# Patient Record
Sex: Female | Born: 1945
Health system: Southern US, Academic
[De-identification: ages and names within clinical notes are randomized; demographics above are authoritative.]

## PROBLEM LIST (undated history)

## (undated) ENCOUNTER — Encounter

## (undated) ENCOUNTER — Telehealth

## (undated) DIAGNOSIS — J449 Chronic obstructive pulmonary disease, unspecified: Secondary | ICD-10-CM

## (undated) DIAGNOSIS — F3289 Other specified depressive episodes: Secondary | ICD-10-CM

## (undated) DIAGNOSIS — Z972 Presence of dental prosthetic device (complete) (partial): Secondary | ICD-10-CM

## (undated) DIAGNOSIS — E785 Hyperlipidemia, unspecified: Secondary | ICD-10-CM

## (undated) DIAGNOSIS — I5032 Chronic diastolic (congestive) heart failure: Secondary | ICD-10-CM

## (undated) DIAGNOSIS — K219 Gastro-esophageal reflux disease without esophagitis: Secondary | ICD-10-CM

## (undated) DIAGNOSIS — F411 Generalized anxiety disorder: Secondary | ICD-10-CM

## (undated) DIAGNOSIS — G4754 Parasomnia in conditions classified elsewhere: Secondary | ICD-10-CM

## (undated) DIAGNOSIS — M25839 Other specified joint disorders, unspecified wrist: Secondary | ICD-10-CM

## (undated) DIAGNOSIS — K5790 Diverticulosis of intestine, part unspecified, without perforation or abscess without bleeding: Secondary | ICD-10-CM

## (undated) DIAGNOSIS — G4730 Sleep apnea, unspecified: Secondary | ICD-10-CM

## (undated) DIAGNOSIS — I951 Orthostatic hypotension: Principal | ICD-10-CM

## (undated) DIAGNOSIS — F329 Major depressive disorder, single episode, unspecified: Secondary | ICD-10-CM

## (undated) DIAGNOSIS — R296 Repeated falls: Secondary | ICD-10-CM

## (undated) DIAGNOSIS — E1140 Type 2 diabetes mellitus with diabetic neuropathy, unspecified: Secondary | ICD-10-CM

## (undated) DIAGNOSIS — R569 Unspecified convulsions: Secondary | ICD-10-CM

## (undated) DIAGNOSIS — G5601 Carpal tunnel syndrome, right upper limb: Secondary | ICD-10-CM

## (undated) DIAGNOSIS — F1911 Other psychoactive substance abuse, in remission: Secondary | ICD-10-CM

## (undated) DIAGNOSIS — I10 Essential (primary) hypertension: Secondary | ICD-10-CM

## (undated) DIAGNOSIS — E119 Type 2 diabetes mellitus without complications: Secondary | ICD-10-CM

## (undated) HISTORY — DX: Major depressive disorder, single episode, unspecified: F32.9

## (undated) HISTORY — DX: Generalized anxiety disorder: F41.1

## (undated) HISTORY — DX: Other specified depressive episodes: F32.89

## (undated) HISTORY — PX: HEMORRHOID SURGERY: SHX153

## (undated) HISTORY — DX: Parasomnia in conditions classified elsewhere: G47.54

## (undated) HISTORY — DX: Type 2 diabetes mellitus without complications: E11.9

## (undated) HISTORY — DX: Unspecified convulsions: R56.9

## (undated) HISTORY — DX: Chronic diastolic (congestive) heart failure: I50.32

## (undated) HISTORY — DX: Essential (primary) hypertension: I10

## (undated) HISTORY — DX: Gastro-esophageal reflux disease without esophagitis: K21.9

## (undated) HISTORY — PX: VAGINAL HYSTERECTOMY: SUR661

---

## 1969-07-23 HISTORY — PX: APPENDECTOMY: SHX54

## 1999-11-23 DIAGNOSIS — E119 Type 2 diabetes mellitus without complications: Secondary | ICD-10-CM

## 1999-11-23 HISTORY — DX: Type 2 diabetes mellitus without complications: E11.9

## 2000-08-16 ENCOUNTER — Encounter: Admission: RE | Admit: 2000-08-16 | Discharge: 2000-11-14 | Payer: Self-pay | Admitting: Family Medicine

## 2000-10-26 ENCOUNTER — Encounter: Payer: Self-pay | Admitting: Surgery

## 2000-10-26 ENCOUNTER — Observation Stay (HOSPITAL_COMMUNITY): Admission: RE | Admit: 2000-10-26 | Discharge: 2000-10-26 | Payer: Self-pay | Admitting: Surgery

## 2000-10-26 ENCOUNTER — Encounter (INDEPENDENT_AMBULATORY_CARE_PROVIDER_SITE_OTHER): Payer: Self-pay | Admitting: Specialist

## 2000-10-26 HISTORY — PX: CHOLECYSTECTOMY: SHX55

## 2002-08-03 ENCOUNTER — Emergency Department (HOSPITAL_COMMUNITY): Admission: EM | Admit: 2002-08-03 | Discharge: 2002-08-04 | Payer: Self-pay | Admitting: Emergency Medicine

## 2002-08-03 ENCOUNTER — Encounter: Payer: Self-pay | Admitting: Emergency Medicine

## 2002-08-04 ENCOUNTER — Encounter: Payer: Self-pay | Admitting: Emergency Medicine

## 2002-08-08 ENCOUNTER — Encounter (INDEPENDENT_AMBULATORY_CARE_PROVIDER_SITE_OTHER): Payer: Self-pay | Admitting: *Deleted

## 2002-08-08 ENCOUNTER — Ambulatory Visit (HOSPITAL_BASED_OUTPATIENT_CLINIC_OR_DEPARTMENT_OTHER): Admission: RE | Admit: 2002-08-08 | Discharge: 2002-08-08 | Payer: Self-pay | Admitting: Surgery

## 2003-05-02 ENCOUNTER — Encounter: Payer: Self-pay | Admitting: Internal Medicine

## 2003-06-13 ENCOUNTER — Encounter: Payer: Self-pay | Admitting: Internal Medicine

## 2004-01-30 ENCOUNTER — Encounter: Admission: RE | Admit: 2004-01-30 | Discharge: 2004-01-30 | Payer: Self-pay | Admitting: Infectious Diseases

## 2004-02-13 ENCOUNTER — Encounter: Admission: RE | Admit: 2004-02-13 | Discharge: 2004-02-13 | Payer: Self-pay | Admitting: Infectious Diseases

## 2004-02-26 ENCOUNTER — Encounter: Admission: RE | Admit: 2004-02-26 | Discharge: 2004-02-26 | Payer: Self-pay | Admitting: Infectious Diseases

## 2004-05-20 ENCOUNTER — Other Ambulatory Visit (HOSPITAL_COMMUNITY): Admission: RE | Admit: 2004-05-20 | Discharge: 2004-06-18 | Payer: Self-pay | Admitting: Psychiatry

## 2004-08-16 ENCOUNTER — Emergency Department (HOSPITAL_COMMUNITY): Admission: EM | Admit: 2004-08-16 | Discharge: 2004-08-16 | Payer: Self-pay | Admitting: Emergency Medicine

## 2004-08-31 ENCOUNTER — Ambulatory Visit (HOSPITAL_BASED_OUTPATIENT_CLINIC_OR_DEPARTMENT_OTHER): Admission: RE | Admit: 2004-08-31 | Discharge: 2004-08-31 | Payer: Self-pay | Admitting: Emergency Medicine

## 2007-04-06 ENCOUNTER — Encounter: Payer: Self-pay | Admitting: Internal Medicine

## 2007-07-02 ENCOUNTER — Encounter (INDEPENDENT_AMBULATORY_CARE_PROVIDER_SITE_OTHER): Payer: Self-pay | Admitting: *Deleted

## 2007-08-09 ENCOUNTER — Ambulatory Visit: Payer: Self-pay | Admitting: Internal Medicine

## 2007-08-09 DIAGNOSIS — F1011 Alcohol abuse, in remission: Secondary | ICD-10-CM | POA: Insufficient documentation

## 2007-08-09 DIAGNOSIS — R05 Cough: Secondary | ICD-10-CM

## 2007-08-09 DIAGNOSIS — E785 Hyperlipidemia, unspecified: Secondary | ICD-10-CM | POA: Insufficient documentation

## 2007-08-09 DIAGNOSIS — R569 Unspecified convulsions: Secondary | ICD-10-CM

## 2007-08-09 DIAGNOSIS — E119 Type 2 diabetes mellitus without complications: Secondary | ICD-10-CM | POA: Insufficient documentation

## 2007-08-09 DIAGNOSIS — K219 Gastro-esophageal reflux disease without esophagitis: Secondary | ICD-10-CM

## 2007-08-09 DIAGNOSIS — F329 Major depressive disorder, single episode, unspecified: Secondary | ICD-10-CM | POA: Insufficient documentation

## 2007-08-09 DIAGNOSIS — F17200 Nicotine dependence, unspecified, uncomplicated: Secondary | ICD-10-CM | POA: Insufficient documentation

## 2007-08-09 DIAGNOSIS — Z8719 Personal history of other diseases of the digestive system: Secondary | ICD-10-CM | POA: Insufficient documentation

## 2007-08-09 DIAGNOSIS — R030 Elevated blood-pressure reading, without diagnosis of hypertension: Secondary | ICD-10-CM

## 2007-08-09 DIAGNOSIS — J309 Allergic rhinitis, unspecified: Secondary | ICD-10-CM | POA: Insufficient documentation

## 2007-08-21 ENCOUNTER — Encounter: Payer: Self-pay | Admitting: Internal Medicine

## 2007-08-23 ENCOUNTER — Telehealth: Payer: Self-pay | Admitting: Internal Medicine

## 2007-08-23 ENCOUNTER — Telehealth: Payer: Self-pay | Admitting: *Deleted

## 2007-08-24 ENCOUNTER — Ambulatory Visit: Payer: Self-pay | Admitting: Internal Medicine

## 2007-09-04 LAB — CONVERTED CEMR LAB
ALT: 27 U/L (ref 0–35)
AST: 45 U/L — ABNORMAL HIGH (ref 0–37)
Albumin: 3.5 g/dL (ref 3.5–5.2)
Alkaline Phosphatase: 135 U/L — ABNORMAL HIGH (ref 39–117)
BUN: 8 mg/dL (ref 6–23)
Basophils Absolute: 0.1 K/uL (ref 0.0–0.1)
Basophils Relative: 0.7 % (ref 0.0–1.0)
Bilirubin, Direct: 0.2 mg/dL (ref 0.0–0.3)
CO2: 27 meq/L (ref 19–32)
Calcium: 9.4 mg/dL (ref 8.4–10.5)
Chloride: 104 meq/L (ref 96–112)
Cholesterol: 256 mg/dL (ref 0–200)
Creatinine, Ser: 0.8 mg/dL (ref 0.4–1.2)
Direct LDL: 186.1 mg/dL
Eosinophils Absolute: 0.2 K/uL (ref 0.0–0.6)
Eosinophils Relative: 1.2 % (ref 0.0–5.0)
GFR calc Af Amer: 93 mL/min
GFR calc non Af Amer: 77 mL/min
Glucose, Bld: 200 mg/dL — ABNORMAL HIGH (ref 70–99)
HCT: 36.6 % (ref 36.0–46.0)
HDL: 36.6 mg/dL — ABNORMAL LOW (ref >39.0)
Hemoglobin: 12.6 g/dL (ref 12.0–15.0)
Hgb A1c MFr Bld: 9.3 % — ABNORMAL HIGH (ref 4.6–6.0)
Lymphocytes Relative: 24.2 % (ref 12.0–46.0)
MCHC: 34.5 g/dL (ref 30.0–36.0)
MCV: 83.9 fL (ref 78.0–100.0)
Monocytes Absolute: 0.7 K/uL (ref 0.2–0.7)
Monocytes Relative: 4.8 % (ref 3.0–11.0)
Neutro Abs: 9.7 K/uL — ABNORMAL HIGH (ref 1.4–7.7)
Neutrophils Relative %: 69.1 % (ref 43.0–77.0)
Platelets: 334 K/uL (ref 150–400)
Potassium: 4.3 meq/L (ref 3.5–5.1)
RBC: 4.36 M/uL (ref 3.87–5.11)
RDW: 14.2 % (ref 11.5–14.6)
Sodium: 142 meq/L (ref 135–145)
TSH: 1.19 u[IU]/mL (ref 0.35–5.50)
Total Bilirubin: 0.7 mg/dL (ref 0.3–1.2)
Total CHOL/HDL Ratio: 7
Total Protein: 6.5 g/dL (ref 6.0–8.3)
Triglycerides: 254 mg/dL (ref 0–149)
VLDL: 51 mg/dL — ABNORMAL HIGH (ref 0–40)
WBC: 14.1 10*3/microliter — ABNORMAL HIGH (ref 4.5–10.5)

## 2007-09-07 ENCOUNTER — Ambulatory Visit: Payer: Self-pay | Admitting: Internal Medicine

## 2007-09-07 DIAGNOSIS — I10 Essential (primary) hypertension: Secondary | ICD-10-CM | POA: Insufficient documentation

## 2007-09-08 ENCOUNTER — Telehealth: Payer: Self-pay | Admitting: *Deleted

## 2007-09-11 ENCOUNTER — Encounter: Payer: Self-pay | Admitting: Internal Medicine

## 2007-09-12 ENCOUNTER — Telehealth: Payer: Self-pay | Admitting: *Deleted

## 2007-09-15 ENCOUNTER — Telehealth: Payer: Self-pay | Admitting: Internal Medicine

## 2007-09-20 ENCOUNTER — Ambulatory Visit: Payer: Self-pay | Admitting: Internal Medicine

## 2007-10-03 ENCOUNTER — Telehealth: Payer: Self-pay | Admitting: *Deleted

## 2007-10-18 ENCOUNTER — Encounter: Payer: Self-pay | Admitting: Internal Medicine

## 2007-10-24 ENCOUNTER — Encounter: Payer: Self-pay | Admitting: Internal Medicine

## 2007-11-11 ENCOUNTER — Ambulatory Visit: Payer: Self-pay | Admitting: Internal Medicine

## 2007-12-04 ENCOUNTER — Encounter: Payer: Self-pay | Admitting: Internal Medicine

## 2007-12-05 ENCOUNTER — Telehealth: Payer: Self-pay | Admitting: Internal Medicine

## 2007-12-14 ENCOUNTER — Ambulatory Visit: Payer: Self-pay | Admitting: Internal Medicine

## 2007-12-17 LAB — CONVERTED CEMR LAB
HDL: 35.4 mg/dL — ABNORMAL LOW (ref >39.0)
Hgb A1c MFr Bld: 9.5 % — ABNORMAL HIGH (ref 4.6–6.0)
VLDL: 31 mg/dL (ref 0–40)

## 2007-12-19 ENCOUNTER — Telehealth: Payer: Self-pay | Admitting: Internal Medicine

## 2007-12-20 ENCOUNTER — Telehealth: Payer: Self-pay | Admitting: Internal Medicine

## 2008-01-03 ENCOUNTER — Telehealth: Payer: Self-pay | Admitting: Internal Medicine

## 2008-01-05 ENCOUNTER — Telehealth: Payer: Self-pay | Admitting: Internal Medicine

## 2008-01-09 ENCOUNTER — Telehealth: Payer: Self-pay | Admitting: Internal Medicine

## 2008-02-06 ENCOUNTER — Encounter: Payer: Self-pay | Admitting: Internal Medicine

## 2008-02-22 ENCOUNTER — Ambulatory Visit: Payer: Self-pay | Admitting: Internal Medicine

## 2008-02-22 DIAGNOSIS — J4489 Other specified chronic obstructive pulmonary disease: Secondary | ICD-10-CM | POA: Insufficient documentation

## 2008-02-22 DIAGNOSIS — R5381 Other malaise: Secondary | ICD-10-CM

## 2008-02-22 DIAGNOSIS — J449 Chronic obstructive pulmonary disease, unspecified: Secondary | ICD-10-CM

## 2008-02-22 DIAGNOSIS — R5383 Other fatigue: Secondary | ICD-10-CM

## 2008-03-04 ENCOUNTER — Telehealth: Payer: Self-pay | Admitting: *Deleted

## 2008-03-05 ENCOUNTER — Ambulatory Visit: Payer: Self-pay | Admitting: Internal Medicine

## 2008-03-05 LAB — CONVERTED CEMR LAB
BUN: 9 mg/dL (ref 6–23)
Basophils Relative: 0.7 % (ref 0.0–1.0)
CO2: 29 meq/L (ref 19–32)
Chloride: 99 meq/L (ref 96–112)
Creatinine, Ser: 0.7 mg/dL (ref 0.4–1.2)
Eosinophils Relative: 1.5 % (ref 0.0–5.0)
Glucose, Bld: 234 mg/dL — ABNORMAL HIGH (ref 70–99)
Lymphocytes Relative: 30.8 % (ref 12.0–46.0)
MCV: 84.9 fL (ref 78.0–100.0)
Neutrophils Relative %: 62.9 % (ref 43.0–77.0)
RBC: 4.77 M/uL (ref 3.87–5.11)
WBC: 14.9 10*3/uL — ABNORMAL HIGH (ref 4.5–10.5)

## 2008-03-25 ENCOUNTER — Telehealth: Payer: Self-pay | Admitting: *Deleted

## 2008-03-27 ENCOUNTER — Telehealth: Payer: Self-pay | Admitting: *Deleted

## 2008-03-29 ENCOUNTER — Telehealth: Payer: Self-pay | Admitting: *Deleted

## 2008-04-01 ENCOUNTER — Telehealth: Payer: Self-pay | Admitting: *Deleted

## 2008-04-03 ENCOUNTER — Telehealth: Payer: Self-pay | Admitting: *Deleted

## 2008-04-08 ENCOUNTER — Telehealth: Payer: Self-pay | Admitting: Internal Medicine

## 2008-04-25 ENCOUNTER — Telehealth: Payer: Self-pay | Admitting: *Deleted

## 2008-05-30 ENCOUNTER — Telehealth: Payer: Self-pay | Admitting: *Deleted

## 2008-06-20 ENCOUNTER — Telehealth: Payer: Self-pay | Admitting: *Deleted

## 2008-06-24 ENCOUNTER — Telehealth: Payer: Self-pay | Admitting: *Deleted

## 2008-07-10 ENCOUNTER — Ambulatory Visit: Payer: Self-pay | Admitting: Family Medicine

## 2008-07-11 ENCOUNTER — Encounter: Payer: Self-pay | Admitting: Internal Medicine

## 2008-07-15 ENCOUNTER — Telehealth: Payer: Self-pay | Admitting: *Deleted

## 2008-07-16 ENCOUNTER — Telehealth: Payer: Self-pay | Admitting: Internal Medicine

## 2008-07-17 ENCOUNTER — Ambulatory Visit: Payer: Self-pay | Admitting: Internal Medicine

## 2008-07-18 ENCOUNTER — Telehealth: Payer: Self-pay | Admitting: *Deleted

## 2008-07-19 LAB — CONVERTED CEMR LAB
Basophils Absolute: 0.1 10*3/uL (ref 0.0–0.1)
Bilirubin, Direct: 0.1 mg/dL (ref 0.0–0.3)
Calcium: 8.9 mg/dL (ref 8.4–10.5)
Creatinine, Ser: 0.7 mg/dL (ref 0.4–1.2)
GFR calc Af Amer: 109 mL/min
Glucose, Bld: 281 mg/dL — ABNORMAL HIGH (ref 70–99)
HCT: 35.7 % — ABNORMAL LOW (ref 36.0–46.0)
Hemoglobin: 12.1 g/dL (ref 12.0–15.0)
MCHC: 33.8 g/dL (ref 30.0–36.0)
Monocytes Absolute: 0.4 10*3/uL (ref 0.1–1.0)
Monocytes Relative: 3.3 % (ref 3.0–12.0)
Neutro Abs: 6.5 10*3/uL (ref 1.4–7.7)
RDW: 14 % (ref 11.5–14.6)
Sed Rate: 13 mm/hr (ref 0–22)
Sodium: 141 meq/L (ref 135–145)
Total Protein: 6.4 g/dL (ref 6.0–8.3)

## 2008-07-20 ENCOUNTER — Encounter: Payer: Self-pay | Admitting: Internal Medicine

## 2008-07-24 ENCOUNTER — Telehealth: Payer: Self-pay | Admitting: *Deleted

## 2008-07-26 ENCOUNTER — Ambulatory Visit: Payer: Self-pay | Admitting: Internal Medicine

## 2008-07-26 ENCOUNTER — Ambulatory Visit (HOSPITAL_COMMUNITY): Admission: RE | Admit: 2008-07-26 | Discharge: 2008-07-26 | Payer: Self-pay | Admitting: Internal Medicine

## 2008-07-26 ENCOUNTER — Telehealth: Payer: Self-pay | Admitting: Internal Medicine

## 2008-07-31 ENCOUNTER — Telehealth: Payer: Self-pay | Admitting: *Deleted

## 2008-07-31 ENCOUNTER — Telehealth: Payer: Self-pay | Admitting: Internal Medicine

## 2008-08-01 ENCOUNTER — Ambulatory Visit: Payer: Self-pay | Admitting: Internal Medicine

## 2008-08-01 ENCOUNTER — Telehealth (INDEPENDENT_AMBULATORY_CARE_PROVIDER_SITE_OTHER): Payer: Self-pay

## 2008-08-01 LAB — CONVERTED CEMR LAB
Crystals: NEGATIVE
Leukocytes, UA: NEGATIVE
Nitrite: NEGATIVE
Total Protein, Urine: NEGATIVE mg/dL
Urobilinogen, UA: 0.2 (ref 0.0–1.0)

## 2008-08-02 ENCOUNTER — Encounter: Payer: Self-pay | Admitting: Gastroenterology

## 2008-08-02 ENCOUNTER — Telehealth: Payer: Self-pay | Admitting: *Deleted

## 2008-08-05 ENCOUNTER — Ambulatory Visit (HOSPITAL_COMMUNITY): Admission: RE | Admit: 2008-08-05 | Discharge: 2008-08-05 | Payer: Self-pay | Admitting: Gastroenterology

## 2008-08-05 ENCOUNTER — Telehealth: Payer: Self-pay | Admitting: Internal Medicine

## 2008-08-06 ENCOUNTER — Encounter: Payer: Self-pay | Admitting: Gastroenterology

## 2008-08-21 ENCOUNTER — Ambulatory Visit: Payer: Self-pay | Admitting: Internal Medicine

## 2008-08-21 ENCOUNTER — Telehealth: Payer: Self-pay | Admitting: *Deleted

## 2008-08-28 ENCOUNTER — Telehealth: Payer: Self-pay | Admitting: *Deleted

## 2008-08-29 ENCOUNTER — Encounter: Payer: Self-pay | Admitting: Internal Medicine

## 2008-08-29 ENCOUNTER — Ambulatory Visit: Payer: Self-pay | Admitting: Internal Medicine

## 2008-08-29 LAB — HM COLONOSCOPY

## 2008-09-09 ENCOUNTER — Telehealth: Payer: Self-pay | Admitting: *Deleted

## 2008-09-09 ENCOUNTER — Encounter: Payer: Self-pay | Admitting: Internal Medicine

## 2008-09-30 ENCOUNTER — Telehealth: Payer: Self-pay | Admitting: Internal Medicine

## 2008-09-30 ENCOUNTER — Ambulatory Visit: Payer: Self-pay | Admitting: Internal Medicine

## 2008-09-30 DIAGNOSIS — R1084 Generalized abdominal pain: Secondary | ICD-10-CM | POA: Insufficient documentation

## 2008-09-30 DIAGNOSIS — K589 Irritable bowel syndrome without diarrhea: Secondary | ICD-10-CM | POA: Insufficient documentation

## 2008-10-03 ENCOUNTER — Encounter: Payer: Self-pay | Admitting: Internal Medicine

## 2008-10-04 ENCOUNTER — Encounter: Payer: Self-pay | Admitting: Internal Medicine

## 2008-10-07 ENCOUNTER — Ambulatory Visit: Payer: Self-pay | Admitting: Internal Medicine

## 2008-10-07 DIAGNOSIS — Z860100 Personal history of colon polyps, unspecified: Secondary | ICD-10-CM | POA: Insufficient documentation

## 2008-10-07 DIAGNOSIS — Z9283 Personal history of failed moderate sedation: Secondary | ICD-10-CM | POA: Insufficient documentation

## 2008-10-07 DIAGNOSIS — K5730 Diverticulosis of large intestine without perforation or abscess without bleeding: Secondary | ICD-10-CM | POA: Insufficient documentation

## 2008-10-08 LAB — CONVERTED CEMR LAB: 5-HIAA, 24 Hr Urine: 11.6 mg/(24.h) — ABNORMAL HIGH (ref <=6.0)

## 2008-10-10 ENCOUNTER — Telehealth: Payer: Self-pay | Admitting: Internal Medicine

## 2008-10-15 ENCOUNTER — Ambulatory Visit: Payer: Self-pay | Admitting: Internal Medicine

## 2008-10-15 ENCOUNTER — Encounter: Payer: Self-pay | Admitting: Internal Medicine

## 2008-10-22 ENCOUNTER — Telehealth: Payer: Self-pay | Admitting: Internal Medicine

## 2008-10-30 ENCOUNTER — Encounter (HOSPITAL_COMMUNITY): Admission: RE | Admit: 2008-10-30 | Discharge: 2008-11-21 | Payer: Self-pay | Admitting: Internal Medicine

## 2008-11-05 ENCOUNTER — Ambulatory Visit: Payer: Self-pay | Admitting: Internal Medicine

## 2008-11-08 ENCOUNTER — Encounter: Payer: Self-pay | Admitting: Internal Medicine

## 2008-11-08 LAB — CONVERTED CEMR LAB: 5-HIAA, 24 Hr Urine: 5.9 mg/(24.h) (ref <=6.0)

## 2008-11-18 ENCOUNTER — Telehealth: Payer: Self-pay | Admitting: Internal Medicine

## 2008-11-19 ENCOUNTER — Telehealth: Payer: Self-pay | Admitting: Internal Medicine

## 2008-11-19 ENCOUNTER — Ambulatory Visit: Payer: Self-pay | Admitting: Internal Medicine

## 2008-11-19 DIAGNOSIS — R112 Nausea with vomiting, unspecified: Secondary | ICD-10-CM | POA: Insufficient documentation

## 2008-11-19 DIAGNOSIS — R197 Diarrhea, unspecified: Secondary | ICD-10-CM | POA: Insufficient documentation

## 2008-11-20 LAB — CONVERTED CEMR LAB
Albumin: 3.7 g/dL (ref 3.5–5.2)
CO2: 29 meq/L (ref 19–32)
Calcium: 9.4 mg/dL (ref 8.4–10.5)
Chloride: 97 meq/L (ref 96–112)
GFR calc Af Amer: 160 mL/min
GFR calc non Af Amer: 132 mL/min
Glucose, Bld: 228 mg/dL — ABNORMAL HIGH (ref 70–99)
Hemoglobin: 13 g/dL (ref 12.0–15.0)
Lymphocytes Relative: 39.5 % (ref 12.0–46.0)
Monocytes Relative: 4.7 % (ref 3.0–12.0)
Neutro Abs: 6.7 10*3/uL (ref 1.4–7.7)
Neutrophils Relative %: 53.8 % (ref 43.0–77.0)
RBC: 4.69 M/uL (ref 3.87–5.11)
RDW: 13.9 % (ref 11.5–14.6)
Sodium: 137 meq/L (ref 135–145)
Total Bilirubin: 0.5 mg/dL (ref 0.3–1.2)
Total Protein: 6.7 g/dL (ref 6.0–8.3)

## 2008-11-27 ENCOUNTER — Telehealth: Payer: Self-pay | Admitting: Internal Medicine

## 2008-11-28 ENCOUNTER — Ambulatory Visit (HOSPITAL_COMMUNITY): Admission: RE | Admit: 2008-11-28 | Discharge: 2008-11-28 | Payer: Self-pay | Admitting: Internal Medicine

## 2008-12-02 ENCOUNTER — Ambulatory Visit: Payer: Self-pay | Admitting: Internal Medicine

## 2008-12-02 LAB — CONVERTED CEMR LAB
Basophils Absolute: 0.1 10*3/uL (ref 0.0–0.1)
CRP, High Sensitivity: 13 — ABNORMAL HIGH (ref 0.00–5.00)
Eosinophils Absolute: 0.2 10*3/uL (ref 0.0–0.7)
HCT: 38.1 % (ref 36.0–46.0)
MCHC: 33.6 g/dL (ref 30.0–36.0)
MCV: 84.3 fL (ref 78.0–100.0)
Monocytes Absolute: 0.6 10*3/uL (ref 0.1–1.0)
Neutrophils Relative %: 65 % (ref 43.0–77.0)
Platelets: 291 10*3/uL (ref 150–400)
RDW: 14 % (ref 11.5–14.6)
WBC: 12 10*3/uL — ABNORMAL HIGH (ref 4.5–10.5)

## 2008-12-03 ENCOUNTER — Ambulatory Visit: Payer: Self-pay | Admitting: Internal Medicine

## 2008-12-17 ENCOUNTER — Telehealth: Payer: Self-pay | Admitting: Internal Medicine

## 2008-12-17 ENCOUNTER — Emergency Department (HOSPITAL_COMMUNITY): Admission: EM | Admit: 2008-12-17 | Discharge: 2008-12-17 | Payer: Self-pay | Admitting: Emergency Medicine

## 2008-12-18 ENCOUNTER — Ambulatory Visit: Payer: Self-pay | Admitting: Internal Medicine

## 2008-12-18 ENCOUNTER — Telehealth: Payer: Self-pay | Admitting: Internal Medicine

## 2008-12-18 DIAGNOSIS — F1910 Other psychoactive substance abuse, uncomplicated: Secondary | ICD-10-CM

## 2008-12-18 LAB — CONVERTED CEMR LAB: CRP, High Sensitivity: 18 — ABNORMAL HIGH (ref 0.00–5.00)

## 2008-12-19 ENCOUNTER — Telehealth: Payer: Self-pay | Admitting: *Deleted

## 2008-12-23 ENCOUNTER — Ambulatory Visit: Payer: Self-pay | Admitting: Cardiology

## 2008-12-27 ENCOUNTER — Telehealth: Payer: Self-pay | Admitting: Internal Medicine

## 2008-12-30 ENCOUNTER — Telehealth: Payer: Self-pay | Admitting: Internal Medicine

## 2008-12-31 ENCOUNTER — Encounter: Payer: Self-pay | Admitting: Internal Medicine

## 2008-12-31 ENCOUNTER — Telehealth: Payer: Self-pay | Admitting: Internal Medicine

## 2008-12-31 ENCOUNTER — Ambulatory Visit: Payer: Self-pay | Admitting: Internal Medicine

## 2008-12-31 LAB — CONVERTED CEMR LAB: Blood Glucose, Fingerstick: 241

## 2009-01-06 ENCOUNTER — Telehealth: Payer: Self-pay | Admitting: Internal Medicine

## 2009-01-13 ENCOUNTER — Telehealth: Payer: Self-pay | Admitting: Internal Medicine

## 2009-01-14 ENCOUNTER — Telehealth: Payer: Self-pay | Admitting: Internal Medicine

## 2009-01-20 ENCOUNTER — Ambulatory Visit: Payer: Self-pay | Admitting: Internal Medicine

## 2009-01-20 ENCOUNTER — Telehealth: Payer: Self-pay | Admitting: Internal Medicine

## 2009-01-20 LAB — HM DIABETES EYE EXAM

## 2009-01-21 ENCOUNTER — Ambulatory Visit: Payer: Self-pay | Admitting: Internal Medicine

## 2009-01-29 ENCOUNTER — Telehealth: Payer: Self-pay | Admitting: Internal Medicine

## 2009-02-03 ENCOUNTER — Telehealth: Payer: Self-pay | Admitting: Internal Medicine

## 2009-02-04 ENCOUNTER — Encounter: Payer: Self-pay | Admitting: Internal Medicine

## 2009-02-05 ENCOUNTER — Telehealth: Payer: Self-pay | Admitting: Internal Medicine

## 2009-02-12 ENCOUNTER — Telehealth: Payer: Self-pay | Admitting: *Deleted

## 2009-02-13 ENCOUNTER — Telehealth: Payer: Self-pay | Admitting: *Deleted

## 2009-02-14 ENCOUNTER — Encounter: Admission: RE | Admit: 2009-02-14 | Discharge: 2009-02-14 | Payer: Self-pay | Admitting: Neurology

## 2009-02-27 ENCOUNTER — Ambulatory Visit: Payer: Self-pay | Admitting: Internal Medicine

## 2009-02-27 LAB — CONVERTED CEMR LAB
Nitrite: NEGATIVE
Protein, U semiquant: NEGATIVE
Specific Gravity, Urine: 1.02
Urobilinogen, UA: 0.2
pH: 5

## 2009-03-24 ENCOUNTER — Ambulatory Visit: Payer: Self-pay | Admitting: Internal Medicine

## 2009-03-26 LAB — CONVERTED CEMR LAB
AST: 44 units/L — ABNORMAL HIGH (ref 0–37)
Albumin: 3.7 g/dL (ref 3.5–5.2)
Alkaline Phosphatase: 103 units/L (ref 39–117)
Calcium: 9.4 mg/dL (ref 8.4–10.5)
GFR calc non Af Amer: 89.37 mL/min (ref >60)
Glucose, Bld: 161 mg/dL — ABNORMAL HIGH (ref 70–99)
LDL Cholesterol: 95 mg/dL (ref 0–99)
Microalb Creat Ratio: 3.9 mg/g (ref 0.0–30.0)
Microalb, Ur: 0.7 mg/dL (ref 0.0–1.9)
Potassium: 4.3 meq/L (ref 3.5–5.1)
Sodium: 140 meq/L (ref 135–145)
Total Bilirubin: 0.6 mg/dL (ref 0.3–1.2)
Total CHOL/HDL Ratio: 4
Triglycerides: 171 mg/dL — ABNORMAL HIGH (ref 0.0–149.0)

## 2009-04-01 ENCOUNTER — Ambulatory Visit: Payer: Self-pay | Admitting: Endocrinology

## 2009-04-02 ENCOUNTER — Emergency Department (HOSPITAL_COMMUNITY): Admission: EM | Admit: 2009-04-02 | Discharge: 2009-04-02 | Payer: Self-pay | Admitting: Emergency Medicine

## 2009-04-02 ENCOUNTER — Encounter: Payer: Self-pay | Admitting: Internal Medicine

## 2009-04-03 ENCOUNTER — Telehealth: Payer: Self-pay | Admitting: Internal Medicine

## 2009-04-08 ENCOUNTER — Telehealth: Payer: Self-pay | Admitting: Internal Medicine

## 2009-04-09 ENCOUNTER — Telehealth: Payer: Self-pay | Admitting: *Deleted

## 2009-04-14 ENCOUNTER — Telehealth: Payer: Self-pay | Admitting: Internal Medicine

## 2009-04-17 ENCOUNTER — Ambulatory Visit: Payer: Self-pay | Admitting: Endocrinology

## 2009-04-18 ENCOUNTER — Ambulatory Visit: Payer: Self-pay | Admitting: Internal Medicine

## 2009-04-18 DIAGNOSIS — R109 Unspecified abdominal pain: Secondary | ICD-10-CM | POA: Insufficient documentation

## 2009-04-22 ENCOUNTER — Telehealth: Payer: Self-pay | Admitting: *Deleted

## 2009-04-25 ENCOUNTER — Telehealth: Payer: Self-pay | Admitting: Internal Medicine

## 2009-04-28 ENCOUNTER — Encounter: Payer: Self-pay | Admitting: Internal Medicine

## 2009-05-05 ENCOUNTER — Ambulatory Visit: Payer: Self-pay | Admitting: Family Medicine

## 2009-05-05 DIAGNOSIS — J42 Unspecified chronic bronchitis: Secondary | ICD-10-CM | POA: Insufficient documentation

## 2009-05-13 ENCOUNTER — Telehealth: Payer: Self-pay | Admitting: Internal Medicine

## 2009-05-15 ENCOUNTER — Encounter: Payer: Self-pay | Admitting: Internal Medicine

## 2009-05-19 ENCOUNTER — Telehealth: Payer: Self-pay | Admitting: Internal Medicine

## 2009-05-27 ENCOUNTER — Telehealth (INDEPENDENT_AMBULATORY_CARE_PROVIDER_SITE_OTHER): Payer: Self-pay | Admitting: *Deleted

## 2009-05-27 ENCOUNTER — Ambulatory Visit: Payer: Self-pay | Admitting: Endocrinology

## 2009-05-28 ENCOUNTER — Telehealth: Payer: Self-pay | Admitting: Internal Medicine

## 2009-05-29 ENCOUNTER — Ambulatory Visit: Payer: Self-pay | Admitting: Endocrinology

## 2009-06-04 LAB — CONVERTED CEMR LAB
C-Peptide: 1.13 ng/mL (ref 0.80–3.90)
Pancreatic Islet Cell Antibody: <5 (ref <5)

## 2009-06-10 ENCOUNTER — Telehealth: Payer: Self-pay | Admitting: Internal Medicine

## 2009-06-15 ENCOUNTER — Emergency Department (HOSPITAL_COMMUNITY): Admission: EM | Admit: 2009-06-15 | Discharge: 2009-06-15 | Payer: Self-pay | Admitting: Emergency Medicine

## 2009-06-16 ENCOUNTER — Telehealth: Payer: Self-pay | Admitting: *Deleted

## 2009-06-16 ENCOUNTER — Telehealth: Payer: Self-pay | Admitting: Family Medicine

## 2009-06-17 ENCOUNTER — Encounter: Payer: Self-pay | Admitting: Internal Medicine

## 2009-06-17 ENCOUNTER — Telehealth: Payer: Self-pay | Admitting: Internal Medicine

## 2009-06-24 ENCOUNTER — Telehealth: Payer: Self-pay | Admitting: Internal Medicine

## 2009-06-24 ENCOUNTER — Telehealth (INDEPENDENT_AMBULATORY_CARE_PROVIDER_SITE_OTHER): Payer: Self-pay | Admitting: *Deleted

## 2009-06-26 ENCOUNTER — Telehealth: Payer: Self-pay | Admitting: Internal Medicine

## 2009-06-30 ENCOUNTER — Telehealth: Payer: Self-pay | Admitting: Internal Medicine

## 2009-07-01 ENCOUNTER — Ambulatory Visit: Payer: Self-pay | Admitting: Family Medicine

## 2009-07-01 ENCOUNTER — Telehealth: Payer: Self-pay | Admitting: *Deleted

## 2009-07-01 DIAGNOSIS — S61209A Unspecified open wound of unspecified finger without damage to nail, initial encounter: Secondary | ICD-10-CM | POA: Insufficient documentation

## 2009-07-04 ENCOUNTER — Ambulatory Visit: Payer: Self-pay | Admitting: Internal Medicine

## 2009-07-08 ENCOUNTER — Ambulatory Visit: Payer: Self-pay | Admitting: Endocrinology

## 2009-07-09 ENCOUNTER — Telehealth: Payer: Self-pay | Admitting: *Deleted

## 2009-07-10 ENCOUNTER — Telehealth (INDEPENDENT_AMBULATORY_CARE_PROVIDER_SITE_OTHER): Payer: Self-pay | Admitting: *Deleted

## 2009-07-14 ENCOUNTER — Emergency Department (HOSPITAL_COMMUNITY): Admission: EM | Admit: 2009-07-14 | Discharge: 2009-07-14 | Payer: Self-pay | Admitting: Emergency Medicine

## 2009-07-21 ENCOUNTER — Telehealth: Payer: Self-pay | Admitting: Internal Medicine

## 2009-08-11 ENCOUNTER — Telehealth: Payer: Self-pay | Admitting: *Deleted

## 2009-08-16 ENCOUNTER — Encounter: Payer: Self-pay | Admitting: Internal Medicine

## 2009-08-21 ENCOUNTER — Encounter: Payer: Self-pay | Admitting: *Deleted

## 2009-09-05 ENCOUNTER — Emergency Department (HOSPITAL_COMMUNITY): Admission: EM | Admit: 2009-09-05 | Discharge: 2009-09-05 | Payer: Self-pay | Admitting: Emergency Medicine

## 2009-09-05 ENCOUNTER — Telehealth: Payer: Self-pay | Admitting: Internal Medicine

## 2009-10-10 ENCOUNTER — Emergency Department (HOSPITAL_COMMUNITY): Admission: EM | Admit: 2009-10-10 | Discharge: 2009-10-10 | Payer: Self-pay | Admitting: Emergency Medicine

## 2009-12-22 ENCOUNTER — Ambulatory Visit: Payer: Self-pay | Admitting: Internal Medicine

## 2009-12-22 DIAGNOSIS — Z9189 Other specified personal risk factors, not elsewhere classified: Secondary | ICD-10-CM

## 2009-12-22 DIAGNOSIS — J209 Acute bronchitis, unspecified: Secondary | ICD-10-CM | POA: Insufficient documentation

## 2009-12-22 LAB — HM DIABETES FOOT EXAM

## 2009-12-26 ENCOUNTER — Ambulatory Visit: Payer: Self-pay | Admitting: Internal Medicine

## 2009-12-26 ENCOUNTER — Telehealth: Payer: Self-pay | Admitting: Internal Medicine

## 2009-12-29 ENCOUNTER — Emergency Department (HOSPITAL_COMMUNITY): Admission: EM | Admit: 2009-12-29 | Discharge: 2009-12-29 | Payer: Self-pay | Admitting: Emergency Medicine

## 2009-12-29 ENCOUNTER — Telehealth: Payer: Self-pay | Admitting: Internal Medicine

## 2010-01-01 ENCOUNTER — Telehealth: Payer: Self-pay | Admitting: Internal Medicine

## 2010-01-09 ENCOUNTER — Telehealth: Payer: Self-pay | Admitting: *Deleted

## 2010-01-14 ENCOUNTER — Encounter: Payer: Self-pay | Admitting: Internal Medicine

## 2010-01-19 ENCOUNTER — Ambulatory Visit: Payer: Self-pay | Admitting: Internal Medicine

## 2010-01-22 ENCOUNTER — Ambulatory Visit: Payer: Self-pay | Admitting: Family Medicine

## 2010-01-22 ENCOUNTER — Telehealth: Payer: Self-pay | Admitting: Internal Medicine

## 2010-01-26 ENCOUNTER — Encounter: Payer: Self-pay | Admitting: Cardiology

## 2010-01-27 ENCOUNTER — Encounter: Admission: RE | Admit: 2010-01-27 | Discharge: 2010-01-27 | Payer: Self-pay | Admitting: Neurology

## 2010-01-27 ENCOUNTER — Ambulatory Visit: Payer: Self-pay | Admitting: Internal Medicine

## 2010-01-29 ENCOUNTER — Encounter: Payer: Self-pay | Admitting: Internal Medicine

## 2010-02-06 ENCOUNTER — Encounter: Payer: Self-pay | Admitting: Internal Medicine

## 2010-02-06 ENCOUNTER — Ambulatory Visit: Payer: Self-pay

## 2010-02-06 ENCOUNTER — Ambulatory Visit: Payer: Self-pay | Admitting: Cardiology

## 2010-02-06 ENCOUNTER — Ambulatory Visit (HOSPITAL_COMMUNITY): Admission: RE | Admit: 2010-02-06 | Discharge: 2010-02-06 | Payer: Self-pay | Admitting: Internal Medicine

## 2010-02-09 ENCOUNTER — Ambulatory Visit: Payer: Self-pay | Admitting: Internal Medicine

## 2010-02-09 DIAGNOSIS — F411 Generalized anxiety disorder: Secondary | ICD-10-CM | POA: Insufficient documentation

## 2010-02-10 ENCOUNTER — Ambulatory Visit: Payer: Self-pay | Admitting: Cardiology

## 2010-02-10 DIAGNOSIS — I5032 Chronic diastolic (congestive) heart failure: Secondary | ICD-10-CM

## 2010-02-16 ENCOUNTER — Encounter: Payer: Self-pay | Admitting: Cardiology

## 2010-02-16 ENCOUNTER — Telehealth (INDEPENDENT_AMBULATORY_CARE_PROVIDER_SITE_OTHER): Payer: Self-pay | Admitting: Radiology

## 2010-02-17 ENCOUNTER — Ambulatory Visit: Payer: Self-pay

## 2010-02-17 ENCOUNTER — Ambulatory Visit: Payer: Self-pay | Admitting: Cardiology

## 2010-02-17 ENCOUNTER — Encounter (HOSPITAL_COMMUNITY): Admission: RE | Admit: 2010-02-17 | Discharge: 2010-04-22 | Payer: Self-pay | Admitting: Cardiology

## 2010-02-26 ENCOUNTER — Ambulatory Visit: Payer: Self-pay | Admitting: Cardiology

## 2010-02-26 ENCOUNTER — Telehealth: Payer: Self-pay | Admitting: *Deleted

## 2010-03-03 LAB — CONVERTED CEMR LAB
Calcium: 8.9 mg/dL (ref 8.4–10.5)
GFR calc non Af Amer: 66.67 mL/min (ref >60)
Glucose, Bld: 181 mg/dL — ABNORMAL HIGH (ref 70–99)
Potassium: 4.1 meq/L (ref 3.5–5.1)
Sodium: 141 meq/L (ref 135–145)

## 2010-03-16 ENCOUNTER — Telehealth: Payer: Self-pay | Admitting: *Deleted

## 2010-03-23 ENCOUNTER — Telehealth: Payer: Self-pay | Admitting: *Deleted

## 2010-04-27 ENCOUNTER — Telehealth: Payer: Self-pay | Admitting: *Deleted

## 2010-05-13 ENCOUNTER — Telehealth: Payer: Self-pay | Admitting: *Deleted

## 2010-05-15 ENCOUNTER — Telehealth: Payer: Self-pay | Admitting: *Deleted

## 2010-06-01 ENCOUNTER — Ambulatory Visit: Payer: Self-pay | Admitting: Internal Medicine

## 2010-06-11 ENCOUNTER — Encounter: Payer: Self-pay | Admitting: Internal Medicine

## 2010-06-25 ENCOUNTER — Emergency Department (HOSPITAL_BASED_OUTPATIENT_CLINIC_OR_DEPARTMENT_OTHER): Admission: EM | Admit: 2010-06-25 | Discharge: 2010-06-25 | Payer: Self-pay | Admitting: Emergency Medicine

## 2010-07-16 ENCOUNTER — Ambulatory Visit: Payer: Self-pay | Admitting: Internal Medicine

## 2010-07-16 ENCOUNTER — Telehealth: Payer: Self-pay | Admitting: *Deleted

## 2010-07-16 LAB — CONVERTED CEMR LAB: Hgb A1c MFr Bld: 8.3 % — ABNORMAL HIGH (ref 4.6–6.5)

## 2010-07-21 ENCOUNTER — Ambulatory Visit: Payer: Self-pay | Admitting: Internal Medicine

## 2010-07-22 ENCOUNTER — Telehealth: Payer: Self-pay | Admitting: *Deleted

## 2010-07-23 ENCOUNTER — Telehealth: Payer: Self-pay | Admitting: *Deleted

## 2010-07-28 ENCOUNTER — Encounter: Payer: Self-pay | Admitting: Internal Medicine

## 2010-07-28 ENCOUNTER — Telehealth: Payer: Self-pay | Admitting: *Deleted

## 2010-08-06 ENCOUNTER — Encounter: Admission: RE | Admit: 2010-08-06 | Discharge: 2010-08-06 | Payer: Self-pay | Admitting: Neurology

## 2010-08-13 ENCOUNTER — Telehealth: Payer: Self-pay | Admitting: *Deleted

## 2010-09-21 ENCOUNTER — Ambulatory Visit: Payer: Self-pay | Admitting: Cardiology

## 2010-09-21 ENCOUNTER — Encounter: Payer: Self-pay | Admitting: Cardiology

## 2010-09-22 ENCOUNTER — Encounter: Payer: Self-pay | Admitting: Internal Medicine

## 2010-09-29 ENCOUNTER — Telehealth: Payer: Self-pay | Admitting: Internal Medicine

## 2010-10-12 ENCOUNTER — Telehealth: Payer: Self-pay | Admitting: *Deleted

## 2010-10-12 ENCOUNTER — Encounter: Payer: Self-pay | Admitting: Internal Medicine

## 2010-10-12 ENCOUNTER — Encounter: Payer: Self-pay | Admitting: *Deleted

## 2010-10-12 LAB — CONVERTED CEMR LAB
Creatinine, Ser: 0.67 mg/dL
Glucose: 227 mg/dL

## 2010-10-13 ENCOUNTER — Ambulatory Visit: Payer: Self-pay | Admitting: Internal Medicine

## 2010-10-13 LAB — CONVERTED CEMR LAB
Basophils Absolute: 0.1 10*3/uL (ref 0.0–0.1)
Basophils Relative: 0.5 % (ref 0.0–3.0)
Eosinophils Absolute: 0.2 10*3/uL (ref 0.0–0.7)
Free T4: 0.8 ng/dL (ref 0.60–1.60)
Hemoglobin: 13.1 g/dL (ref 12.0–15.0)
Lymphocytes Relative: 27 % (ref 12.0–46.0)
MCHC: 33.8 g/dL (ref 30.0–36.0)
MCV: 83.2 fL (ref 78.0–100.0)
Monocytes Absolute: 0.7 10*3/uL (ref 0.1–1.0)
Neutro Abs: 8.1 10*3/uL — ABNORMAL HIGH (ref 1.4–7.7)
Neutrophils Relative %: 65.4 % (ref 43.0–77.0)
RBC: 4.67 M/uL (ref 3.87–5.11)
RDW: 14.5 % (ref 11.5–14.6)
TSH: 1.32 microintl units/mL (ref 0.35–5.50)

## 2010-10-20 ENCOUNTER — Ambulatory Visit: Payer: Self-pay | Admitting: Internal Medicine

## 2010-10-20 DIAGNOSIS — D72829 Elevated white blood cell count, unspecified: Secondary | ICD-10-CM | POA: Insufficient documentation

## 2010-10-26 ENCOUNTER — Telehealth: Payer: Self-pay | Admitting: Internal Medicine

## 2010-10-27 ENCOUNTER — Telehealth: Payer: Self-pay | Admitting: *Deleted

## 2010-10-30 ENCOUNTER — Telehealth: Payer: Self-pay | Admitting: Internal Medicine

## 2010-11-22 HISTORY — PX: CATARACT EXTRACTION W/ INTRAOCULAR LENS  IMPLANT, BILATERAL: SHX1307

## 2010-11-24 ENCOUNTER — Encounter: Payer: Self-pay | Admitting: *Deleted

## 2010-12-08 ENCOUNTER — Telehealth: Payer: Self-pay | Admitting: *Deleted

## 2010-12-13 ENCOUNTER — Encounter: Payer: Self-pay | Admitting: Hematology & Oncology

## 2010-12-15 ENCOUNTER — Encounter: Payer: Self-pay | Admitting: Internal Medicine

## 2010-12-15 ENCOUNTER — Ambulatory Visit
Admission: RE | Admit: 2010-12-15 | Discharge: 2010-12-15 | Payer: Self-pay | Source: Home / Self Care | Attending: Internal Medicine | Admitting: Internal Medicine

## 2010-12-15 DIAGNOSIS — M7989 Other specified soft tissue disorders: Secondary | ICD-10-CM | POA: Insufficient documentation

## 2010-12-15 DIAGNOSIS — R635 Abnormal weight gain: Secondary | ICD-10-CM | POA: Insufficient documentation

## 2010-12-15 DIAGNOSIS — E669 Obesity, unspecified: Secondary | ICD-10-CM | POA: Insufficient documentation

## 2010-12-15 LAB — CONVERTED CEMR LAB: Blood Glucose, Fingerstick: 191

## 2010-12-16 ENCOUNTER — Encounter: Payer: Self-pay | Admitting: Internal Medicine

## 2010-12-16 ENCOUNTER — Ambulatory Visit: Admission: RE | Admit: 2010-12-16 | Discharge: 2010-12-16 | Payer: Self-pay | Source: Home / Self Care

## 2010-12-19 ENCOUNTER — Encounter: Payer: Self-pay | Admitting: Internal Medicine

## 2010-12-20 LAB — CONVERTED CEMR LAB
ALT: 17 units/L (ref 0–35)
AST: 25 units/L (ref 0–37)
Alkaline Phosphatase: 85 units/L (ref 39–117)
BUN: 17 mg/dL (ref 6–23)
Basophils Relative: 0.8 % (ref 0.0–3.0)
Blood Glucose, Fingerstick: 198
Cholesterol: 180 mg/dL (ref 0–200)
Creatinine, Ser: 0.8 mg/dL (ref 0.4–1.2)
Creatinine,U: 116.7 mg/dL
Eosinophils Absolute: 0.2 10*3/uL (ref 0.0–0.7)
Free T4: 0.7 ng/dL (ref 0.6–1.6)
GFR calc non Af Amer: 76.41 mL/min (ref >60)
Hemoglobin: 13.4 g/dL (ref 12.0–15.0)
Lymphs Abs: 4.3 10*3/uL — ABNORMAL HIGH (ref 0.7–4.0)
MCHC: 33.3 g/dL (ref 30.0–36.0)
MCV: 86.1 fL (ref 78.0–100.0)
Microalb, Ur: 0.6 mg/dL (ref 0.0–1.9)
Monocytes Absolute: 0.6 10*3/uL (ref 0.1–1.0)
Neutro Abs: 6.8 10*3/uL (ref 1.4–7.7)
Potassium: 3.9 meq/L (ref 3.5–5.1)
RBC: 4.65 M/uL (ref 3.87–5.11)
Triglycerides: 110 mg/dL (ref 0.0–149.0)
VLDL: 22 mg/dL (ref 0.0–40.0)

## 2010-12-22 NOTE — Assessment & Plan Note (Signed)
Summary: fu per pt/njr//pt rescd//ccm   Vital Signs:  Patient profile:   65 year old female Menstrual status:  hysterectomy Weight:      198 pounds O2 Sat:      98 % on Room air Pulse rate:   115 / minute BP sitting:   150 / 70  (right arm) Cuff size:   regular  Vitals Entered By: Romualdo Bolk, CMA (AAMA) (February 09, 2010 1:39 PM)  O2 Flow:  Room air CC: Follow-up visit on echo and pft's   History of Present Illness: Dalani Mette comesin today for  follow up of her dyspnea evaluation., Has had pfts and echocardiogram and in the interim had exacerbation  of coughing and  wheeze treated with      z pack and depomedrol.   For one day she had total relief of her cough and wheeze  but then has slowly come back . Her BG elevation was moderate only 30 points .    She still has cough and wheeze today without fever .  Still has dyspnea.   No other change in health status. Has appt with cards tomorrow. BP good BG "ok " no lows no change in status. Tobacco : down to 2 per day. Fatigue persists.  Seizure dis   and neuroma: has seen Neuro and stable    . No restrictions.   Preventive Screening-Counseling & Management  Alcohol-Tobacco     Alcohol drinks/day: 0     Smoking Status: current     Smoking Cessation Counseling: yes     Smoke Cessation Stage: precontemplative     Packs/Day: <0.25     Year Started: 1980  Caffeine-Diet-Exercise     Caffeine use/day: 1     Does Patient Exercise: yes     Type of exercise: walk     Times/week: 7  Comments: 2 cigs a day.  Current Medications (verified): 1)  Venlafaxine Hcl 75 Mg Xr24h-Tab (Venlafaxine Hcl) .... 2 By Mouth Once Daily 2)  Seroquel 100 Mg Tabs (Quetiapine Fumarate) .... 2 By Mouth Once Daily 3)  Premarin 0.625 Mg  Tabs (Estrogens Conjugated) .Marland Kitchen.. 1 By Mouth Once Daily 4)  Lipitor 10 Mg  Tabs (Atorvastatin Calcium) .Marland Kitchen.. 1 By Mouth Once Daily 5)  Flonase 50 Mcg/act  Susp (Fluticasone Propionate) .... Use As Needed 6)   Accu-Chek Aviva  Strp (Glucose Blood) .... Use As Directed 7)  Humalog Kwikpen 100 Unit/ml Soln (Insulin Lispro (Human)) .... Use As A Sliding Scale As Directed 8)  Promethazine Hcl 25 Mg  Supp (Promethazine Hcl) .Marland Kitchen.. 1 Every 8 Hrs As Needed 9)  Pen Needles 31g X 8 Mm Misc (Insulin Pen Needle) .... Use As Directed 10)  Lantus 100 Unit/ml Soln (Insulin Glargine) .... Take 14 Units Daily 11)  Multivitamins   Tabs (Multiple Vitamin) 12)  Keppra 250 Mg Tabs (Levetiracetam) .... Two Tabs Two Times A Day 13)  Vitamin B-12 1000 Mcg Tabs (Cyanocobalamin) 14)  Melatonin 3 Mg Tabs (Melatonin) 15)  Zofran 8 Mg Tabs (Ondansetron Hcl) .... As Needed 16)  Lomotil 2.5-0.025 Mg Tabs (Diphenoxylate-Atropine) 17)  Ondansetron Hcl 8 Mg Tabs (Ondansetron Hcl) .Marland Kitchen.. 1 By Mouth Two Times A Day For Nausea 18)  Ventolin Hfa 108 (90 Base) Mcg/act Aers (Albuterol Sulfate) .Marland Kitchen.. 1-2 Puffs  Q 6 Hours As Needed Wheezing  Allergies (verified): 1)  ! Penicillin 2)  * All Narcotics 3)  * Alchol Base Drugs 4)  Hyoscyamine  Past History:  Care Management: Neurology:  Dr. Sandria Manly Gastroenterology: Dr. Leone Payor Psychiatry: Dr. Maurice Small: Norwood Levo  ----  Social History: Married 3rd time    Current Smoker 1 ppd  per day since in 33s down now attends AA Husband with ms on disability   getting worse.  recently hosp for elevated BP ( sees Dr Debby Bud) from [facility] originally.   on disability  Daily Caffeine Use-1/3 cup daily Illicit Drug Use - no   Patient does not get regular exercise.  Packs/Day:  <0.25  Review of Systems       The patient complains of dyspnea on exertion and prolonged cough.  The patient denies anorexia, fever, weight loss, weight gain, vision loss, decreased hearing, hoarseness, syncope, peripheral edema, melena, hematochezia, severe indigestion/heartburn, transient blindness, difficulty walking, abnormal bleeding, enlarged lymph nodes, and angioedema.    Physical Exam  General:  alert,  well-developed, well-nourished, and cooperative to examination.   Head:  normocephalic and atraumatic.   Eyes:  vision grossly intact.   Neck:  No deformities, masses, or tenderness noted. Lungs:  normal respiratory effort and no accessory muscle use.  expiratory wheezes at the bases no rales or rhonchi Heart:  normal rate, regular rhythm, no murmur, no gallop, and no rub.   Pulses:  nl cap  refill  Extremities:  no clubbing cyanosis or edema  Neurologic:  alert & oriented X3 and gait normal.   Skin:  turgor normal, color normal, no ecchymoses, and no petechiae.   Cervical Nodes:  No lymphadenopathy noted Psych:  Oriented X3, memory intact for recent and remote, normally interactive, good eye contact, not anxious appearing, and not depressed appearing.     Impression & Recommendations:  Problem # 1:  COPD (ICD-496)  reviewed  PFTs which show mild Obstruction with some  reversibility . Had less hyperglycemia woith depomedrol  so will  repeat and begin on symbicort sample and rx and then follow up . Her updated medication list for this problem includes:    Ventolin Hfa 108 (90 Base) Mcg/act Aers (Albuterol sulfate) .Marland Kitchen... 1-2 puffs  q 6 hours as needed wheezing    Symbicort 160-4.5 Mcg/act Aero (Budesonide-formoterol fumarate) .Marland Kitchen... 2 sprays two times a day   or as directed  Orders: Prescription Created Electronically [phone])  Problem # 2:  DYSPNEA/SHORTNESS OF BREATH (ICD-786.09) echo nl EF   diastolic dysfunction  High risk factors   keep cards appointment   ? need for stress test. Her updated medication list for this problem includes:    Ventolin Hfa 108 (90 Base) Mcg/act Aers (Albuterol sulfate) .Marland Kitchen... 1-2 puffs  q 6 hours as needed wheezing    Symbicort 160-4.5 Mcg/act Aero (Budesonide-formoterol fumarate) .Marland Kitchen... 2 sprays two times a day   or as directed  Orders: Depo- Medrol 80mg  (J1040) Admin of Therapeutic Inj  intramuscular or subcutaneous (60454)  Problem # 3:  HYPERTENSION  (ICD-401.9) elevated today had been better  may need to add medication BP today: 150/70 Prior BP: 138/78 (01/22/2010)  Prior 10 Yr Risk Heart Disease: 24 % (02/27/2009)  Labs Reviewed: K+: 3.9 (01/19/2010) Creat: : 0.8 (01/19/2010)   Chol: 180 (01/19/2010)   HDL: 64.00 (01/19/2010)   LDL: 94 (01/19/2010)   TG: 110.0 (01/19/2010)  Problem # 4:  DIABETES MELLITUS, TYPE II (ICD-250.00) had been seeing dr Everardo All   currently elevated a1c that will need to be better controlled.   Will readdres managing  her DM.  if she is not going to ,.fu with  specialist. .   at  follow up .    did not disc this today  Her updated medication list for this problem includes:    Humalog Kwikpen 100 Unit/ml Soln (Insulin lispro (human)) ..... Use as a sliding scale as directed    Lantus 100 Unit/ml Soln (Insulin glargine) .Marland Kitchen... 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 Her updated medication list for this problem includes:    Venlafaxine Hcl 75 Mg Xr24h-tab (Venlafaxine hcl) .Marland Kitchen... 2 by mouth once daily  Problem # 9:  FATIGUE (ICD-780.79) hard to tell which factors  are the real culprit although suspect pulmonary at present.    Complete Medication List: 1)  Venlafaxine Hcl 75 Mg Xr24h-tab (Venlafaxine hcl) .... 2 by mouth once daily 2)  Seroquel 100 Mg Tabs (Quetiapine fumarate) .... 2 by mouth once daily 3)  Premarin 0.625 Mg Tabs (Estrogens conjugated) .Marland Kitchen.. 1 by mouth once daily 4)  Lipitor 10 Mg Tabs (Atorvastatin calcium) .Marland Kitchen.. 1 by mouth once daily 5)  Flonase 50 Mcg/act Susp (Fluticasone propionate) .... Use as needed 6)   Accu-chek Aviva Strp (Glucose blood) .... Use as directed 7)  Humalog Kwikpen 100 Unit/ml Soln (Insulin lispro (human)) .... Use as a sliding scale as directed 8)  Promethazine Hcl 25 Mg Supp (Promethazine hcl) .Marland Kitchen.. 1 every 8 hrs as needed 9)  Pen Needles 31g X 8 Mm Misc (Insulin pen needle) .... Use as directed 10)  Lantus 100 Unit/ml Soln (Insulin glargine) .... Take 14 units daily 11)  Multivitamins Tabs (Multiple vitamin) 12)  Keppra 250 Mg Tabs (Levetiracetam) .... Two tabs two times a day 13)  Vitamin B-12 1000 Mcg Tabs (Cyanocobalamin) 14)  Melatonin 3 Mg Tabs (Melatonin) 15)  Zofran 8 Mg Tabs (Ondansetron hcl) .... As needed 16)  Lomotil 2.5-0.025 Mg Tabs (Diphenoxylate-atropine) 17)  Ondansetron Hcl 8 Mg Tabs (Ondansetron hcl) .Marland Kitchen.. 1 by mouth two times a day for nausea 18)  Ventolin Hfa 108 (90 Base) Mcg/act Aers (Albuterol sulfate) .Marland Kitchen.. 1-2 puffs  q 6 hours as needed wheezing 19)  Symbicort 160-4.5 Mcg/act Aero (Budesonide-formoterol fumarate) .... 2 sprays two times a day   or as directed  Patient Instructions: 1)  begin   Symbicort   2 puffs two times a day . 2)  Continue to  try stop smoking  . 3)  Rec  follow up about your lungs in 2 months   or as needed .   4)  Keep cardiology appt and   follow up as we discussed   Prescriptions: SYMBICORT 160-4.5 MCG/ACT AERO (BUDESONIDE-FORMOTEROL FUMARATE) 2 sprays two times a day   or as directed  #1 x 6   Entered and Authorized by:   Madelin Headings MD   Signed by:   Madelin Headings MD on 02/09/2010   Method used:   Electronically to        [facility]* (retail)       [address] [place], [place]  [address]  Ph: [phone] or [phone]       Fax: [phone]   RxID:   [id number]    Medication Administration  Injection # 1:    Medication: Depo- Medrol 80mg     Diagnosis: DYSPNEA/SHORTNESS OF BREATH (ICD-786.09)    Route: IM    Site: LUOQ gluteus     Exp Date: 09/22/2010    Lot #: [id number]    Mfr: Pharmacia    Patient tolerated injection without complications    Given by: Romualdo Bolk, CMA (AAMA) (February 09, 2010 2:12 PM)  Orders Added: 1)  Depo- Medrol 80mg  [J1040] 2)  Admin of Therapeutic Inj  intramuscular or subcutaneous [96372] 3)  Est. Patient Level IV [25366] 4)  Prescription Created Electronically [phone]

## 2010-12-22 NOTE — Progress Notes (Signed)
Summary: refill on lantus  Phone Note From Pharmacy   Caller: [facility]* Reason for Call: Needs renewal Details for Reason: lantus Initial call taken by: Romualdo Bolk, CMA (AAMA),  May 13, 2010 1:34 PM  Follow-up for Phone Call        ok to refill x 2 months . Needs Hg a1c done in the next month . and ROV  ( Her last a1c was in feb march and elevated ).      unless another specialist is  following her diabetes  Follow-up by: Madelin Headings MD,  May 13, 2010 5:31 PM  Additional Follow-up for Phone Call Additional follow up Details #1::        Spoke with pt's husband and pt is not seeing anyone else about her diabetes. I told him that she needs to call back to schedule these appts for next month. Additional Follow-up by: Romualdo Bolk, CMA Duncan Dull),  May 14, 2010 9:26 AM    Prescriptions: LANTUS 100 UNIT/ML SOLN (INSULIN GLARGINE) Take 14 units daily  #10 x 1   Entered by:   Romualdo Bolk, CMA (AAMA)   Authorized by:   Madelin Headings MD   Signed by:   Romualdo Bolk, CMA (AAMA) on 05/14/2010   Method used:   Electronically to        [facility]* (retail)       [address] [place], [place]  [address]       Ph: [phone] or [phone]       Fax: [phone]   RxID:   [id number]

## 2010-12-22 NOTE — Progress Notes (Signed)
Summary: refills   Phone Note From Pharmacy   Caller: [facility]* Reason for Call: Needs renewal Details for Reason: BD SyR UFII . 31 G Initial call taken by: Romualdo Bolk, CMA (AAMA),  July 28, 2010 4:46 PM  Follow-up for Phone Call        rx sent to pharmacy. Follow-up by: Romualdo Bolk, CMA (AAMA),  July 28, 2010 4:47 PM    New/Updated Medications: BD INSULIN SYRINGE ULTRAFINE 31G X 5/16" 0.3 ML MISC (INSULIN SYRINGE-NEEDLE U-100) use as directed Prescriptions: BD INSULIN SYRINGE ULTRAFINE 31G X 5/16" 0.3 ML MISC (INSULIN SYRINGE-NEEDLE U-100) use as directed  #300 x 3   Entered by:   Romualdo Bolk, CMA (AAMA)   Authorized by:   Madelin Headings MD   Signed by:   Romualdo Bolk, CMA (AAMA) on 07/28/2010   Method used:   Electronically to        [facility]* (retail)       [address] [place], [place]  [address]       Ph: [phone] or [phone]       Fax: [phone]   RxID:   [id number]

## 2010-12-22 NOTE — Letter (Signed)
Summary: [facility]  [facility]   Imported By: Maryln Gottron 08/03/2010 15:59:25  _____________________________________________________________________  External Attachment:    Type:   Image     Comment:   External Document

## 2010-12-22 NOTE — Assessment & Plan Note (Signed)
Summary: np6/Dyspnea/SOB/   Primary Provider:  Berniece Andreas MD   History of Present Illness: 65 yo with history of hyperlipidemia, HTN, and diabetes presents for evaluation of shortness of breath.  Patient has had increased shortness of breath beginning about 3 months ago.  She has COPD with mild obstruction on PFTs.  She coughs at night and wheezes.  Her chest feels heavy when she is wheezing.  She has taken prednisone and reports that this helps her breathing and relieves the chest heaviness.  She started Symbicort yesterday.  No orthopnea but short of breath walking her dogs in the yard.  She additionally has had a 20 lb weight gain in the last 4 months (has cut back on smoking and is eating more).  She had an echo done this month showing preserved systolic function but moderate diastolic dysfunction.   ECG: NSR, normal  Labs (2/11): LDL 94, HDL 64, creatinine 0.8, BNP 21  Current Medications (verified): 1)  Venlafaxine Hcl 75 Mg Xr24h-Tab (Venlafaxine Hcl) .... 2 By Mouth Once Daily 2)  Seroquel 100 Mg Tabs (Quetiapine Fumarate) .... 2 By Mouth Once Daily 3)  Premarin 0.625 Mg  Tabs (Estrogens Conjugated) .Marland Kitchen.. 1 By Mouth Once Daily 4)  Lipitor 10 Mg  Tabs (Atorvastatin Calcium) .Marland Kitchen.. 1 By Mouth Once Daily 5)  Flonase 50 Mcg/act  Susp (Fluticasone Propionate) .... Use As Needed 6)  Accu-Chek Aviva  Strp (Glucose Blood) .... Use As Directed 7)  Humalog Kwikpen 100 Unit/ml Soln (Insulin Lispro (Human)) .... Use As A Sliding Scale As Directed 8)  Pen Needles 31g X 8 Mm Misc (Insulin Pen Needle) .... Use As Directed 9)  Lantus 100 Unit/ml Soln (Insulin Glargine) .... Take 14 Units Daily 10)  Multivitamins   Tabs (Multiple Vitamin) .... Once Daily 11)  Keppra 250 Mg Tabs (Levetiracetam) .... Two Tabs Two Times A Day 12)  Vitamin B-12 1000 Mcg Tabs (Cyanocobalamin) .... Once Daily 13)  Zofran 8 Mg Tabs (Ondansetron Hcl) .... As Needed 14)  Symbicort 160-4.5 Mcg/act Aero (Budesonide-Formoterol  Fumarate) .... 2 Sprays Two Times A Day   or As Directed  Allergies: 1)  ! Penicillin 2)  * All Narcotics 3)  * Alchol Base Drugs 4)  Hyoscyamine  Past History:  Past Medical History: 1. HYPERTENSION (ICD-401.9) 2. HYPERLIPIDEMIA (ICD-272.4) 3. 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 Hx of NARCOTIC ABUSE (ICD-305.90): [facility] summer 2010.  6. ? of INFLAMMATORY BOWEL DISEASE (ICD-558.9) 7. DIVERTICULOSIS-COLON (ICD-562.10) 8. IBS (ICD-564.1) 9. COPD (ICD-496): PFTs (3/11) FVC 80%, FEV1 74%, ratio 66%, TLC 90%, DLCO 54%. Mild obstruction, some response to bronchodilator.  10. TOBACCO USE (ICD-305.1) 11. ABUSE, ALCOHOL, IN REMISSION (ICD-305.03) 12. SEIZURE DISORDER (ICD-780.39) 13. GERD (ICD-530.81) 14. DIABETES MELLITUS, TYPE II (ICD-250.00)   15. DEPRESSION (ICD-311)per dr. Nolen Mu 16. ALLERGIC RHINITIS (ICD-477.9) 17. Hyponatremia with tegretol   18. Diastolic CHF: Echo (3/11) with EF 55-60%, moderate (grade II) diastolic dysfunction, mild LAE  Consults Dr. Leone Payor Dr. Nolen Mu  Family History: Reviewed history from 01/21/2009 and no changes required. Family History of  father  2 suicides in family  Family History of Colon Cancer:Mothers sister  Family History of Diabetes: Both sides of family, parents, grandparents Family History of Heart Disease: Both sides of family, parents, grandparents  Social History: Married 3rd time    Current Smoker 1 ppd  per day since in 20s down now to 3 cigs/day attends AA Husband with MS on disability getting worse.  from [place]  originally. Former Engineer, civil (consulting) Daily Caffeine Use-1/3 cup daily Illicit Drug Use - no   Patient does not get regular exercise.   Review of Systems       All systems reviewed and negative except as per HPI.   Vital Signs:  Patient profile:   65 year old female Menstrual status:  hysterectomy Height:      60.5 inches Weight:      194 pounds BMI:     37.40 Pulse rate:   96  / minute BP sitting:   142 / 78  (left arm)  Vitals Entered By: Laurance Flatten CMA (February 10, 2010 3:34 PM)  Physical Exam  General:  Well developed, well nourished, in no acute distress.  Obese.  Head:  normocephalic and atraumatic Nose:  no deformity, discharge, inflammation, or lesions Mouth:  Teeth, gums and palate normal. Oral mucosa normal. Neck:  Neck supple, no JVD. No masses, thyromegaly or abnormal cervical nodes. Lungs:  Slightly decreased breath sounds bilaterally.  Heart:  Non-displaced PMI, chest non-tender; regular rate and rhythm, S1, S2 without murmurs, rubs or gallops. Carotid upstroke normal, no bruit. Pedals normal pulses. No edema, no varicosities. Abdomen:  Bowel sounds positive; abdomen soft and non-tender without masses, organomegaly, or hernias noted. No hepatosplenomegaly. Msk:  Back normal, normal gait. Muscle strength and tone normal. Extremities:  No clubbing or cyanosis. Neurologic:  Alert and oriented x 3. Skin:  Intact without lesions or rashes. Psych:  Normal affect.   Impression & Recommendations:  Problem # 1:  DYSPNEA/SHORTNESS OF BREATH (ICD-786.09) Patient has exertional dyspnea that is likely multifactorial.  It is associated with wheezing and better with prednisone, so suspect COPD plays a role.  She is still smoking 3 cigarettes a day. She has started Symbicort.  She has moderate diastolic dysfunction on echo, and though she does not appear significantly volume overloaded, diastolic CHF may play a role.  BNP is normal but this often can be a false negative finding in obese patients.  Given her risk factors, ischemic diastolic dysfunction could be a concern. Finally, she has gained 20 lbs in the last 4 months after cutting back on smoking.  This could certainly worsen her breathing.   - Continue Symbicort, could add Spiriva in the future.  - Would give her a trial of Lasix given significant diastolic dysfunction: Lasix 20 mg daily + KCl 10 mEq daily.     - ETT-myoview to rule out ischemia presenting as ischemic diastolic dysfunction and she should start ASA 81 mg daily.   Problem # 2:  HYPERTENSION (ICD-401.9) BP is high today and was high at last appointment with Dr. Fabian Sharp.  Patient says that it has run in the 120s-130s systolic in home.  She will check her pressure on her home cuff and bring the readings to her next appointment.    Problem # 3:  HYPERLIPIDEMIA (ICD-272.4) Given diabetes, would aim for LDL ideally < 70, at least < 100.    Other Orders: Nuclear Stress Test (Nuc Stress Test)  Patient Instructions: 1)  Your physician has recommended you make the following change in your medication:  2)    3)  Start lasix(furosemide) 20mg  daily 4)  Start KCl(potassium) daily 5)  Start Aspirin 81mg  daily  this should be buffered or coated 6)  Take and record your blood pressure--bring the readings to your appointment with Dr Shirlee Latch  in about  2 weeks 7)  Your physician recommends that you schedule a follow-up appointment with Dr  Danijah Noh   after testing is completed in about 2 weeks-- 8)  BMP when you see Dr Shirlee Latch in about 2 weeks [phone] 9)  Your physician has requested that you have an exercise stress myoview.  For further information please visit [email or web address]  Please follow instruction sheet, as given. Prescriptions: POTASSIUM CHLORIDE CR 10 MEQ CR-CAPS (POTASSIUM CHLORIDE) Take one tablet by mouth daily  #30 x 6   Entered by:   Katina Dung, RN, BSN   Authorized by:   Marca Ancona, MD   Signed by:   Katina Dung, RN, BSN on 02/10/2010   Method used:   Electronically to        [facility]* (retail)       [address] [place], [place]  [address]       Ph: [phone] or [phone]       Fax: [phone]   RxID:   [id number] FUROSEMIDE 20 MG TABS (FUROSEMIDE) one tablet daily  #30 x 6   Entered by:   Katina Dung, RN, BSN   Authorized by:    Marca Ancona, MD   Signed by:   Katina Dung, RN, BSN on 02/10/2010   Method used:   Electronically to        [facility]* (retail)       [address] [place], [place]  [address]       Ph: [phone] or [phone]       Fax: [phone]   RxID:   [id number]

## 2010-12-22 NOTE — Progress Notes (Signed)
Summary: Nuc Pre-procedure  Phone Note Outgoing Call Call back at [facility] Phone [phone]   Call placed by: [employer], CMA Call placed to: Patient Reason for Call: Confirm/change Appt Summary of Call: Left message with information on Myoview Information Sheet (see scanned document for details).      Nuclear Med Background Indications for Stress Test: Evaluation for Ischemia   History: COPD, Echo, Emphysema  History Comments: 3/11- Echo- EF= 55-60%. Moderate diastolic dysfunction/CHF. GERD  Symptoms: Chest Pressure, DOE, Fatigue    Nuclear Pre-Procedure Cardiac Risk Factors: Family History - CAD, Hypertension, IDDM Type 2, Lipids, Smoker Height (in): 60.5

## 2010-12-22 NOTE — Assessment & Plan Note (Signed)
Summary: SINUSITIS // RS   Vital Signs:  Patient profile:   65 year old female Menstrual status:  hysterectomy Weight:      208 pounds O2 Sat:      98 % on Room air Temp:     99.0 degrees F oral Pulse rate:   98 / minute BP sitting:   130 / 70  (right arm) Cuff size:   large  Vitals Entered By: Romualdo Bolk, CMA (AAMA) (June 01, 2010 11:20 AM)  O2 Flow:  Room air CC: Pt is hearing a high pitched noise in both ears x 2 weeks, dizziness and off balance. Pt is also having sinus pressure more on rt side than left. Pt took 2 days worth of bactrum over the weekend.    History of Present Illness: Joyce Richardson comes in today  for acute visit for ?sinusitis  Onset of bilaterall high pitched tinnitus     almost 2 weeks ago without fever.  and has more congestion and head pressure than ususal ..  COugh about the same . smokng less but continues .  NO hx of same signs .  Dm doing ok bg n the 120 range  no lows  No recent seizures and no change in meds .  On lasix once daily  stable dose   Preventive Screening-Counseling & Management  Alcohol-Tobacco     Alcohol drinks/day: 0     Smoking Status: current     Smoking Cessation Counseling: yes     Smoke Cessation Stage: precontemplative     Packs/Day: <0.25     Year Started: 1980     Tobacco Counseling: to quit use of tobacco products  Caffeine-Diet-Exercise     Caffeine use/day: 1     Does Patient Exercise: yes     Type of exercise: walk     Exercise (avg: min/session): 2:54     Times/week: 7  Current Medications (verified): 1)  Venlafaxine Hcl 75 Mg Xr24h-Tab (Venlafaxine Hcl) .... 2 By Mouth Once Daily 2)  Seroquel 100 Mg Tabs (Quetiapine Fumarate) .... 2 By Mouth Once Daily 3)  Premarin 0.625 Mg  Tabs (Estrogens Conjugated) .Marland Kitchen.. 1 By Mouth Once Daily 4)  Lipitor 10 Mg  Tabs (Atorvastatin Calcium) .Marland Kitchen.. 1 By Mouth Once Daily 5)  Flonase 50 Mcg/act  Susp (Fluticasone Propionate) .... Use As Needed 6)  Accu-Chek Aviva   Strp (Glucose Blood) .... Use As Directed 7)  Humalog Kwikpen 100 Unit/ml Soln (Insulin Lispro (Human)) .... Use As A Sliding Scale As Directed 8)  Pen Needles 31g X 8 Mm Misc (Insulin Pen Needle) .... Use As Directed 9)  Lantus 100 Unit/ml Soln (Insulin Glargine) .... Take 14 Units Daily 10)  Multivitamins   Tabs (Multiple Vitamin) .... Once Daily 11)  Keppra 250 Mg Tabs (Levetiracetam) .... Two Tabs Two Times A Day 12)  Vitamin B-12 1000 Mcg Tabs (Cyanocobalamin) .... Once Daily 13)  Zofran 8 Mg Tabs (Ondansetron Hcl) .... As Needed 14)  Symbicort 160-4.5 Mcg/act Aero (Budesonide-Formoterol Fumarate) .... 2 Sprays Two Times A Day   or As Directed 15)  Furosemide 20 Mg Tabs (Furosemide) .... One Tablet Daily 16)  Potassium Chloride Cr 10 Meq Cr-Caps (Potassium Chloride) .... Take One Tablet By Mouth Daily 17)  Aspirin 81 Mg Tbec (Aspirin) .... Take One Tablet By Mouth Daily 18)  Losartan Potassium 25 Mg Tabs (Losartan Potassium) .... One Tablet Daily  Allergies (verified): 1)  ! Penicillin 2)  * All Narcotics 3)  *  Alchol Base Drugs 4)  Hyoscyamine  Past History:  Past medical, surgical, family and social histories (including risk factors) reviewed, and no changes noted (except as noted below).  Past Medical History: Reviewed history from 02/26/2010 and no changes required. 1. HYPERTENSION (ICD-401.9) 2. HYPERLIPIDEMIA (ICD-272.4) 3. 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 Hx of NARCOTIC ABUSE (ICD-305.90): [facility] summer 2010.  6. ? of INFLAMMATORY BOWEL DISEASE (ICD-558.9) 7. DIVERTICULOSIS-COLON (ICD-562.10) 8. IBS (ICD-564.1) 9. COPD (ICD-496): PFTs (3/11) FVC 80%, FEV1 74%, ratio 66%, TLC 90%, DLCO 54%. Mild obstruction, some response to bronchodilator.  10. TOBACCO USE (ICD-305.1) 11. ABUSE, ALCOHOL, IN REMISSION (ICD-305.03) 12. SEIZURE DISORDER (ICD-780.39) 13. GERD (ICD-530.81) 14. DIABETES MELLITUS, TYPE II (ICD-250.00)   15. DEPRESSION  (ICD-311)per dr. Nolen Mu 16. ALLERGIC RHINITIS (ICD-477.9) 17. Hyponatremia with tegretol   18. Diastolic CHF: Echo (3/11) with EF 55-60%, moderate (grade II) diastolic dysfunction, mild LAE 19. Lexiscan myoview (3/11): likely normal with anterior attenuation.  EF 68%.  Low risk study.   Consults Dr. Leone Payor Dr. Nolen Mu  Past Surgical History: Reviewed history from 11/19/2008 and no changes required. Cholecystectomy Hysterectomy and r oopherectomy for fibroids. Appendectomy hemorrhoidectomy  Past History:  Care Management: Neurology: Dr. Sandria Manly Gastroenterology: Dr. Leone Payor Psychiatry: Dr. Maurice Small: Norwood Levo  ----  Family History: Reviewed history from 01/21/2009 and no changes required. Family History of  father  2 suicides in family  Family History of Colon Cancer:Mothers sister  Family History of Diabetes: Both sides of family, parents, grandparents Family History of Heart Disease: Both sides of family, parents, grandparents  Social History: Reviewed history from 02/10/2010 and no changes required. Married 3rd time    Current Smoker 1 ppd  per day since in 15s down now to 3 cigs/day attends AA Husband with MS on disability getting worse.  from [place] originally. Former Engineer, civil (consulting) Daily Caffeine Use-1/3 cup daily Illicit Drug Use - no   Patient does not get regular exercise.   Review of Systems  The patient denies anorexia, fever, weight loss, vision loss, chest pain, hemoptysis, severe indigestion/heartburn, hematuria, transient blindness, difficulty walking, abnormal bleeding, enlarged lymph nodes, and angioedema.         ? hearing   no falls or weakness  does have some nausea  also  asks for med refill of zofran  Physical Exam  General:  congested well in nad  Head:  normocephalic and atraumatic.   Eyes:  eoms nl  Ears:  left eac with was flushed  and toms intact  no external deformities.   Nose:  congeted  slight tenderness right  Mouth:  pharynx pink and  moist.   Lungs:  normal respiratory effort and no intercostal retractions.  some wheezwmuscicle sounds  bs equal  Heart:  normal rate, regular rhythm, no murmur, no gallop, and no rub.   Pulses:  pulses intact without delay   Extremities:  vv no edema  Neurologic:  alert & oriented X3.  gait appears nl Skin:  turgor normal, color normal, no ecchymoses, and no petechiae.   Cervical Nodes:  No lymphadenopathy noted Psych:  Oriented X3, good eye contact, not anxious appearing, and not depressed appearing.     Impression & Recommendations:  Problem # 1:  TINNITUS  NEW ONSET (ICD-388.30) with some sinus congestion right more than left    hearing ? ok   no other neuro event .    Orders: ENT Referral (ENT)  Problem # 2:  uri  ? sinusitis  will do empiric rx  and then ent evaluation  allergic to a number of meds  Problem # 3:  wax in eac  left removed and no change in symptom   tms look ok   Problem # 4:  SEIZURE DISORDER (ICD-780.39) stable per Dr Sandria Manly   Problem # 5:  hx acoustic neuroma   apparently stable followed by   Dr Sandria Manly   Problem # 6:  Hx of NARCOTIC ABUSE (ICD-305.90) 1 year  clean   Problem # 7:  DIABETES MELLITUS, TYPE II (ICD-250.00)  Her updated medication list for this problem includes:    Humalog Kwikpen 100 Unit/ml Soln (Insulin lispro (human)) ..... Use as a sliding scale as directed    Lantus 100 Unit/ml Soln (Insulin glargine) .Marland Kitchen... Take 14 units daily    Aspirin 81 Mg Tbec (Aspirin) .Marland Kitchen... Take one tablet by mouth daily    Losartan Potassium 25 Mg Tabs (Losartan potassium) ..... One tablet daily  Orders: ENT Referral (ENT)  Problem # 8:  COPD (ICD-496)  Her updated medication list for this problem includes:    Symbicort 160-4.5 Mcg/act Aero (Budesonide-formoterol fumarate) .Marland Kitchen... 2 sprays two times a day   or as directed  Problem # 9:  TOBACCO USE (ICD-305.1) decreasing use  Complete Medication List: 1)  Venlafaxine Hcl 75 Mg Xr24h-tab (Venlafaxine  hcl) .... 2 by mouth once daily 2)  Seroquel 100 Mg Tabs (Quetiapine fumarate) .... 2 by mouth once daily 3)  Premarin 0.625 Mg Tabs (Estrogens conjugated) .Marland Kitchen.. 1 by mouth once daily 4)  Lipitor 10 Mg Tabs (Atorvastatin calcium) .Marland Kitchen.. 1 by mouth once daily 5)  Flonase 50 Mcg/act Susp (Fluticasone propionate) .... Use as needed 6)  Accu-chek Aviva Strp (Glucose blood) .... Use as directed 7)  Humalog Kwikpen 100 Unit/ml Soln (Insulin lispro (human)) .... Use as a sliding scale as directed 8)  Pen Needles 31g X 8 Mm Misc (Insulin pen needle) .... Use as directed 9)  Lantus 100 Unit/ml Soln (Insulin glargine) .... Take 14 units daily 10)  Multivitamins Tabs (Multiple vitamin) .... Once daily 11)  Keppra 250 Mg Tabs (Levetiracetam) .... Two tabs two times a day 12)  Vitamin B-12 1000 Mcg Tabs (Cyanocobalamin) .... Once daily 13)  Zofran 8 Mg Tabs (Ondansetron hcl) .... As needed 14)  Symbicort 160-4.5 Mcg/act Aero (Budesonide-formoterol fumarate) .... 2 sprays two times a day   or as directed 15)  Furosemide 20 Mg Tabs (Furosemide) .... One tablet daily 16)  Potassium Chloride Cr 10 Meq Cr-caps (Potassium chloride) .... Take one tablet by mouth daily 17)  Aspirin 81 Mg Tbec (Aspirin) .... Take one tablet by mouth daily 18)  Losartan Potassium 25 Mg Tabs (Losartan potassium) .... One tablet daily 19)  Sulfamethoxazole-tmp Ds 800-160 Mg Tabs (Sulfamethoxazole-trimethoprim) .Marland Kitchen.. 1 by mouth two times a day  for sinusitis 20)  Zofran 4 Mg Tabs (Ondansetron hcl) .Marland Kitchen.. 1-2  q6-8 hours as needed nausea  Other Orders: Prescription Created Electronically [id number])  Patient Instructions: 1)  rec ENT  evaluation     of tinnitus.   2)  will treatment  for sinusitis in the meantime.  Prescriptions: ZOFRAN 4 MG TABS (ONDANSETRON HCL) 1-2  q6-8 hours as needed nausea  #24 x 1   Entered and Authorized by:   Madelin Headings MD   Signed by:   Madelin Headings MD on 06/01/2010   Method used:   Electronically to         [facility]* (retail)       [address] [place], [place]  [address]       Ph: [phone] or [phone]       Fax: [phone]   RxID:   [id number] SULFAMETHOXAZOLE-TMP DS 800-160 MG TABS (SULFAMETHOXAZOLE-TRIMETHOPRIM) 1 by mouth two times a day  for sinusitis  #20 x 0   Entered and Authorized by:   Madelin Headings MD   Signed by:   Madelin Headings MD on 06/01/2010   Method used:   Electronically to        [facility]* (retail)       [address] [place], [place]  [address]       Ph: [phone] or [phone]       Fax: [phone]   RxID:   [id number]

## 2010-12-22 NOTE — Progress Notes (Signed)
Summary: Blood sugars  Phone Note Outgoing Call   Call placed by: Romualdo Bolk, CMA Duncan Dull),  October 27, 2010 11:42 AM Call placed to: Patient Summary of Call: Spoke to pt's husband FBS 210. Then BS 216  was at 11:42am. Pt is doing alot better today. Pt done 20u humalog with breakfast Initial call taken by: Romualdo Bolk, CMA (AAMA),  October 27, 2010 11:43 AM  Follow-up for Phone Call        rec   increase the lantus  back to  30 units today increase by 2 units  Lantus  every other day until   40 units. total and then  will decide on changes.   In the meantime  can  also take humalog with meals   start with 20 units   ( can adjust eventually depending on meals and levels)  watch for hypoglycemia  The goal is to get the   long acting insulin  to control the Fasting better and cover for meals with the humalog. I think she should still take the metformin  also.  Call with readings   thursday or friday( in 2-3 days ,before weekend)  for further  advice  Follow-up by: Madelin Headings MD,  October 27, 2010 12:37 PM    Additional Follow-up for Phone Call Additional follow up Details #2::    Pt aware of this. Follow-up by: Romualdo Bolk, CMA (AAMA),  October 27, 2010 3:16 PM

## 2010-12-22 NOTE — Progress Notes (Signed)
Summary: Pt called and is coughing up yellow congestion  Phone Note Call from Patient Call back at Home Phone [phone]   Caller: Patient Summary of Call: Pt called and said that she is coughing up yellow mucus. Pt would like Shannon to call asap.  Initial call taken by: Lucy Antigua,  January 22, 2010 10:42 AM  Follow-up for Phone Call        Spoke to pt and she is now coughing up yellow mucous. No fever but feels worse and feels worse. Pt states that her chest is now rattling. Pt is also wheezing. Pt is wanting to know if she needs a antibiotic and prednisone.  Follow-up by: Romualdo Bolk, CMA (AAMA),  January 22, 2010 11:02 AM  Additional Follow-up for Phone Call Additional follow up Details #1::        Per Dr. Fabian Sharp- have pt schedule an appt with someone this afternoon. I schedule pt an appt with Dr. Caryl Never for this afternoon at 1:15pm Pt aware of appt. Additional Follow-up by: Romualdo Bolk, CMA (AAMA),  January 22, 2010 11:03 AM

## 2010-12-22 NOTE — Progress Notes (Signed)
Summary: Med List   Med List   Imported By: Roderic Ovens 02/26/2010 14:42:52  _____________________________________________________________________  External Attachment:    Type:   Image     Comment:   External Document

## 2010-12-22 NOTE — Progress Notes (Signed)
Summary: refill on flonase  Phone Note From Pharmacy   Caller: [facility]* Reason for Call: Needs renewal Details for Reason: flonase  Summary of Call: last filled on 09/18/10  Initial call taken by: Romualdo Bolk, CMA Duncan Dull),  October 12, 2010 4:34 PM  Follow-up for Phone Call        rx sent to pharmacy. Follow-up by: Romualdo Bolk, CMA Duncan Dull),  October 12, 2010 4:36 PM    Prescriptions: FLONASE 50 MCG/ACT  SUSP (FLUTICASONE PROPIONATE) use as needed  #1 x 0   Entered by:   Romualdo Bolk, CMA (AAMA)   Authorized by:   Madelin Headings MD   Signed by:   Romualdo Bolk, CMA (AAMA) on 10/12/2010   Method used:   Electronically to        [facility]* (retail)       [address] [place], [place]  [address]       Ph: [phone] or [phone]       Fax: [phone]   RxID:   [id number]

## 2010-12-22 NOTE — Progress Notes (Signed)
Summary: refill on flonase and proair  Phone Note From Pharmacy   Caller: [facility]* Reason for Call: Needs renewal Details for Reason: Fluticasone and Proair Summary of Call: last fill on fluticasone on 7/18 #1 and last fill on proair on 6/22 #1 Initial call taken by: Romualdo Bolk, CMA (AAMA),  July 16, 2010 9:08 AM  Follow-up for Phone Call        ok x 3  Follow-up by: Madelin Headings MD,  July 16, 2010 11:37 AM  Additional Follow-up for Phone Call Additional follow up Details #1::        Rx sent electronically Additional Follow-up by: Romualdo Bolk, CMA Duncan Dull),  July 16, 2010 11:51 AM    New/Updated Medications: PROAIR HFA 108 (90 BASE) MCG/ACT AERS (ALBUTEROL SULFATE) inhale by mouth 1-2 puffs q 6 hours as needed for wheezing. Prescriptions: PROAIR HFA 108 (90 BASE) MCG/ACT AERS (ALBUTEROL SULFATE) inhale by mouth 1-2 puffs q 6 hours as needed for wheezing.  #8.5 x 2   Entered by:   Romualdo Bolk, CMA (AAMA)   Authorized by:   Madelin Headings MD   Signed by:   Romualdo Bolk, CMA (AAMA) on 07/16/2010   Method used:   Electronically to        [facility]* (retail)       [address] [place], [place]  [address]       Ph: [phone] or [phone]       Fax: [phone]   RxID:   [id number] FLONASE 50 MCG/ACT  SUSP (FLUTICASONE PROPIONATE) use as needed  #1 x 2   Entered by:   Romualdo Bolk, CMA (AAMA)   Authorized by:   Madelin Headings MD   Signed by:   Romualdo Bolk, CMA (AAMA) on 07/16/2010   Method used:   Electronically to        [facility]* (retail)       [address] [place], [place]  [address]       Ph: [phone] or [phone]       Fax: [phone]   RxID:   [id number]

## 2010-12-22 NOTE — Progress Notes (Signed)
Summary: refill  Phone Note From Pharmacy   Caller: [facility]* Reason for Call: Needs renewal Details for Reason: Accu-Check Aviva Initial call taken by: Romualdo Bolk, CMA Duncan Dull),  Mar 23, 2010 4:31 PM  Follow-up for Phone Call        Rx sent to the pharmacy. Follow-up by: Romualdo Bolk, CMA Duncan Dull),  Mar 23, 2010 4:32 PM    Prescriptions: ACCU-CHEK AVIVA  STRP (GLUCOSE BLOOD) use as directed  #300 x 3   Entered by:   Romualdo Bolk, CMA (AAMA)   Authorized by:   Madelin Headings MD   Signed by:   Romualdo Bolk, CMA (AAMA) on 03/23/2010   Method used:   Electronically to        [facility]* (retail)       [address] [place], [place]  [address]       Ph: [phone] or [phone]       Fax: [phone]   RxID:   [id number]

## 2010-12-22 NOTE — Progress Notes (Signed)
Summary: refill on flonase  Phone Note From Pharmacy   Caller: [facility]* Reason for Call: Needs renewal Details for Reason: Fluticasone Initial call taken by: Romualdo Bolk, CMA Duncan Dull),  April 27, 2010 3:28 PM  Follow-up for Phone Call        Rx sent to pharmacy Follow-up by: Romualdo Bolk, CMA (AAMA),  April 27, 2010 3:30 PM    Prescriptions: FLONASE 50 MCG/ACT  SUSP (FLUTICASONE PROPIONATE) use as needed  #16 x 1   Entered by:   Romualdo Bolk, CMA (AAMA)   Authorized by:   Madelin Headings MD   Signed by:   Romualdo Bolk, CMA (AAMA) on 04/27/2010   Method used:   Electronically to        [facility]* (retail)       [address] [place], [place]  [address]       Ph: [phone] or [phone]       Fax: [phone]   RxID:   [id number]

## 2010-12-22 NOTE — Assessment & Plan Note (Signed)
Summary: FATIGUE AND SHORTNESS OF BREATH/CJR   Vital Signs:  Patient profile:   65 year old female Menstrual status:  hysterectomy Weight:      194 pounds O2 Sat:      97 % on Room air Temp:     98.6 degrees F oral Pulse rate:   101 / minute BP sitting:   140 / 80  (right arm) Cuff size:   regular  Vitals Entered By: Romualdo Bolk, CMA (AAMA) (January 19, 2010 10:51 AM)  O2 Flow:  Room air CC: Pt is having chest heaviness, heart fluttering, fatigue, sob that started 4-5 days ago and gets worse. Pt has also been getting dizzy as well. Pt is concerned that it could be anxiety. CBG Result 198   History of Present Illness: Joyce Richardson comesin today for   with husband for acute visit for insidious onset of DOIE that has become persistent and progressive  over the last week or so. No predating event . Has had mid chest tightness withis and some cough but not as bad as before.   No fever leg signs .  doesnt think its anxiety and pulse races at times?   100 range.    feels tired  when walking   No cp with this.   Still smoking .   inhaler made her feel bad in the past.   No hx of heart problems but is diabetic.   BG had been up and now down in the 160s  has had to take more humalogeu recently .  needs refill as runsout.   Preventive Screening-Counseling & Management  Alcohol-Tobacco     Alcohol drinks/day: 0     Smoking Status: current     Smoking Cessation Counseling: yes     Smoke Cessation Stage: precontemplative     Packs/Day: <0.25  Caffeine-Diet-Exercise     Caffeine use/day: 1     Does Patient Exercise: yes     Type of exercise: walk     Times/week: 7  EKG  Procedure date:  01/19/2010  Findings:      Normal sinus rhythm with rate of:  74  Current Medications (verified): 1)  Venlafaxine Hcl 75 Mg Xr24h-Tab (Venlafaxine Hcl) .... 2 By Mouth Once Daily 2)  Seroquel 100 Mg Tabs (Quetiapine Fumarate) .... 2 By Mouth Once Daily 3)  Premarin 0.625 Mg  Tabs  (Estrogens Conjugated) .Marland Kitchen.. 1 By Mouth Once Daily 4)  Lipitor 10 Mg  Tabs (Atorvastatin Calcium) .Marland Kitchen.. 1 By Mouth Once Daily 5)  Flonase 50 Mcg/act  Susp (Fluticasone Propionate) .... Use As Needed 6)  Accu-Chek Aviva  Strp (Glucose Blood) .... Use As Directed 7)  Humalog Kwikpen 100 Unit/ml Soln (Insulin Lispro (Human)) .... 4-8 U If Bcg > 200 Three Times A Day 8)  Promethazine Hcl 25 Mg  Supp (Promethazine Hcl) .Marland Kitchen.. 1 Every 8 Hrs As Needed 9)  Pen Needles 31g X 8 Mm Misc (Insulin Pen Needle) .... Use As Directed 10)  Lantus 100 Unit/ml Soln (Insulin Glargine) .... Take 14 Units Daily 11)  Multivitamins   Tabs (Multiple Vitamin) 12)  Keppra 250 Mg Tabs (Levetiracetam) .... Two Tabs Two Times A Day 13)  Vitamin B-12 1000 Mcg Tabs (Cyanocobalamin) 14)  Melatonin 3 Mg Tabs (Melatonin) 15)  Zofran 8 Mg Tabs (Ondansetron Hcl) .... As Needed 16)  Lomotil 2.5-0.025 Mg Tabs (Diphenoxylate-Atropine) 17)  Azithromycin 250 Mg Tabs (Azithromycin) .... Take 2 By Mouth Day 1 Then 1 By Mouth Once Daily  For 4 More Days. 18)  Ondansetron Hcl 8 Mg Tabs (Ondansetron Hcl) .Marland Kitchen.. 1 By Mouth Two Times A Day For Nausea 19)  Prednisone 20 Mg Tabs (Prednisone) .... 3 By Mouth Once Daily For 2 Days Then 2 By Mouth Once Daily For 3 Days or As Directed. 20)  Ventolin Hfa 108 (90 Base) Mcg/act Aers (Albuterol Sulfate) .Marland Kitchen.. 1-2 Puffs  Q 6 Hours As Needed Wheezing  Allergies (verified): 1)  ! Penicillin 2)  * All Narcotics 3)  * Alchol Base Drugs 4)  Hyoscyamine  Past History:  Past medical, surgical, family and social histories (including risk factors) reviewed, and no changes noted (except as noted below).  Past Medical History: Allergic rhinitis Depression per dr. Nolen Mu Diabetes mellitus, type II  on ARB for proteinuria Diverticulitis, hx of   GERD Hyperlipidemia Seizure disorder  on Lamictal Hyponatremia with tegretol   recovering alcoholic      [employer]  summer fall 2010 for narcotic  use IBS Benign brain tumor? acoustic neuroma  Consults Dr. Leone Payor Dr. Nolen Mu  Past Surgical History: Reviewed history from 11/19/2008 and no changes required. Cholecystectomy Hysterectomy and r oopherectomy for fibroids. Appendectomy hemorrhoidectomy  Past History:  Care Management: Neurology: Dr. Sandria Manly Gastroenterology: Dr. Leone Payor Psychiatry: Dr. Maurice Small: Norwood Levo  ----  Family History: Reviewed history from 01/21/2009 and no changes required. Family History of  father  2 suicides in family  Family History of Colon Cancer:Mothers sister  Family History of Diabetes: Both sides of family, parents, grandparents Family History of Heart Disease: Both sides of family, parents, grandparents  Social History: Reviewed history from 12/22/2009 and no changes required. Married 3rd time    Current Smoker 1 ppd  per day since in 42s attends AA Husband with ms on disability   getting worse. from [employer] originally. Recently   on disability  Daily Caffeine Use-1/3 cup daily Illicit Drug Use - no   Patient does not get regular exercise.  Packs/Day:  <0.25  Review of Systems       The patient complains of chest pain, dyspnea on exertion, and prolonged cough.  The patient denies anorexia, fever, weight loss, weight gain, vision loss, decreased hearing, syncope, peripheral edema, headaches, melena, hematochezia, severe indigestion/heartburn, hematuria, muscle weakness, transient blindness, unusual weight change, abnormal bleeding, enlarged lymph nodes, and angioedema.         seizure diorder and unable to do tandem gair from ? acoustic neuroma   Physical Exam  General:  alert, well-developed, well-nourished, and well-hydrated.   Head:  normocephalic and atraumatic.   Eyes:  vision grossly intact, pupils equal, and pupils round.   Ears:  R ear normal, L ear normal, and no external deformities.   Nose:  no external deformity and no external erythema.   Mouth:  pharynx pink  and moist.   Neck:  No deformities, masses, or tenderness noted. Lungs:  normal respiratory effort, no intercostal retractions, no accessory muscle use, no dullness, no fremitus, no crackles, and no wheezes.  except rare exp wheeze  Heart:  normal rate, regular rhythm, no murmur, no gallop, no rub, and no lifts.   Abdomen:  Bowel sounds positive,abdomen soft and non-tender without masses, organomegaly or hernias noted. Pulses:  pulses intact without delay   Extremities:  no clubbing cyanosis or edema  Neurologic:  alert & oriented X3.  no acute changes  Skin:  turgor normal, color normal, no ecchymoses, and no petechiae.   Cervical Nodes:  No lymphadenopathy noted Psych:  Oriented X3, good eye contact, not anxious appearing, and not depressed appearing.   EKG NSR no acute changes   Impression & Recommendations:  Problem # 1:  DYSPNEA/SHORTNESS OF BREATH (ICD-786.09) newer insidious onset       suspect pulmonary copd type cause   but r/o metabolic cardiac factors.  No obv acute infection today . had rx for same in Jan   still smoking and knows needs to stop.  Her updated medication list for this problem includes:    Ventolin Hfa 108 (90 Base) Mcg/act Aers (Albuterol sulfate) .Marland Kitchen... 1-2 puffs  q 6 hours as needed wheezing  Orders: Venipuncture (14782) TLB-BMP (Basic Metabolic Panel-BMET) (80048-METABOL) TLB-Lipid Panel (80061-LIPID) TLB-CBC Platelet - w/Differential (85025-CBCD) TLB-Hepatic/Liver Function Pnl (80076-HEPATIC) TLB-TSH (Thyroid Stimulating Hormone) (84443-TSH) TLB-BNP (B-Natriuretic Peptide) (83880-BNPR) Sedimentation Rate, non-automated (85651) TLB-A1C / Hgb A1C (Glycohemoglobin) (83036-A1C) TLB-T4 (Thyrox), Free [phone]) TLB-Microalbumin/Creat Ratio, Urine (82043-MALB) EKG w/ Interpretation (93000) Cardiology Referral (Cardiology) Pulmonary Referral (Pulmonary)  Problem # 2:  DIABETES MELLITUS, TYPE II (ICD-250.00)  Her updated medication list for this problem  includes:    Humalog Kwikpen 100 Unit/ml Soln (Insulin lispro (human)) ..... Use as a sliding scale as directed    Lantus 100 Unit/ml Soln (Insulin glargine) .Marland Kitchen... Take 14 units daily  Orders: Capillary Blood Glucose/CBG (57846) Venipuncture (96295) TLB-BMP (Basic Metabolic Panel-BMET) (80048-METABOL) TLB-Lipid Panel (80061-LIPID) TLB-CBC Platelet - w/Differential (85025-CBCD) TLB-Hepatic/Liver Function Pnl (80076-HEPATIC) TLB-TSH (Thyroid Stimulating Hormone) (84443-TSH) Sedimentation Rate, non-automated (85651) TLB-A1C / Hgb A1C (Glycohemoglobin) (83036-A1C) TLB-T4 (Thyrox), Free [phone]) TLB-Microalbumin/Creat Ratio, Urine (82043-MALB) Cardiology Referral (Cardiology)  Problem # 3:  TOBACCO USE (ICD-305.1)  disc cessation importance   Orders: Pulmonary Referral (Pulmonary)  Problem # 4:  HYPERTENSION (ICD-401.9)  Orders: Venipuncture (10272) TLB-BMP (Basic Metabolic Panel-BMET) (80048-METABOL) TLB-Lipid Panel (80061-LIPID) TLB-CBC Platelet - w/Differential (85025-CBCD) TLB-Hepatic/Liver Function Pnl (80076-HEPATIC) TLB-TSH (Thyroid Stimulating Hormone) (84443-TSH) Sedimentation Rate, non-automated (85651) TLB-A1C / Hgb A1C (Glycohemoglobin) (83036-A1C) TLB-T4 (Thyrox), Free (53664-QI3K) TLB-Microalbumin/Creat Ratio, Urine (82043-MALB) Cardiology Referral (Cardiology)  Problem # 5:  SEIZURE DISORDER (ICD-780.39)  Her updated medication list for this problem includes:    Keppra 250 Mg Tabs (Levetiracetam) .Marland Kitchen..Marland Kitchen Two tabs two times a day  Problem # 6:  HYPERLIPIDEMIA (ICD-272.4) due for labs  Her updated medication list for this problem includes:    Lipitor 10 Mg Tabs (Atorvastatin calcium) .Marland Kitchen... 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   Complete Medication List: 1)  Venlafaxine Hcl 75 Mg Xr24h-tab (Venlafaxine hcl) .... 2 by mouth once daily 2)  Seroquel 100 Mg Tabs (Quetiapine fumarate) .... 2 by mouth once daily 3)  Premarin 0.625 Mg Tabs (Estrogens conjugated) .Marland Kitchen.. 1 by mouth once daily 4)  Lipitor 10 Mg Tabs (Atorvastatin calcium) .Marland Kitchen.. 1 by mouth once daily 5)  Flonase 50 Mcg/act Susp (Fluticasone propionate) .... Use as needed 6)  Accu-chek Aviva Strp (Glucose blood) .... Use as directed 7)  Humalog Kwikpen 100 Unit/ml Soln (Insulin lispro (human)) .... Use as a sliding scale as directed 8)  Promethazine Hcl 25 Mg Supp (Promethazine hcl) .Marland Kitchen.. 1 every 8 hrs as needed 9)  Pen Needles 31g X 8 Mm Misc (Insulin pen needle) .... Use as directed 10)  Lantus 100 Unit/ml Soln (Insulin glargine) .... Take 14 units daily 11)  Multivitamins Tabs (Multiple vitamin) 12)  Keppra 250 Mg Tabs (Levetiracetam) .... Two tabs two times a day 13)  Vitamin B-12 1000 Mcg Tabs (Cyanocobalamin) 14)  Melatonin 3 Mg Tabs (Melatonin) 15)  Zofran 8 Mg Tabs (Ondansetron hcl) .... As needed 16)  Lomotil 2.5-0.025 Mg Tabs (Diphenoxylate-atropine) 17)  Azithromycin 250 Mg Tabs (Azithromycin) .... Take 2 by mouth day 1 then 1 by mouth once daily for 4 more days. 18)  Ondansetron Hcl 8 Mg Tabs (Ondansetron hcl) .Marland Kitchen.. 1 by mouth two times a day for nausea 19)  Prednisone 20 Mg Tabs (Prednisone) .... 3 by mouth once daily for 2 days then 2 by mouth once daily for 3 days or as directed. 20)  Ventolin Hfa 108 (90 Base) Mcg/act Aers (Albuterol sulfate) .Marland Kitchen.. 1-2 puffs  q 6 hours as needed wheezing  Patient Instructions: 1)  labs today. 2)  then plan   follow up 3)  echo  and pft s pulmonary  consults  as  indicated  Prescriptions: HUMALOG KWIKPEN 100 UNIT/ML SOLN (INSULIN LISPRO (HUMAN)) use as a sliding scale as directed  #2 bottles x 11   Entered by:   Romualdo Bolk, CMA (AAMA)   Authorized by:   Madelin Headings MD   Signed by:   Romualdo Bolk, CMA (AAMA) on 01/19/2010   Method used:   Electronically to        [facility]* (retail)       [address] [place], [place]  [address]       Ph: [phone] or [phone]       Fax: [phone]   RxID:   [id number]   Laboratory Results   Blood Tests   Date/Time Recieved: January 19, 2010 2:28 PM  Date/Time Reported: January 19, 2010 2:27 PM   CBG Random: 198 SED rate: 10  Comments: Wynona Canes, CMA  January 19, 2010 2:28 PM

## 2010-12-22 NOTE — Assessment & Plan Note (Signed)
Summary: wheezing and sob/ssc   Vital Signs:  Patient profile:   65 year old female Menstrual status:  hysterectomy Weight:      195 pounds O2 Sat:      97 % on Room air Temp:     98.5 degrees F oral Pulse rate:   102 / minute BP sitting:   138 / 78  (left arm) Cuff size:   regular  Vitals Entered By: Sid Falcon LPN (January 22, 1609 1:18 PM)  O2 Flow:  Room air CC: Wheezing, SOB   History of Present Illness: Acute visit.  Patient has known COPD and presents with increased shortness of breath and fatigue with increased cough productive of yellow sputum past few days. No fever. Wheezing off and on. Using Ventolin but feels jittery afterwards. Still smokes one quarter pack cigarettes per day.  Recent EKG unremarkable. Denies any exertional chest pain. She complains of a tightness across her chest which is constant and not related to activity. Recent BNP level normal. Patient been referred to cardiology and pulmonary and those referrals are pending.  Type 2 diabetes which has been poorly controlled. Patient monitors blood sugars regularly. On insulin.  Preventive Screening-Counseling & Management  Alcohol-Tobacco     Smoking Status: current     Packs/Day: 0.25     Year Started: 1980  Allergies: 1)  ! Penicillin 2)  * All Narcotics 3)  * Alchol Base Drugs 4)  Hyoscyamine  Past History:  Past Medical History: Last updated: 01/19/2010 Allergic rhinitis Depression per dr. Nolen Mu Diabetes mellitus, type II  on ARB for proteinuria Diverticulitis, hx of   GERD Hyperlipidemia Seizure disorder  on Lamictal Hyponatremia with tegretol   recovering alcoholic      [employer]  summer fall 2010 for narcotic use IBS Benign brain tumor? acoustic neuroma  Consults Dr. Leone Payor Dr. Nolen Mu  Past Surgical History: Last updated: 11/19/2008 Cholecystectomy Hysterectomy and r oopherectomy for fibroids. Appendectomy hemorrhoidectomy  Social History: Last updated:  12/22/2009 Married 3rd time    Current Smoker 1 ppd  per day since in 42s attends AA Husband with ms on disability   getting worse. from [employer] originally. Recently   on disability  Daily Caffeine Use-1/3 cup daily Illicit Drug Use - no   Patient does not get regular exercise.  PMH-FH-SH reviewed for relevance  Social History: Packs/Day:  0.25  Review of Systems      See HPI  Physical Exam  General:  Well-developed,well-nourished,in no acute distress; alert,appropriate and cooperative throughout examination Ears:  External ear exam shows no significant lesions or deformities.  Otoscopic examination reveals clear canals, tympanic membranes are intact bilaterally without bulging, retraction, inflammation or discharge. Hearing is grossly normal bilaterally. Mouth:  Oral mucosa and oropharynx without lesions or exudates.  Teeth in good repair. Neck:  No deformities, masses, or tenderness noted. Lungs:  faint expiratory wheezes but symmetric breath sounds. No retractions. No rales.O2 sat 97% room air Heart:  normal rate and regular rhythm.   Extremities:  no pitting edema   Impression & Recommendations:  Problem # 1:  DYSPNEA/SHORTNESS OF BREATH (ICD-786.09)  suspect secondary to COPD exacerbation. Give Depo-Medrol and monitor her blood sugars closely. Start antibiotic. Her updated medication list for this problem includes:    Ventolin Hfa 108 (90 Base) Mcg/act Aers (Albuterol sulfate) .Marland Kitchen... 1-2 puffs  q 6 hours as needed wheezing  Orders: Depo- Medrol 80mg  (J1040) Admin of Therapeutic Inj  intramuscular or subcutaneous (96045)  Complete Medication  List: 1)  Venlafaxine Hcl 75 Mg Xr24h-tab (Venlafaxine hcl) .... 2 by mouth once daily 2)  Seroquel 100 Mg Tabs (Quetiapine fumarate) .... 2 by mouth once daily 3)  Premarin 0.625 Mg Tabs (Estrogens conjugated) .Marland Kitchen.. 1 by mouth once daily 4)  Lipitor 10 Mg Tabs (Atorvastatin calcium) .Marland Kitchen.. 1 by mouth once daily 5)  Flonase 50  Mcg/act Susp (Fluticasone propionate) .... Use as needed 6)  Accu-chek Aviva Strp (Glucose blood) .... Use as directed 7)  Humalog Kwikpen 100 Unit/ml Soln (Insulin lispro (human)) .... Use as a sliding scale as directed 8)  Promethazine Hcl 25 Mg Supp (Promethazine hcl) .Marland Kitchen.. 1 every 8 hrs as needed 9)  Pen Needles 31g X 8 Mm Misc (Insulin pen needle) .... Use as directed 10)  Lantus 100 Unit/ml Soln (Insulin glargine) .... Take 14 units daily 11)  Multivitamins Tabs (Multiple vitamin) 12)  Keppra 250 Mg Tabs (Levetiracetam) .... Two tabs two times a day 13)  Vitamin B-12 1000 Mcg Tabs (Cyanocobalamin) 14)  Melatonin 3 Mg Tabs (Melatonin) 15)  Zofran 8 Mg Tabs (Ondansetron hcl) .... As needed 16)  Lomotil 2.5-0.025 Mg Tabs (Diphenoxylate-atropine) 17)  Azithromycin 250 Mg Tabs (Azithromycin) .... Take 2 by mouth day 1 then 1 by mouth once daily for 4 more days. 18)  Ondansetron Hcl 8 Mg Tabs (Ondansetron hcl) .Marland Kitchen.. 1 by mouth two times a day for nausea 19)  Prednisone 20 Mg Tabs (Prednisone) .... 3 by mouth once daily for 2 days then 2 by mouth once daily for 3 days or as directed. 20)  Ventolin Hfa 108 (90 Base) Mcg/act Aers (Albuterol sulfate) .Marland Kitchen.. 1-2 puffs  q 6 hours as needed wheezing  Patient Instructions: 1)  Stop smoking tips: Choose a quit date. Cut down before the quit date. Decide what you will do as a substitute when you feel the urge to smoke(gum, toothpick, exercise).  2)  follow up promptly if you develop any fever or worsening shortness of breath Prescriptions: AZITHROMYCIN 250 MG TABS (AZITHROMYCIN) take 2 by mouth day 1 then 1 by mouth once daily for 4 more days.  #6 x 0   Entered and Authorized by:   Evelena Peat MD   Signed by:   Evelena Peat MD on 01/22/2010   Method used:   Electronically to        [facility]* (retail)       [address] [place], [place]  [address]       Ph: [phone] or  [phone]       Fax: [phone]   RxID:   [id number]    Medication Administration  Injection # 1:    Medication: Depo- Medrol 80mg     Diagnosis: DYSPNEA/SHORTNESS OF BREATH (ICD-786.09)    Route: IM    Site: RUOQ gluteus    Exp Date: 09/22/2012    Lot #: [id number]    Mfr: Pharmacia    Patient tolerated injection without complications    Given by: Sid Falcon LPN (January 22, 6062 1:44 PM)  Orders Added: 1)  Est. Patient Level III [01601] 2)  Depo- Medrol 80mg  [J1040] 3)  Admin of Therapeutic Inj  intramuscular or subcutaneous [09323]

## 2010-12-22 NOTE — Progress Notes (Signed)
Summary: Pt wants a TSH done when she gets labs done tomorrow  Phone Note Call from Patient Call back at Home Phone [phone]   Caller: Patient Summary of Call: Pt is going to get hga1c done in am. Pt would like to have her TSH checked as well. Pt states that she is having alot of fatigue and weight gain. Initial call taken by: Romualdo Bolk, CMA (AAMA),  October 12, 2010 11:45 AM  Follow-up for Phone Call        please add on  TSH and Free T4   B12  MMA cbcdiff  dx  weith gain and fatigue. Follow-up by: Madelin Headings MD,  October 12, 2010 12:29 PM  Additional Follow-up for Phone Call Additional follow up Details #1::        Pt aware and orders placed in IDX Additional Follow-up by: Romualdo Bolk, CMA Duncan Dull),  October 12, 2010 1:39 PM

## 2010-12-22 NOTE — Consult Note (Signed)
Summary:  [facility]  [facility]   Imported By: Maryln Gottron 06/16/2010 13:00:15  _____________________________________________________________________  External Attachment:    Type:   Image     Comment:   External Document

## 2010-12-22 NOTE — Progress Notes (Signed)
Summary: refills  Phone Note From Pharmacy   Caller: [facility]* Reason for Call: Needs renewal Details for Reason: premarin 0.625 Initial call taken by: Romualdo Bolk, CMA Duncan Dull),  July 22, 2010 5:20 PM  Follow-up for Phone Call        Rx sent to pharmacy Follow-up by: Romualdo Bolk, CMA (AAMA),  July 22, 2010 5:20 PM    Prescriptions: PREMARIN 0.625 MG  TABS (ESTROGENS CONJUGATED) 1 by mouth once daily  #30 x 5   Entered by:   Romualdo Bolk, CMA (AAMA)   Authorized by:   Madelin Headings MD   Signed by:   Romualdo Bolk, CMA (AAMA) on 07/22/2010   Method used:   Electronically to        [facility]* (retail)       [address] [place], [place]  [address]       Ph: [phone] or [phone]       Fax: [phone]   RxID:   [id number]

## 2010-12-22 NOTE — Progress Notes (Signed)
Summary: refill on proair  Phone Note From Pharmacy   Caller: [facility]* Reason for Call: Needs renewal Details for Reason: proair Summary of Call: last filled on 05/13/10 Initial call taken by: Romualdo Bolk, CMA (AAMA),  August 13, 2010 9:38 AM  Follow-up for Phone Call        call in #1 with no rf Follow-up by: Nelwyn Salisbury MD,  August 13, 2010 10:04 AM    Prescriptions: PROAIR HFA 108 (90 BASE) MCG/ACT AERS (ALBUTEROL SULFATE) inhale by mouth 1-2 puffs q 6 hours as needed for wheezing.  #8.5 x 0   Entered by:   Romualdo Bolk, CMA (AAMA)   Authorized by:   Nelwyn Salisbury MD   Signed by:   Romualdo Bolk, CMA (AAMA) on 08/13/2010   Method used:   Electronically to        [facility]* (retail)       [address] [place], [place]  [address]       Ph: [phone] or [phone]       Fax: [phone]   RxID:   [id number]

## 2010-12-22 NOTE — Progress Notes (Signed)
Summary: refill on lipitor  Phone Note From Pharmacy   Caller: [facility]* Reason for Call: Needs renewal Details for Reason: Lipitor 10mg  Initial call taken by: Romualdo Bolk, CMA (AAMA),  March 16, 2010 9:40 AM  Follow-up for Phone Call        ok x 9 months  Follow-up by: Madelin Headings MD,  March 16, 2010 1:21 PM  Additional Follow-up for Phone Call Additional follow up Details #1::        Rx sent to pharmacy. Additional Follow-up by: Romualdo Bolk, CMA Duncan Dull),  March 16, 2010 3:25 PM    Prescriptions: LIPITOR 10 MG  TABS (ATORVASTATIN CALCIUM) 1 by mouth once daily  #30 x 8   Entered by:   Romualdo Bolk, CMA (AAMA)   Authorized by:   Madelin Headings MD   Signed by:   Romualdo Bolk, CMA (AAMA) on 03/16/2010   Method used:   Electronically to        [facility]* (retail)       [address] [place], [place]  [address]       Ph: [phone] or [phone]       Fax: [phone]   RxID:   [id number]

## 2010-12-22 NOTE — Progress Notes (Signed)
Summary: Pt wants to know if she suppose to get any labs done before ov  Phone Note Call from Patient Call back at Home Phone [phone]   Caller: Patient Summary of Call: Pt called and said she is sch for ov to see Dr. Fabian Sharp on 10/17/10, and is wanting to know if she is suppose to get any labs done before appt.  Initial call taken by: Lucy Antigua,  September 29, 2010 11:16 AM  Follow-up for Phone Call        Pt needs to have hga1c done prior to appt.  Follow-up by: Romualdo Bolk, CMA Duncan Dull),  September 29, 2010 11:42 AM  Additional Follow-up for Phone Call Additional follow up Details #1::        I called pt and sch labs for 10/13/10 at 8:10 [address].  Additional Follow-up by: Lucy Antigua,  September 30, 2010 8:26 AM

## 2010-12-22 NOTE — Progress Notes (Signed)
Summary: refills  Phone Note From Pharmacy   Caller: [facility]* Details for Reason: premarin, lipitor, and lantus Initial call taken by: Romualdo Bolk, CMA (AAMA),  February 26, 2010 10:44 AM  Follow-up for Phone Call        Rx sent to pharmacy. Follow-up by: Romualdo Bolk, CMA (AAMA),  February 26, 2010 10:45 AM    Prescriptions: LANTUS 100 UNIT/ML SOLN (INSULIN GLARGINE) Take 14 units daily  #10 x 0   Entered by:   Romualdo Bolk, CMA (AAMA)   Authorized by:   Madelin Headings MD   Signed by:   Romualdo Bolk, CMA (AAMA) on 02/26/2010   Method used:   Electronically to        [facility]* (retail)       [address] [place], [place]  [address]       Ph: [phone] or [phone]       Fax: [phone]   RxID:   [id number] LIPITOR 10 MG  TABS (ATORVASTATIN CALCIUM) 1 by mouth once daily  #30 x 0   Entered by:   Romualdo Bolk, CMA (AAMA)   Authorized by:   Madelin Headings MD   Signed by:   Romualdo Bolk, CMA (AAMA) on 02/26/2010   Method used:   Electronically to        [facility]* (retail)       [address] [place], [place]  [address]       Ph: [phone] or [phone]       Fax: [phone]   RxID:   [id number] PREMARIN 0.625 MG  TABS (ESTROGENS CONJUGATED) 1 by mouth once daily  #30 x 0   Entered by:   Romualdo Bolk, CMA (AAMA)   Authorized by:   Madelin Headings MD   Signed by:   Romualdo Bolk, CMA (AAMA) on 02/26/2010   Method used:   Electronically to        [facility]* (retail)       [address] [place], [place]  [address]       Ph: [phone] or [phone]       Fax: [phone]   RxID:   [id number]

## 2010-12-22 NOTE — Letter (Signed)
Summary: [facility]  [facility]   Imported By: Maryln Gottron 01/19/2010 15:33:12  _____________________________________________________________________  External Attachment:    Type:   Image     Comment:   External Document

## 2010-12-22 NOTE — Assessment & Plan Note (Signed)
Summary: Cardiology Nuclear Study  Nuclear Med Background Indications for Stress Test: Evaluation for Ischemia   History: COPD, Echo, Emphysema  History Comments: 3/11- Echo- EF= 55-60%. Moderate diastolic dysfunction/CHF. GERD  Symptoms: Chest Pressure, DOE, Fatigue    Nuclear Pre-Procedure Cardiac Risk Factors: Family History - CAD, Hypertension, IDDM Type 2, Lipids, Smoker Caffeine/Decaff Intake: None NPO After: 6:00 PM Lungs: clear IV 0.9% NS with Angio Cath: 24g     IV Site: (R) Hand IV Started by: Irean Hong RN Chest Size (in) 38     Cup Size C     Height (in): 60.5 Weight (lb): 196 BMI: 37.78  Nuclear Med Study 1 or 2 day study:  1 day     Stress Test Type:  Eugenie Birks Reading MD:  Marca Ancona, MD     Referring MD:  D.Mclean Resting Radionuclide:  Technetium 15m Tetrofosmin     Resting Radionuclide Dose:  11 mCi  Stress Radionuclide:  Technetium 108m Tetrofosmin     Stress Radionuclide Dose:  33 mCi   Stress Protocol Exercise Time (min):  2:54 min     Max HR:  127 bpm     Predicted Max HR:  155 bpm  Max Systolic BP: 163 mm Hg     Percent Max HR:  81.94 %     METS: 4.6 Rate Pressure Product:  [address]  Lexiscan: 0.4 mg   Stress Test Technologist:  Milana Na EMT-P     Nuclear Technologist:  Domenic Polite CNMT  Rest Procedure  Myocardial perfusion imaging was performed at rest 45 minutes following the intravenous administration of Myoview Technetium 12m Tetrofosmin.  Stress Procedure  The patient received IV Lexiscan 0.4 mg over 15-seconds.  Myoview injected at 30-seconds.  There were no significant changes with infusion.  Quantitative spect images were obtained after a 45 minute delay.  QPS Raw Data Images:  Normal; no motion artifact; normal heart/lung ratio. Stress Images:  Very small, mild mid anterior perfusion defect Rest Images:  Normal homogeneous uptake in all areas of the myocardium. Subtraction (SDS):  Very small, mild reversible defect in the  mid anterior wall.   Transient Ischemic Dilatation:  .83  (Normal <1.22)  Lung/Heart Ratio:  .37  (Normal <0.45)  Quantitative Gated Spect Images QGS EDV:  61 ml QGS ESV:  19 ml QGS EF:  68 % QGS cine images:  Normal wall motion.    Overall Impression  Exercise Capacity: Lexiscan study BP Response: Normal blood pressure response. Clinical Symptoms: Short of breath, dizzy ECG Impression: No significant ST segment change suggestive of ischemia. Overall Impression: Very small, mild reversible mid anterior defect is likely due to shifting breast attenuation.  SDS is only 1.  Overall Impression Comments: Probably normal study (low risk).   Appended Document: Cardiology Nuclear Study--DM 02-26-10 probably normal study.   Appended Document: Cardiology Nuclear Study appt with Dr Shirlee Latch 02-26-10

## 2010-12-22 NOTE — Assessment & Plan Note (Signed)
Summary: more SOB/dm   Vital Signs:  Patient profile:   65 year old female Menstrual status:  hysterectomy Weight:      192 pounds O2 Sat:      97 % on Room air Temp:     99.0 degrees F oral Pulse rate:   91 / minute BP sitting:   130 / 60  (right arm) Cuff size:   regular  Vitals Entered By: Romualdo Bolk, CMA (AAMA) (December 26, 2009 3:15 PM)  O2 Flow:  Room air CC: Pt still having sob that is worse when laying down. The wheezing comes and goes.    History of Present Illness: Joyce Richardson comesin today after reporting increasing sob when laying down. she has finished the antibiotic and the sinustis is better. However coughing and wheezing esp with laying down. No edema . No fever.   Non CP  NO hemoptysis and no change in Gi status.  No other meds . remote hx ? of inhaler use. .   DM : Sugars still controlled.   Preventive Screening-Counseling & Management  Alcohol-Tobacco     Alcohol drinks/day: 0     Smoking Status: current     Smoking Cessation Counseling: yes     Smoke Cessation Stage: precontemplative     Packs/Day: 1.0  Caffeine-Diet-Exercise     Caffeine use/day: 1     Does Patient Exercise: yes     Type of exercise: walk     Times/week: 7  Current Medications (verified): 1)  Venlafaxine Hcl 75 Mg Xr24h-Tab (Venlafaxine Hcl) .... 2 By Mouth Once Daily 2)  Seroquel 100 Mg Tabs (Quetiapine Fumarate) .... 2 By Mouth Once Daily 3)  Premarin 0.625 Mg  Tabs (Estrogens Conjugated) .Marland Kitchen.. 1 By Mouth Once Daily 4)  Lipitor 10 Mg  Tabs (Atorvastatin Calcium) .Marland Kitchen.. 1 By Mouth Once Daily 5)  Flonase 50 Mcg/act  Susp (Fluticasone Propionate) .... Use As Needed 6)  Accu-Chek Aviva  Strp (Glucose Blood) .... Use As Directed 7)  Humalog Kwikpen 100 Unit/ml Soln (Insulin Lispro (Human)) .... 4-8 U If Bcg > 200 Three Times A Day 8)  Promethazine Hcl 25 Mg  Supp (Promethazine Hcl) .Marland Kitchen.. 1 Every 8 Hrs As Needed 9)  Pen Needles 31g X 8 Mm Misc (Insulin Pen Needle) .... Use As  Directed 10)  Lantus 100 Unit/ml Soln (Insulin Glargine) .... Take 14 Units Daily 11)  Multivitamins   Tabs (Multiple Vitamin) 12)  Keppra 250 Mg Tabs (Levetiracetam) .... Two Tabs Two Times A Day 13)  Vitamin B-12 1000 Mcg Tabs (Cyanocobalamin) 14)  Melatonin 3 Mg Tabs (Melatonin) 15)  Zofran 8 Mg Tabs (Ondansetron Hcl) .... As Needed 16)  Lomotil 2.5-0.025 Mg Tabs (Diphenoxylate-Atropine) 17)  Azithromycin 250 Mg Tabs (Azithromycin) .... Take 2 By Mouth Day 1 Then 1 By Mouth Once Daily For 4 More Days. 18)  Ondansetron Hcl 8 Mg Tabs (Ondansetron Hcl) .Marland Kitchen.. 1 By Mouth Two Times A Day For Nausea  Allergies (verified): 1)  ! Penicillin 2)  * All Narcotics 3)  * Alchol Base Drugs 4)  Hyoscyamine  Past History:  Past medical, surgical, family and social histories (including risk factors) reviewed, and no changes noted (except as noted below).  Past Medical History: Reviewed history from 12/22/2009 and no changes required. Allergic rhinitis Depression per dr. 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 Leone Payor Dr. Nolen Mu  Past Surgical History: Reviewed history from 11/19/2008 and no changes required. Cholecystectomy Hysterectomy and r oopherectomy for fibroids. Appendectomy hemorrhoidectomy  Family History: Reviewed history from 01/21/2009 and no changes required. Family History of  father  2 suicides in family  Family History of Colon Cancer:Mothers sister  Family History of Diabetes: Both sides of family, parents, grandparents Family History of Heart Disease: Both sides of family, parents, grandparents  Social History: Reviewed history from 12/22/2009 and no changes required. Married 3rd time    Current Smoker 1 ppd  per day since in 31s attends  [employer] Husband with ms on disability   getting worse. from [place] [employer] originally. Recently   on disability  Daily Caffeine Use-1/3 cup daily Illicit Drug Use - no   Patient does not get regular exercise.   Review of Systems       The patient complains of prolonged cough.  The patient denies anorexia, fever, weight loss, weight gain, chest pain, syncope, dyspnea on exertion, peripheral edema, and hemoptysis.         malaise , no chills   sob when lays down.  no pnd  otherwise  .     Physical Exam  General:  Well-developed,well-nourished,in no acute distress; alert,appropriate and cooperative throughout examination Head:  normocephalic and atraumatic.   Eyes:  vision grossly intact.   Neck:  No deformities, masses, or tenderness noted. Lungs:  normal respiratory effort, no intercostal retractions, no accessory muscle use, and no dullness.  few  basilar wheezes  Heart:  normal rate, regular rhythm, and no murmur.   Pulses:  pulses intact without delay   Extremities:  no cce  Neurologic:  non focal  Skin:  turgor normal, color normal, no ecchymoses, and no petechiae.   Cervical Nodes:  No lymphadenopathy noted Psych:  Oriented X3, good eye contact, not anxious appearing, and not depressed appearing.   Additional Exam:  see x ray    Impression & Recommendations:  Problem # 1:  DYSPNEA/SHORTNESS OF BREATH (ICD-786.09) Assessment New ? post infectious and exac of copd?   although worse with position No evidence of CV compromise as a cause. and pulse ox is ok.   reviewed  cx ray with pt  not interested at present to get pfts bu consider in future.  prob copd signs    see  x ray cw this  .      monitor BGs while on steroids  . Expectant management .   Her updated medication list for this problem includes:    Ventolin Hfa 108 (90 Base) Mcg/act Aers (Albuterol sulfate) .Marland Kitchen... 1-2 puffs  q 6 hours as needed wheezing  Orders: Prescription Created Electronically [phone])  Problem # 2:  TOBACCO  USE (ICD-305.1) Assessment: Comment Only aggravator   pt aware   Problem # 3:  DIABETES MELLITUS, TYPE II (ICD-250.00) Assessment: Comment Only  Her updated medication list for this problem includes:    Humalog Kwikpen 100 Unit/ml Soln (Insulin lispro (human)) .Marland KitchenMarland KitchenMarland KitchenMarland Kitchen 4-8 u if bcg > 200 three times a day    Lantus 100 Unit/ml Soln (Insulin glargine) .Marland Kitchen... Take 14 units daily  Problem # 4:  BRONCHITIS, CHRONIC (ICD-491.9) Assessment: Comment Only  Orders: Prescription Created Electronically [id number])  Complete Medication List: 1)  Venlafaxine Hcl 75 Mg Xr24h-tab (Venlafaxine hcl) .... 2 by mouth once daily 2)  Seroquel 100 Mg Tabs (Quetiapine fumarate) .... 2  by mouth once daily 3)  Premarin 0.625 Mg Tabs (Estrogens conjugated) .Marland Kitchen.. 1 by mouth once daily 4)  Lipitor 10 Mg Tabs (Atorvastatin calcium) .Marland Kitchen.. 1 by mouth once daily 5)  Flonase 50 Mcg/act Susp (Fluticasone propionate) .... Use as needed 6)  Accu-chek Aviva Strp (Glucose blood) .... Use as directed 7)  Humalog Kwikpen 100 Unit/ml Soln (Insulin lispro (human)) .... 4-8 u if bcg > 200 three times a day 8)  Promethazine Hcl 25 Mg Supp (Promethazine hcl) .Marland Kitchen.. 1 every 8 hrs as needed 9)  Pen Needles 31g X 8 Mm Misc (Insulin pen needle) .... Use as directed 10)  Lantus 100 Unit/ml Soln (Insulin glargine) .... Take 14 units daily 11)  Multivitamins Tabs (Multiple vitamin) 12)  Keppra 250 Mg Tabs (Levetiracetam) .... Two tabs two times a day 13)  Vitamin B-12 1000 Mcg Tabs (Cyanocobalamin) 14)  Melatonin 3 Mg Tabs (Melatonin) 15)  Zofran 8 Mg Tabs (Ondansetron hcl) .... As needed 16)  Lomotil 2.5-0.025 Mg Tabs (Diphenoxylate-atropine) 17)  Azithromycin 250 Mg Tabs (Azithromycin) .... Take 2 by mouth day 1 then 1 by mouth once daily for 4 more days. 18)  Ondansetron Hcl 8 Mg Tabs (Ondansetron hcl) .Marland Kitchen.. 1 by mouth two times a day for nausea 19)  Prednisone 20 Mg Tabs (Prednisone) .... 3 by mouth once daily for 2 days then 2 by mouth  once daily for 3 days or as directed. 20)  Ventolin Hfa 108 (90 Base) Mcg/act Aers (Albuterol sulfate) .Marland Kitchen.. 1-2 puffs  q 6 hours as needed wheezing  Patient Instructions: 1)  take 5 days of prednisone   for wheezing  2)  monitor Blood sugars in the meantime. 3)  Inhaler if needed. 4)  expect improvement  within  couple days.  Prescriptions: VENTOLIN HFA 108 (90 BASE) MCG/ACT AERS (ALBUTEROL SULFATE) 1-2 puffs  q 6 hours as needed wheezing  #1 x 1   Entered and Authorized by:   Madelin Headings MD   Signed by:   Madelin Headings MD on 12/26/2009   Method used:   Electronically to        [facility]* (retail)       [address] [place], [place]  [address]       Ph: [phone] or [phone]       Fax: [phone]   RxID:   [id number] PREDNISONE 20 MG TABS (PREDNISONE) 3 by mouth once daily for 2 days then 2 by mouth once daily for 3 days or as directed.  #20 x 0   Entered and Authorized by:   Madelin Headings MD   Signed by:   Madelin Headings MD on 12/26/2009   Method used:   Electronically to        [facility]* (retail)       [address] [place], [place]  [address]       Ph: [phone] or [phone]       Fax: [phone]   RxID:   [id number]

## 2010-12-22 NOTE — Assessment & Plan Note (Signed)
Summary: 6 MO F/U   Primary Provider:  Berniece Andreas MD   History of Present Illness: 65 yo with history of hyperlipidemia, HTN, and diabetes presented initially for evaluation of shortness of breath.   She has COPD with mild obstruction on PFTs and notes frequent wheezing.  She has had significant weight gain over the last year.  She had an echo done showing preserved systolic function but moderate diastolic dysfunction.  Lexiscan myoview (3/11) was likely normal with anterior attenuation.   I started the patient on a diuretic trial with Lasix.  She says that her shortness of breath and chest heaviness improved significantly.  She is able to walk about 1/2 mile now before she is short of breath.  Wheezing decreased with use of Symbicort.  BP remains under good control today.  She continues to smoke about 2 cigarettes a day.   Labs (2/11): LDL 94, HDL 64, creatinine 0.8, BNP 21 Labs (4/11): K 4.1, creatinine 0.9  Current Medications (verified): 1)  Venlafaxine Hcl 75 Mg Xr24h-Tab (Venlafaxine Hcl) .... 2 By Mouth Once Daily 2)  Seroquel 100 Mg Tabs (Quetiapine Fumarate) .... 2 By Mouth Once Daily 3)  Premarin 0.625 Mg  Tabs (Estrogens Conjugated) .Marland Kitchen.. 1 By Mouth Once Daily 4)  Lipitor 10 Mg  Tabs (Atorvastatin Calcium) .Marland Kitchen.. 1 By Mouth Once Daily 5)  Flonase 50 Mcg/act  Susp (Fluticasone Propionate) .... Use As Needed 6)  Accu-Chek Aviva  Strp (Glucose Blood) .... Use As Directed 7)  Humalog 100 Unit/ml Soln (Insulin Lispro (Human)) .... Use As A Sliding Scale 8)  Lantus 100 Unit/ml Soln (Insulin Glargine) .... Take 14 Units Daily 9)  Multivitamins   Tabs (Multiple Vitamin) .... Once Daily 10)  Keppra 250 Mg Tabs (Levetiracetam) .... Two Tabs Two Times A Day 11)  Vitamin B-12 1000 Mcg Tabs (Cyanocobalamin) .... Once Daily 12)  Zofran 8 Mg Tabs (Ondansetron Hcl) .... As Needed 13)  Symbicort 160-4.5 Mcg/act Aero (Budesonide-Formoterol Fumarate) .... 2 Sprays Two Times A Day   or As  Directed 14)  Furosemide 20 Mg Tabs (Furosemide) .... One Tablet Daily 15)  Potassium Chloride Cr 10 Meq Cr-Caps (Potassium Chloride) .... Take One Tablet By Mouth Daily 16)  Aspirin 81 Mg Tbec (Aspirin) .... Take One Tablet By Mouth Daily 17)  Losartan Potassium 25 Mg Tabs (Losartan Potassium) .... One Tablet Daily 18)  Proair Hfa 108 (90 Base) Mcg/act Aers (Albuterol Sulfate) .... Inhale By Mouth 1-2 Puffs Q 6 Hours As Needed For Wheezing. 19)  Bd Insulin Syringe Ultrafine 29g X 1/2" 0.3 Ml Misc (Insulin Syringe-Needle U-100) .... Use As Directed 20)  Bd Insulin Syringe Ultrafine 31g X 5/16" 0.3 Ml Misc (Insulin Syringe-Needle U-100) .... Use As Directed  Allergies (verified): 1)  ! Penicillin 2)  * All Narcotics 3)  * Alchol Base Drugs 4)  Hyoscyamine  Past History:  Past Medical History: Reviewed history from 07/21/2010 and no changes required. 1. HYPERTENSION (ICD-401.9) 2. HYPERLIPIDEMIA (ICD-272.4) 3. 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 Hx of NARCOTIC ABUSE (ICD-305.90): [facility] summer 2010.  7. DIVERTICULOSIS-COLON (ICD-562.10) 8. IBS (ICD-564.1) 9. COPD (ICD-496): PFTs (3/11) FVC 80%, FEV1 74%, ratio 66%, TLC 90%, DLCO 54%. Mild obstruction, some response to bronchodilator.  10. TOBACCO USE (ICD-305.1) 11. ABUSE, ALCOHOL, IN REMISSION (ICD-305.03) 12. SEIZURE DISORDER (ICD-780.39) 13. GERD (ICD-530.81) 14. DIABETES MELLITUS, TYPE II (ICD-250.00)   15. DEPRESSION (ICD-311)per dr. Nolen Mu 16. ALLERGIC RHINITIS (ICD-477.9) 17. Hyponatremia with tegretol   18. Diastolic  CHF: Echo (3/11) with EF 55-60%, moderate (grade II) diastolic dysfunction, mild LAE 19. Lexiscan myoview (3/11): likely normal with anterior attenuation.  EF 68%.  Low risk study.   Consults Dr. Leone Payor Dr. Nolen Mu  Family History: Reviewed history from 01/21/2009 and no changes required. Family History of  father  2 suicides in family  Family History of Colon  Cancer:Mothers sister  Family History of Diabetes: Both sides of family, parents, grandparents Family History of Heart Disease: Both sides of family, parents, grandparents  Social History: Married 3rd time    Current Smoker 1 ppd  per day since in 61s down now to 2 cigs/day attends AA Husband with MS on disability getting worse.   in [facility], [facility], [facility] a good bit  from [place] originally. Former Engineer, civil (consulting) Daily Caffeine Use-1/3 cup daily Illicit Drug Use - no   Patient does not get regular exercise.   Vital Signs:  Patient profile:   65 year old female Menstrual status:  hysterectomy Height:      60.5 inches Weight:      214 pounds BMI:     41.25 Pulse rate:   92 / minute Resp:     18 per minute BP sitting:   132 / 84  (left arm)  Vitals Entered By: Marrion Coy, CNA (September 21, 2010 9:32 AM)  Physical Exam  General:  Well developed, well nourished, in no acute distress. Obese.  Neck:  Neck supple, no JVD. No masses, thyromegaly or abnormal cervical nodes. Lungs:  Slight end expiratory wheezes, otherwise clear.  Heart:  Non-displaced PMI, chest non-tender; regular rate and rhythm, S1, S2 without murmurs, rubs or gallops. Carotid upstroke normal, no bruit. Pedals normal pulses. Trace ankle edema.  Abdomen:  Bowel sounds positive; abdomen soft and non-tender without masses, organomegaly, or hernias noted. No hepatosplenomegaly. Extremities:  No clubbing or cyanosis. Neurologic:  Alert and oriented x 3. Psych:  Normal affect.   Impression & Recommendations:  Problem # 1:  CHRONIC DIASTOLIC HEART FAILURE (ICD-428.32) Patient has been doing well on Lasix.  Her weight is stable.  Would continue current Lasix/KCl.  I do think that her dyspnea is multifactorial, besides chronic diastolic CHF, I suspect that there is a component of obesity/deconditioning as well as COPD/bronchospasm.   Problem # 2:  HYPERTENSION (ICD-401.9) BP well-controlled.   Problem # 3:  HYPERLIPIDEMIA  (ICD-272.4) Lipids/LFTs next month.   Problem # 4:  TOBACCO USE (ICD-305.1) I encouraged her to stop smoking completely.   Patient Instructions: 1)  Your physician wants you to follow-up in: 1 year with Dr Shirlee Latch.   You will receive a reminder letter in the mail two months in advance. If you don't receive a letter, please call our office to schedule the follow-up appointment.

## 2010-12-22 NOTE — Assessment & Plan Note (Signed)
Summary: REVIEW OF LABWORK // RS---PT [facility] // RS   Vital Signs:  Patient profile:   65 year old female Menstrual status:  hysterectomy Weight:      207 pounds O2 Sat:      97 % on Room air Pulse rate:   81 / minute BP sitting:   120 / 70  (right arm) Cuff size:   large  Vitals Entered By: Romualdo Bolk, CMA (AAMA) (July 21, 2010 8:33 AM)  O2 Flow:  Room air CC: Follow-up visit on labs, Hypertension Management   History of Present Illness: Joyce Richardson comes in for follow up of multiple medical problems  Since last visit she is ok. Her allergies are bothering her but denies lung decompensation signs .   PulmSymbicort and     proair as needed.     feels pretty good  syas can walk 1/2 mile before stopping  Allergies in  fall are problematic with sneexing and eyes itch  but on flonase .  ? what to try.  Dm:     ocas lows   using OJ.     ocass rare.    only on lantus 14 and sliding scale with humalog   . Injetion stings at times  no other se.    Calls the lantus people about this and gets advice .Ttinnitus:   had eval poss from DM    stable  felt no further workup currently  Tobacco   few a day.   tries to stop BP good Neuro:  to see dr love soon;  no seizures .    Hypertension History:      She complains of dyspnea with exertion and orthopnea, but denies headache, chest pain, palpitations, PND, peripheral edema, visual symptoms, neurologic problems, syncope, and side effects from treatment.  She notes no problems with any antihypertensive medication side effects.        Positive major cardiovascular risk factors include female age 83 years old or older, diabetes, hyperlipidemia, hypertension, and current tobacco user.        Positive history for target organ damage include cardiac end organ damage (either CHF or LVH).      Preventive Screening-Counseling & Management  Alcohol-Tobacco     Alcohol drinks/day: 0     Smoking Status: current     Smoking Cessation  Counseling: yes     Smoke Cessation Stage: precontemplative     Packs/Day: <0.25     Year Started: 1980     Tobacco Counseling: to quit use of tobacco products  Caffeine-Diet-Exercise     Caffeine use/day: 1     Does Patient Exercise: yes     Type of exercise: walk     Exercise (avg: min/session): 2:54     Times/week: 7  Current Medications (verified): 1)  Venlafaxine Hcl 75 Mg Xr24h-Tab (Venlafaxine Hcl) .... 2 By Mouth Once Daily 2)  Seroquel 100 Mg Tabs (Quetiapine Fumarate) .... 2 By Mouth Once Daily 3)  Premarin 0.625 Mg  Tabs (Estrogens Conjugated) .Marland Kitchen.. 1 By Mouth Once Daily 4)  Lipitor 10 Mg  Tabs (Atorvastatin Calcium) .Marland Kitchen.. 1 By Mouth Once Daily 5)  Flonase 50 Mcg/act  Susp (Fluticasone Propionate) .... Use As Needed 6)  Accu-Chek Aviva  Strp (Glucose Blood) .... Use As Directed 7)  Humalog 100 Unit/ml Soln (Insulin Lispro (Human)) .... Use As A Sliding Scale 8)  Lantus 100 Unit/ml Soln (Insulin Glargine) .... Take 14 Units Daily 9)  Multivitamins   Tabs (Multiple  Vitamin) .... Once Daily 10)  Keppra 250 Mg Tabs (Levetiracetam) .... Two Tabs Two Times A Day 11)  Vitamin B-12 1000 Mcg Tabs (Cyanocobalamin) .... Once Daily 12)  Zofran 8 Mg Tabs (Ondansetron Hcl) .... As Needed 13)  Symbicort 160-4.5 Mcg/act Aero (Budesonide-Formoterol Fumarate) .... 2 Sprays Two Times A Day   or As Directed 14)  Furosemide 20 Mg Tabs (Furosemide) .... One Tablet Daily 15)  Potassium Chloride Cr 10 Meq Cr-Caps (Potassium Chloride) .... Take One Tablet By Mouth Daily 16)  Aspirin 81 Mg Tbec (Aspirin) .... Take One Tablet By Mouth Daily 17)  Losartan Potassium 25 Mg Tabs (Losartan Potassium) .... One Tablet Daily 18)  Zofran 4 Mg Tabs (Ondansetron Hcl) .Marland Kitchen.. 1-2  Q6-8 Hours As Needed Nausea 19)  Proair Hfa 108 (90 Base) Mcg/act Aers (Albuterol Sulfate) .... Inhale By Mouth 1-2 Puffs Q 6 Hours As Needed For Wheezing. 20)  Bd Insulin Syringe Ultrafine 29g X 1/2" 0.3 Ml Misc (Insulin Syringe-Needle  U-100) .... Use As Directed  Allergies (verified): 1)  ! Penicillin 2)  * All Narcotics 3)  * Alchol Base Drugs 4)  Hyoscyamine  Past History:  Past medical, surgical, family and social histories (including risk factors) reviewed, and no changes noted (except as noted below).  Past Medical History: 1. HYPERTENSION (ICD-401.9) 2. HYPERLIPIDEMIA (ICD-272.4) 3. 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 Hx of NARCOTIC ABUSE (ICD-305.90): [facility] summer 2010.  7. DIVERTICULOSIS-COLON (ICD-562.10) 8. IBS (ICD-564.1) 9. COPD (ICD-496): PFTs (3/11) FVC 80%, FEV1 74%, ratio 66%, TLC 90%, DLCO 54%. Mild obstruction, some response to bronchodilator.  10. TOBACCO USE (ICD-305.1) 11. ABUSE, ALCOHOL, IN REMISSION (ICD-305.03) 12. SEIZURE DISORDER (ICD-780.39) 13. GERD (ICD-530.81) 14. DIABETES MELLITUS, TYPE II (ICD-250.00)   15. DEPRESSION (ICD-311)per dr. Nolen Mu 16. ALLERGIC RHINITIS (ICD-477.9) 17. Hyponatremia with tegretol   18. Diastolic CHF: Echo (3/11) with EF 55-60%, moderate (grade II) diastolic dysfunction, mild LAE 19. Lexiscan myoview (3/11): likely normal with anterior attenuation.  EF 68%.  Low risk study.   Consults Dr. Leone Payor Dr. Nolen Mu  Past Surgical History: Reviewed history from 11/19/2008 and no changes required. Cholecystectomy Hysterectomy and r oopherectomy for fibroids. Appendectomy hemorrhoidectomy  Past History:  Care Management: Neurology: Dr. Sandria Manly Gastroenterology: Dr. Leone Payor Psychiatry: Dr. Maurice Small: [place]wood Levo  ---- Cardiology:[facility]  Family History: Reviewed history from 01/21/2009 and no changes required. Family History of  father  2 suicides in family  Family History of Colon Cancer:Mothers sister  Family History of Diabetes: Both sides of family, parents, grandparents Family History of Heart Disease: Both sides of family, parents, grandparents  Social History: Reviewed history from 02/10/2010 and no  changes required. Married 3rd time    Current Smoker 1 ppd  per day since in 35s down now to 3 cigs/day attends AA Husband with MS on disability getting worse.   in [facility] a good bit  from [place] originally. Former Engineer, civil (consulting) Daily Caffeine Use-1/3 cup daily Illicit Drug Use - no   Patient does not get regular exercise.   Review of Systems  The patient denies anorexia, fever, weight loss, weight gain, hoarseness, chest pain, syncope, dyspnea on exertion, peripheral edema, prolonged cough, abdominal pain, melena, muscle weakness, transient blindness, difficulty walking, abnormal bleeding, enlarged lymph nodes, and angioedema.         no change hearing tinnitus    Impression & Recommendations:  Problem # 1:  DIABETES MELLITUS, TYPE II (ICD-250.00) needs better control ... ocass  low s  increase lantus   to control fbs first  using as  humalog on a asliding scale      will be getting eye exam soon  flu shot today  Her updated medication list for this problem includes:    Humalog 100 Unit/ml Soln (Insulin lispro (human)) ..... Use as a sliding scale    Lantus 100 Unit/ml Soln (Insulin glargine) .Marland Kitchen... Take 14 units daily    Aspirin 81 Mg Tbec (Aspirin) .Marland Kitchen... Take one tablet by mouth daily    Losartan Potassium 25 Mg Tabs (Losartan potassium) ..... One tablet daily  Labs Reviewed: Creat: 0.9 (02/26/2010)     Last Eye Exam: normal (01/20/2009) Reviewed HgBA1c results: 8.3 (07/16/2010)  9.2 (01/19/2010)  Problem # 2:  HYPERTENSION (ICD-401.9)  Her updated medication list for this problem includes:    Furosemide 20 Mg Tabs (Furosemide) ..... One tablet daily    Losartan Potassium 25 Mg Tabs (Losartan potassium) ..... One tablet daily  Problem # 3:  ALLERGIC RHINITIS (ICD-477.9) fall allergies problematic  Her updated medication list for this problem includes:    Flonase 50 Mcg/act Susp (Fluticasone propionate) ..... Use as needed  Problem # 4:  COPD (ICD-496)  Her updated medication  list for this problem includes:    Symbicort 160-4.5 Mcg/act Aero (Budesonide-formoterol fumarate) .Marland Kitchen... 2 sprays two times a day   or as directed    Proair Hfa 108 (90 Base) Mcg/act Aers (Albuterol sulfate) ..... Inhale by mouth 1-2 puffs q 6 hours as needed for wheezing.  Pulmonary Functions Reviewed: O2 sat: 97 (07/21/2010)     Vaccines Reviewed: Pneumovax: Pneumovax (03/24/2009)   Flu Vax: Historical (08/16/2009)  Problem # 5:  TOBACCO USE (ICD-305.1) decreasing to a few per day    continiue to try to stop   Problem # 6:  SEIZURE DISORDER (ICD-780.39) stable no seizures  Her updated medication list for this problem includes:    Keppra 250 Mg Tabs (Levetiracetam) .Marland Kitchen..Marland Kitchen Two tabs two times a day  Problem # 7:  CHRONIC DIASTOLIC HEART FAILURE (ICD-428.32) stable  Her updated medication list for this problem includes:    Furosemide 20 Mg Tabs (Furosemide) ..... One tablet daily    Aspirin 81 Mg Tbec (Aspirin) .Marland Kitchen... Take one tablet by mouth daily    Losartan Potassium 25 Mg Tabs (Losartan potassium) ..... One tablet daily  Complete Medication List: 1)  Venlafaxine Hcl 75 Mg Xr24h-tab (Venlafaxine hcl) .... 2 by mouth once daily 2)  Seroquel 100 Mg Tabs (Quetiapine fumarate) .... 2 by mouth once daily 3)  Premarin 0.625 Mg Tabs (Estrogens conjugated) .Marland Kitchen.. 1 by mouth once daily 4)  Lipitor 10 Mg Tabs (Atorvastatin calcium) .Marland Kitchen.. 1 by mouth once daily 5)  Flonase 50 Mcg/act Susp (Fluticasone propionate) .... Use as needed 6)  Accu-chek Aviva Strp (Glucose blood) .... Use as directed 7)  Humalog 100 Unit/ml Soln (Insulin lispro (human)) .... Use as a sliding scale 8)  Lantus 100 Unit/ml Soln (Insulin glargine) .... Take 14 units daily 9)  Multivitamins Tabs (Multiple vitamin) .... Once daily 10)  Keppra 250 Mg Tabs (Levetiracetam) .... Two tabs two times a day 11)  Vitamin B-12 1000 Mcg Tabs (Cyanocobalamin) .... Once daily 12)  Zofran 8 Mg Tabs (Ondansetron hcl) .... As needed 13)   Symbicort 160-4.5 Mcg/act Aero (Budesonide-formoterol fumarate) .... 2 sprays two times a day   or as directed 14)  Furosemide 20 Mg Tabs (Furosemide) .... One tablet daily 15)  Potassium Chloride Cr 10 Meq Cr-caps (  Potassium chloride) .... Take one tablet by mouth daily 16)  Aspirin 81 Mg Tbec (Aspirin) .... Take one tablet by mouth daily 17)  Losartan Potassium 25 Mg Tabs (Losartan potassium) .... One tablet daily 18)  Zofran 4 Mg Tabs (Ondansetron hcl) .Marland Kitchen.. 1-2  q6-8 hours as needed nausea 19)  Proair Hfa 108 (90 Base) Mcg/act Aers (Albuterol sulfate) .... Inhale by mouth 1-2 puffs q 6 hours as needed for wheezing. 20)  Bd Insulin Syringe Ultrafine 29g X 1/2" 0.3 Ml Misc (Insulin syringe-needle u-100) .... Use as directed  Other Orders: Admin 1st Vaccine (40981) Flu Vaccine 24yrs + (19147)  Hypertension Assessment/Plan:      The patient's hypertensive risk group is category C: Target organ damage and/or diabetes.  Her calculated 10 year risk of coronary heart disease is 13 %.  Today's blood pressure is 120/70.  Her blood pressure goal is < 130/80.  Patient Instructions: 1)  trying  claritin ,  zyrtec, and  allegra OTC  in addition to other for your allergic. 2)  Increase lantus  2-4 intis every 3-4 days until fasting BG at range 80-120 range  3)  call with ?s   4)  Hg a1c in 3 months and ROV .          Flu Vaccine Consent Questions     Do you have a history of severe allergic reactions to this vaccine? no    Any prior history of allergic reactions to egg and/or gelatin? no    Do you have a sensitivity to the preservative Thimersol? no    Do you have a past history of Guillan-Barre Syndrome? no    Do you currently have an acute febrile illness? no    Have you ever had a severe reaction to latex? no    Vaccine information given and explained to patient? yes    Are you currently pregnant? no    Lot Number:[id number]   Exp Date:05/22/2011   Site Given  Left Deltoid  IMbflu Romualdo Bolk, CMA (AAMA)  July 21, 2010 11:40 AM

## 2010-12-22 NOTE — Progress Notes (Signed)
Summary: BS 600  Phone Note Call from Patient Call back at Home Phone [phone]   Caller: Patient and husband Summary of Call: Spoke with pt who was not acting clearly BS 600 1 1/2 hours and it dropped down to 472. Pt states that she took humalog 30u and lantus but I was unclear of what she took or how much. Dr. Fabian Sharp aware and advised to go to the ED. I spoke to pt's husband and told him this. Initial call taken by: Romualdo Bolk, CMA (AAMA),  December 29, 2009 12:36 PM     Appended Document: BS 600 Pt called back saying that she is at [facility]. She also said that she did dip her urine and it showed blood and well as high blood sugar. Pt's husband said that she was wanting to leave. I told pt and husband both that she needs to stay and be evaluated. They said okay. Pt seemed to be acting more clearly than she did at 12:36pm.

## 2010-12-22 NOTE — Progress Notes (Signed)
Summary: refills  Phone Note From Pharmacy   Caller: [facility]* Reason for Call: Needs renewal Details for Reason: lantus Initial call taken by: Romualdo Bolk, CMA (AAMA),  July 23, 2010 11:39 AM  Follow-up for Phone Call        Rx sent to pharmacy. Follow-up by: Romualdo Bolk, CMA (AAMA),  July 23, 2010 11:41 AM    Prescriptions: LANTUS 100 UNIT/ML SOLN (INSULIN GLARGINE) Take 14 units daily  #10 x 2   Entered by:   Romualdo Bolk, CMA (AAMA)   Authorized by:   Madelin Headings MD   Signed by:   Romualdo Bolk, CMA (AAMA) on 07/23/2010   Method used:   Electronically to        [facility]* (retail)       [address] [place], [place]  [address]       Ph: [phone] or [phone]       Fax: [phone]   RxID:   [id number]

## 2010-12-22 NOTE — Assessment & Plan Note (Signed)
Summary: per check out/sf   Primary Provider:  Berniece Andreas MD   History of Present Illness: 65 yo with history of hyperlipidemia, HTN, and diabetes presented initially for evaluation of shortness of breath.  Patient has had increased shortness of breath beginning about 4 months ago.  She has COPD with mild obstruction on PFTs.  She coughs at night and wheezes.  Her chest feels heavy when she is wheezing.  She has taken prednisone and reports that this helps her breathing and relieves the chest heaviness.  She started Symbicort recently.  No orthopnea but short of breath walking her dogs in the yard.  She additionally has had a 20 lb weight gain in the last 6 months (has cut back on smoking and is eating more).  She had an echo done showing preserved systolic function but moderate diastolic dysfunction.  Lexiscan myoview (3/11) was likely normal with anterior attenuation.    I started the patient on a diuretic trial with Lasix.  She says that her shortness of breath and chest heaviness has actually improved significantly.  She is able to walk about 1/4 mile now before she is short of breath.  BP at home has been running 110-144 systolic.  Labs (2/11): LDL 94, HDL 64, creatinine 0.8, BNP 21  Current Medications (verified): 1)  Venlafaxine Hcl 75 Mg Xr24h-Tab (Venlafaxine Hcl) .... 2 By Mouth Once Daily 2)  Seroquel 100 Mg Tabs (Quetiapine Fumarate) .... 2 By Mouth Once Daily 3)  Premarin 0.625 Mg  Tabs (Estrogens Conjugated) .Marland Kitchen.. 1 By Mouth Once Daily 4)  Lipitor 10 Mg  Tabs (Atorvastatin Calcium) .Marland Kitchen.. 1 By Mouth Once Daily 5)  Flonase 50 Mcg/act  Susp (Fluticasone Propionate) .... Use As Needed 6)  Accu-Chek Aviva  Strp (Glucose Blood) .... Use As Directed 7)  Humalog Kwikpen 100 Unit/ml Soln (Insulin Lispro (Human)) .... Use As A Sliding Scale As Directed 8)  Pen Needles 31g X 8 Mm Misc (Insulin Pen Needle) .... Use As Directed 9)  Lantus 100 Unit/ml Soln (Insulin Glargine) .... Take 14 Units  Daily 10)  Multivitamins   Tabs (Multiple Vitamin) .... Once Daily 11)  Keppra 250 Mg Tabs (Levetiracetam) .... Two Tabs Two Times A Day 12)  Vitamin B-12 1000 Mcg Tabs (Cyanocobalamin) .... Once Daily 13)  Zofran 8 Mg Tabs (Ondansetron Hcl) .... As Needed 14)  Symbicort 160-4.5 Mcg/act Aero (Budesonide-Formoterol Fumarate) .... 2 Sprays Two Times A Day   or As Directed 15)  Furosemide 20 Mg Tabs (Furosemide) .... One Tablet Daily 16)  Potassium Chloride Cr 10 Meq Cr-Caps (Potassium Chloride) .... Take One Tablet By Mouth Daily 17)  Aspirin 81 Mg Tbec (Aspirin) .... Take One Tablet By Mouth Daily  Allergies (verified): 1)  ! Penicillin 2)  * All Narcotics 3)  * Alchol Base Drugs 4)  Hyoscyamine  Past History:  Past Medical History: 1. HYPERTENSION (ICD-401.9) 2. HYPERLIPIDEMIA (ICD-272.4) 3. 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 Hx of NARCOTIC ABUSE (ICD-305.90): [facility] 2010.  6. ? of INFLAMMATORY BOWEL DISEASE (ICD-558.9) 7. DIVERTICULOSIS-COLON (ICD-562.10) 8. IBS (ICD-564.1) 9. COPD (ICD-496): PFTs (3/11) FVC 80%, FEV1 74%, ratio 66%, TLC 90%, DLCO 54%. Mild obstruction, some response to bronchodilator.  10. TOBACCO USE (ICD-305.1) 11. ABUSE, ALCOHOL, IN REMISSION (ICD-305.03) 12. SEIZURE DISORDER (ICD-780.39) 13. GERD (ICD-530.81) 14. DIABETES MELLITUS, TYPE II (ICD-250.00)   15. DEPRESSION (ICD-311)per dr. Nolen Mu 16. ALLERGIC RHINITIS (ICD-477.9) 17. Hyponatremia with tegretol   18. Diastolic CHF: Echo (3/11) with  EF 55-60%, moderate (grade II) diastolic dysfunction, mild LAE 19. Lexiscan myoview (3/11): likely normal with anterior attenuation.  EF 68%.  Low risk study.   Consults Dr. Leone Payor Dr. Nolen Mu  Family History: Reviewed history from 01/21/2009 and no changes required. Family History of  father  2 suicides in family  Family History of Colon Cancer:Mothers sister  Family History of Diabetes: Both sides of family,  parents, grandparents Family History of Heart Disease: Both sides of family, parents, grandparents  Social History: Reviewed history from 02/10/2010 and no changes required. Married 3rd time    Current Smoker 1 ppd  per day since in 46s down now to 3 cigs/day attends AA Husband with MS on disability getting worse.  from [place] originally. Former Engineer, civil (consulting) Daily Caffeine Use-1/3 cup daily Illicit Drug Use - no   Patient does not get regular exercise.   Review of Systems       All systems reviewed and negative except as per HPI.   Vital Signs:  Patient profile:   65 year old female Menstrual status:  hysterectomy Height:      60.5 inches Weight:      198 pounds BMI:     38.17 Pulse rate:   87 / minute Pulse rhythm:   regular BP sitting:   137 / 68  (left arm) Cuff size:   large  Vitals Entered By: Judithe Modest CMA (February 26, 2010 8:31 AM)  Physical Exam  General:  Well developed, well nourished, in no acute distress.  Obese.  Neck:  Neck supple, no JVD. No masses, thyromegaly or abnormal cervical nodes. Lungs:  Clear bilaterally to auscultation and percussion. Heart:  Non-displaced PMI, chest non-tender; regular rate and rhythm, S1, S2 without murmurs, rubs or gallops. Carotid upstroke normal, no bruit. Pedals normal pulses. No edema, no varicosities. Abdomen:  Bowel sounds positive; abdomen soft and non-tender without masses, organomegaly, or hernias noted. No hepatosplenomegaly. Extremities:  No clubbing or cyanosis. Neurologic:  Alert and oriented x 3. Psych:  Normal affect.   Impression & Recommendations:  Problem # 1:  CHRONIC DIASTOLIC HEART FAILURE (ICD-428.32) Moderate diastolic dysfunction on echo.  Improvement in symptoms with use of low-dose Lasix (20 mg daily).  Continue current dose of Lasix and potassium.  Etiology of diastolic dysfunction is likely diabetes and HTN.  Lexiscan myoview showed no evidence for ischemia.   Problem # 2:  HYPERTENSION  (ICD-401.9) BP is under reasonable control.  She asks about starting an ARB for renoprotection (has diabetes), which I think would be reasonable.  I will start her on losartan 25 mg daily and will check BMET in 2 wks.   Problem # 3:  TOBACCO USE (ICD-305.1) Still smoking.  I counselled her to quit.  She does not want a pharmocological aide.   Patient additionally needs to work on exercise and weight loss.   Other Orders: TLB-BMP (Basic Metabolic Panel-BMET) (80048-METABOL)  Patient Instructions: 1)  Your physician has recommended you make the following change in your medication:  2)  Start Losartan 25mg  daily 3)  Your physician recommends that you have lab today AND in 2 weeks---BMP today AND in 2 weeks---401.9 428.32 4)  Your physician wants you to follow-up in: 6 months with Dr Shirlee Latch.  You will receive a reminder letter in the mail two months in advance. If you don't receive a letter, please call our office to schedule the follow-up appointment. Prescriptions: LOSARTAN POTASSIUM 25 MG TABS (LOSARTAN POTASSIUM) one tablet daily  #  30 x 6   Entered by:   Katina Dung, RN, BSN   Authorized by:   Marca Ancona, MD   Signed by:   Katina Dung, RN, BSN on 02/26/2010   Method used:   Electronically to        [facility]* (retail)       [address] [place], [place]  [address]       Ph: [phone] or [phone]       Fax: [phone]   RxID:   [id number]

## 2010-12-22 NOTE — Assessment & Plan Note (Signed)
Summary: 3 ROV // RS   Vital Signs:  Patient profile:   65 year old female Menstrual status:  hysterectomy Weight:      216 pounds O2 Sat:      95 % on Room air Pulse rate:   92 / minute BP sitting:   100 / 60  (left arm) Cuff size:   large  Vitals Entered By: Romualdo Bolk, CMA (AAMA) (October 20, 2010 8:21 AM)  O2 Flow:  Room air CC: Follow-up visit on labs, Hypertension Management CBG Result 142   History of Present Illness: Joyce Richardson comes in today  for follow up of multiple medical problems but specifically diabetes.  Since her last visit she  seen Dr Richardean Chimera about her sleep and vertigo and now on lyrica with some help.  Labs were done there and for review.   DM ; is up to 30 units of Lantos and 30 units of her Humalog with her meals she still has some high blood sugars new low blood sugars she is currently unknown whether medicine for diabetes. She's not on metformin and it is unclear why she was taken off of it a while back although possibly from or when she was having abdominal pain.she denies active heart failure or kidney failure. COPD: still smoking tries to minimize it a difficult problem that she is drug alcohol free. No increasing shortness of breath does have chronic cough. HT No chagen MOOD: seems stable no change in medications  Hypertension History:      She denies headache, chest pain, palpitations, dyspnea with exertion, orthopnea, PND, peripheral edema, visual symptoms, neurologic problems, syncope, and side effects from treatment.  She notes no problems with any antihypertensive medication side effects.        Positive major cardiovascular risk factors include female age 36 years old or older, diabetes, hyperlipidemia, hypertension, and current tobacco user.        Positive history for target organ damage include cardiac end organ damage (either CHF or LVH).       Preventive Screening-Counseling & Management  Alcohol-Tobacco     Alcohol  drinks/day: 0     Smoking Status: current     Smoking Cessation Counseling: yes     Smoke Cessation Stage: precontemplative     Packs/Day: <0.25     Year Started: 1980     Tobacco Counseling: to quit use of tobacco products  Current Medications (verified): 1)  Venlafaxine Hcl 75 Mg Xr24h-Tab (Venlafaxine Hcl) .... 2 By Mouth Once Daily 2)  Seroquel 100 Mg Tabs (Quetiapine Fumarate) .... 2 By Mouth Once Daily 3)  Premarin 0.625 Mg  Tabs (Estrogens Conjugated) .Marland Kitchen.. 1 By Mouth Once Daily 4)  Lipitor 10 Mg  Tabs (Atorvastatin Calcium) .Marland Kitchen.. 1 By Mouth Once Daily 5)  Flonase 50 Mcg/act  Susp (Fluticasone Propionate) .... Use As Needed 6)  Accu-Chek Aviva  Strp (Glucose Blood) .... Use As Directed 7)  Humalog 100 Unit/ml Soln (Insulin Lispro (Human)) .... 30 Units A Day 8)  Lantus 100 Unit/ml Soln (Insulin Glargine) .... Take 30 Units A Day 9)  Multivitamins   Tabs (Multiple Vitamin) .... Once Daily 10)  Keppra 250 Mg Tabs (Levetiracetam) .... Two Tabs Two Times A Day 11)  Vitamin B-12 1000 Mcg Tabs (Cyanocobalamin) .... Once Daily 12)  Zofran 8 Mg Tabs (Ondansetron Hcl) .... As Needed 13)  Symbicort 160-4.5 Mcg/act Aero (Budesonide-Formoterol Fumarate) .... 2 Sprays Two Times A Day   or As Directed 14)  Furosemide  20 Mg Tabs (Furosemide) .... One Tablet Daily 15)  Potassium Chloride Cr 10 Meq Cr-Caps (Potassium Chloride) .... Take One Tablet By Mouth Daily 16)  Aspirin 81 Mg Tbec (Aspirin) .... Take One Tablet By Mouth Daily 17)  Losartan Potassium 25 Mg Tabs (Losartan Potassium) .... One Tablet Daily 18)  Proair Hfa 108 (90 Base) Mcg/act Aers (Albuterol Sulfate) .... Inhale By Mouth 1-2 Puffs Q 6 Hours As Needed For Wheezing. 19)  Bd Insulin Syringe Ultrafine 29g X 1/2" 0.3 Ml Misc (Insulin Syringe-Needle U-100) .... Use As Directed 20)  Bd Insulin Syringe Ultrafine 31g X 5/16" 0.3 Ml Misc (Insulin Syringe-Needle U-100) .... Use As Directed 21)  Lyrica 300 Mg Caps (Pregabalin) .... 2 By Mouth  At Bedtime  Allergies (verified): 1)  ! Penicillin 2)  * All Narcotics 3)  * Alchol Base Drugs 4)  Hyoscyamine  Past History:  Past medical, surgical, family and social histories (including risk factors) reviewed, and no changes noted (except as noted below).  Past Medical History: Reviewed history from 07/21/2010 and no changes required. 1. HYPERTENSION (ICD-401.9) 2. HYPERLIPIDEMIA (ICD-272.4) 3. 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 Hx of NARCOTIC ABUSE (ICD-305.90): [facility] summer 2010.  7. DIVERTICULOSIS-COLON (ICD-562.10) 8. IBS (ICD-564.1) 9. COPD (ICD-496): PFTs (3/11) FVC 80%, FEV1 74%, ratio 66%, TLC 90%, DLCO 54%. Mild obstruction, some response to bronchodilator.  10. TOBACCO USE (ICD-305.1) 11. ABUSE, ALCOHOL, IN REMISSION (ICD-305.03) 12. SEIZURE DISORDER (ICD-780.39) 13. GERD (ICD-530.81) 14. DIABETES MELLITUS, TYPE II (ICD-250.00)   15. DEPRESSION (ICD-311)per dr. Nolen Mu 16. ALLERGIC RHINITIS (ICD-477.9) 17. Hyponatremia with tegretol   18. Diastolic CHF: Echo (3/11) with EF 55-60%, moderate (grade II) diastolic dysfunction, mild LAE 19. Lexiscan myoview (3/11): likely normal with anterior attenuation.  EF 68%.  Low risk study.   Consults Dr. Leone Payor Dr. Nolen Mu  Past Surgical History: Reviewed history from 11/19/2008 and no changes required. Cholecystectomy Hysterectomy and r oopherectomy for fibroids. Appendectomy hemorrhoidectomy  Past History:  Care Management: Neurology: Dr. Sandria Manly- Dr. Shari Heritage (Sleep Specialist) Gastroenterology: Dr. Leone Payor Psychiatry: Dr. Maurice Small: [facility]wood Levo  ---- Cardiology:[facility]  Family History: Reviewed history from 01/21/2009 and no changes required. Family History of  father  2 suicides in family  Family History of Colon Cancer:Mothers sister  Family History of Diabetes: Both sides of family, parents, grandparents Family History of Heart Disease: Both sides of family,  parents, grandparents  Social History: Reviewed history from 09/21/2010 and no changes required. Married 3rd time    Current Smoker 1 ppd  per day since in 34s down now to 2 cigs/day attends AA Husband with MS on disability getting worse.   in [facility] a good bit  from [place] originally. Former Engineer, civil (consulting) Daily Caffeine Use-1/3 cup daily Illicit Drug Use - no   Patient does not get regular exercise.   Review of Systems       The patient complains of peripheral edema.  The patient denies anorexia, fever, weight loss, vision loss, decreased hearing, hoarseness, chest pain, syncope, dyspnea on exertion, headaches, abdominal pain, melena, transient blindness, difficulty walking, abnormal bleeding, enlarged lymph nodes, and angioedema.         does very little exercise to non-  Physical Exam  General:  alert, well-developed, well-nourished, and well-hydrated.  pleasent  in NAD  Head:  normocephalic and atraumatic.   Eyes:  vision grossly intact, pupils equal, and pupils round.   Neck:  No deformities, masses, or tenderness noted. Lungs:  normal respiratory effort  and no intercostal retractions.  rare rhonchi  no rales and good air movement Heart:  Normal rate and regular rhythm. S1 and S2 normal without gallop, murmur, click, rub or other extra sounds. Abdomen:  Bowel sounds positive,abdomen soft and non-tender without masses, organomegaly or hernias noted. Pulses:  pulses intact without delay   Extremities:  no clubbing cyanosis or edema  Neurologic:  alert & oriented X3, strength normal in all extremities, and gait normal.   Skin:  turgor normal, color normal, no ecchymoses, and no petechiae.   Cervical Nodes:  No lymphadenopathy noted Psych:  Oriented X3, memory intact for recent and remote, normally interactive, good eye contact, not anxious appearing, and not depressed appearing.   reviewed labs from Dr.  to [facility] 11.6 creatinine .67 potassium 4.2 calcium 8.5 hemoglobin 12.9  Impression &  Recommendations:  Problem # 1:  DIABETES MELLITUS, TYPE II (ICD-250.00) Assessment Deteriorated gets readings in 180 120  no lows  doesnt  understand about gaining .... weight and eating less and less control on the a1c.   techinique reviewed.disc  options  and says didnt have se of the metformin and ok to restart  ...  increase lantus and then  readdress humalog as is taking large amount of this and c/w inulin resistance.  may need another endocrine consult  . different provider Her updated medication list for this problem includes:    Humalog 100 Unit/ml Soln (Insulin lispro (human)) .Marland KitchenMarland KitchenMarland KitchenMarland Kitchen 30 units a day    Lantus 100 Unit/ml Soln (Insulin glargine) .Marland Kitchen... Take 40  units a day    Aspirin 81 Mg Tbec (Aspirin) .Marland Kitchen... Take one tablet by mouth daily    Losartan Potassium 25 Mg Tabs (Losartan potassium) ..... One tablet daily    Metformin Hcl 500 Mg Xr24h-tab (Metformin hcl) .Marland Kitchen... 1 by mouth once daily and then increase to  2 by mouth once daily after 3 weeks  or as directed  Orders: Capillary Blood Glucose/CBG ([address])  Labs Reviewed: Creat: 0.9 (02/26/2010)     Last Eye Exam: normal (01/20/2009) Reviewed HgBA1c results: 9.5 (10/13/2010)  8.3 (07/16/2010)  Problem # 2:  COPD (ICD-496) continue to smoke 1/2 pack or less and cough chronic .  no sob.   not ready to stop   is otherwise substance free  Her updated medication list for this problem includes:    Symbicort 160-4.5 Mcg/act Aero (Budesonide-formoterol fumarate) .Marland Kitchen... 2 sprays two times a day   or as directed    Proair Hfa 108 (90 Base) Mcg/act Aers (Albuterol sulfate) ..... Inhale by mouth 1-2 puffs q 6 hours as needed for wheezing.  Problem # 3:  HYPERTENSION (ICD-401.9)  Her updated medication list for this problem includes:    Furosemide 20 Mg Tabs (Furosemide) ..... One tablet daily    Losartan Potassium 25 Mg Tabs (Losartan potassium) ..... One tablet daily  BP today: 100/60 Prior BP: 132/84 (09/21/2010)  10 Yr Risk Heart  Disease: 8 % Prior 10 Yr Risk Heart Disease: 13 % (07/21/2010)  Labs Reviewed: K+: 4.1 (02/26/2010) Creat: : 0.9 (02/26/2010)   Chol: 180 (01/19/2010)   HDL: 64.00 (01/19/2010)   LDL: 94 (01/19/2010)   TG: 110.0 (01/19/2010)  Problem # 4:  HYPERLIPIDEMIA (ICD-272.4)  Her updated medication list for this problem includes:    Lipitor 10 Mg Tabs (Atorvastatin calcium) .Marland Kitchen... 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  poss from tobacco  although seemingly persistent .  Problem # 7:  TOBACCO USE (ICD-305.1) rec dc not ready  Complete Medication List: 1)  Venlafaxine Hcl 75 Mg Xr24h-tab (Venlafaxine hcl) .... 2 by mouth once daily 2)  Seroquel 100 Mg Tabs (Quetiapine fumarate) .... 2 by mouth once daily 3)  Premarin 0.625 Mg Tabs (Estrogens conjugated) .Marland Kitchen.. 1 by mouth once daily 4)  Lipitor 10 Mg Tabs (Atorvastatin calcium) .Marland Kitchen.. 1 by mouth once daily 5)  Flonase 50 Mcg/act Susp (Fluticasone propionate) .... Use as needed 6)  Accu-chek Aviva Strp (Glucose blood) .... Use as directed 7)  Humalog 100 Unit/ml Soln (Insulin lispro (human)) .... 30 units a day 8)  Lantus 100 Unit/ml Soln (Insulin glargine) .... Take 40  units a day 9)  Multivitamins Tabs (Multiple vitamin) .... Once daily 10)  Keppra 250 Mg Tabs (Levetiracetam) .... Two tabs two times a day 11)  Vitamin B-12 1000 Mcg Tabs (Cyanocobalamin) .... Once daily 12)  Zofran 8 Mg Tabs (Ondansetron  hcl) .... As needed 13)  Symbicort 160-4.5 Mcg/act Aero (Budesonide-formoterol fumarate) .... 2 sprays two times a day   or as directed 14)  Furosemide 20 Mg Tabs (Furosemide) .... One tablet daily 15)  Potassium Chloride Cr 10 Meq Cr-caps (Potassium chloride) .... Take one tablet by mouth daily 16)  Aspirin 81 Mg Tbec (Aspirin) .... Take one tablet by mouth daily 17)  Losartan Potassium 25 Mg Tabs (Losartan potassium) .... One tablet daily 18)  Proair Hfa 108 (90 Base) Mcg/act Aers (Albuterol sulfate) .... Inhale by mouth 1-2 puffs q 6 hours as needed for wheezing. 19)  Bd Insulin Syringe Ultrafine 29g X 1/2" 0.3 Ml Misc (Insulin syringe-needle u-100) .... Use as directed 20)  Bd Insulin Syringe Ultrafine 31g X 5/16" 0.3 Ml Misc (Insulin syringe-needle u-100) .... Use as directed 21)  Lyrica 300 Mg Caps (Pregabalin) .... 2 by mouth at bedtime 22)  Metformin Hcl 500 Mg Xr24h-tab (Metformin hcl) .Marland Kitchen.. 1 by mouth once daily and then increase to  2 by mouth once daily after 3 weeks  or as directed 23)  Zofran 4 Mg Tabs (Ondansetron hcl) .Marland Kitchen.. 1-2 by mouth three times a day as needed nausea  Hypertension Assessment/Plan:      The patient's hypertensive risk group is category C: Target organ damage and/or diabetes.  Her calculated 10 year risk of coronary heart disease is 8 %.  Today's blood pressure is 100/60.  Her blood pressure goal is < 130/80.  Patient Instructions: 1)  restart metformin  500xr per day and after   -3 weeks then can increase to 1000mg  per day.  2)  Increase basal insulin ( lantus to 40 U per day)    3)  return office visit in 6-8 weeks  or as needed.  Prescriptions: LANTUS 100 UNIT/ML SOLN (INSULIN GLARGINE) Take 40  units a day  #1 x 11   Entered by:   Romualdo Bolk, CMA (AAMA)   Authorized by:   Madelin Headings MD  Signed by:   Romualdo Bolk, CMA (AAMA) on 10/20/2010   Method used:   Electronically to        [facility]* (retail)       [address] [place], [place]  [address]       Ph: [phone] or [phone]       Fax: [phone]   RxID:   [id number] ZOFRAN 4 MG TABS (ONDANSETRON HCL) 1-2 by mouth three times a day as needed nausea  #20 x 1   Entered and Authorized by:   Madelin Headings MD   Signed by:   Madelin Headings MD on 10/20/2010   Method used:   Electronically to        [facility]* (retail)       [address] [place], [place]  [address]       Ph: [phone] or [phone]       Fax: [phone]   RxID:   [id number] METFORMIN HCL 500 MG XR24H-TAB (METFORMIN HCL) 1 by mouth once daily and then increase to  2 by mouth once daily after 3 weeks  or as directed  #60 x 3   Entered and Authorized by:   Madelin Headings MD   Signed by:   Madelin Headings MD on 10/20/2010   Method used:   Electronically to        [facility]* (retail)       [address] [place], [place]  [address]       Ph: [phone] or [phone]       Fax: [phone]   RxID:   [id number]    Orders Added: 1)  Capillary Blood Glucose/CBG [82948] 2)  Est. Patient Level IV [81829]

## 2010-12-22 NOTE — Miscellaneous (Signed)
Summary: Orders Update pft charges  Clinical Lists Changes  Orders: Added new Service order of Carbon Monoxide diffusing w/capacity (94720) - Signed Added new Service order of Lung Volumes (94240) - Signed Added new Service order of Spirometry (Pre & Post) (94060) - Signed 

## 2010-12-22 NOTE — Progress Notes (Signed)
Summary: REFILLS  Phone Note Refill Request Message from:  Fax from Pharmacy  Refills Requested: Medication #1:  PREMARIN 0.625 MG  TABS 1 by mouth once daily  Medication #2:  HUMALOG KWIKPEN 100 UNIT/ML SOLN 4-8 u if bcg > 200 three times a day  Medication #3:  LIPITOR 10 MG  TABS 1 by mouth once daily [facility] [phone]      FAX---[phone]   Initial call taken by: Warnell Forester,  January 09, 2010 10:51 AM  Follow-up for Phone Call        faxed electronically to pharmacy. Follow-up by: Romualdo Bolk, CMA (AAMA),  January 09, 2010 11:24 AM    Prescriptions: HUMALOG KWIKPEN 100 UNIT/ML SOLN (INSULIN LISPRO (HUMAN)) 4-8 u if bcg > 200 three times a day  #30 days x 1   Entered by:   Romualdo Bolk, CMA (AAMA)   Authorized by:   Madelin Headings MD   Signed by:   Romualdo Bolk, CMA (AAMA) on 01/09/2010   Method used:   Faxed to ...       [facility]* (retail)       [address] [place], [place]  [address]       Ph: [phone] or [phone]       Fax: [phone]   RxID:   [id number] LIPITOR 10 MG  TABS (ATORVASTATIN CALCIUM) 1 by mouth once daily  #30 x 1   Entered by:   Romualdo Bolk, CMA (AAMA)   Authorized by:   Madelin Headings MD   Signed by:   Romualdo Bolk, CMA (AAMA) on 01/09/2010   Method used:   Faxed to ...       [facility]* (retail)       [address] [place], [place]  [address]       Ph: [phone] or [phone]       Fax: [phone]   RxID:   [id number] PREMARIN 0.625 MG  TABS (ESTROGENS CONJUGATED) 1 by mouth once daily  #30 x 1   Entered by:   Romualdo Bolk, CMA (AAMA)   Authorized by:   Madelin Headings MD   Signed by:   Romualdo Bolk, CMA (AAMA) on 01/09/2010   Method used:   Faxed to ...       [facility]* (retail)       [address] [place], [place]  [address]       Ph: [phone] or [phone]       Fax: [phone]   RxID:   [id number]

## 2010-12-22 NOTE — Progress Notes (Signed)
Summary: refill  Phone Note From Pharmacy   Caller: [facility]* Reason for Call: Needs renewal Details for Reason: premarin 0.625mg  Initial call taken by: Romualdo Bolk, CMA Duncan Dull),  May 15, 2010 3:50 PM  Follow-up for Phone Call        rx sent to the pharmacy. Follow-up by: Romualdo Bolk, CMA Duncan Dull),  May 15, 2010 3:51 PM    Prescriptions: PREMARIN 0.625 MG  TABS (ESTROGENS CONJUGATED) 1 by mouth once daily  #30 x 1   Entered by:   Romualdo Bolk, CMA (AAMA)   Authorized by:   Madelin Headings MD   Signed by:   Romualdo Bolk, CMA (AAMA) on 05/15/2010   Method used:   Electronically to        [facility]* (retail)       [address] [place], [place]  [address]       Ph: [phone] or [phone]       Fax: [phone]   RxID:   [id number]

## 2010-12-22 NOTE — Assessment & Plan Note (Signed)
Summary: DIABETES F/U // RS   Vital Signs:  Patient profile:   65 year old female Menstrual status:  hysterectomy Height:      60.5 inches Weight:      141 pounds O2 Sat:      96 % on Room air Temp:     98.2 degrees F oral Pulse rate:   100 / minute BP sitting:   130 / 80  (right arm) Cuff size:   regular  Vitals Entered By: Romualdo Bolk, CMA (AAMA) (December 22, 2009 8:24 AM)  O2 Flow:  Room air CC: Follow-up visit on DM but also has coughing, congestion, fever 102.0 and wheezing that has been going for 1 week., Hypertension Management   History of Present Illness: Joyce Richardson comes in today for   for above.  Since her last appt in over 4 months ago  she has not been in for routine care     ...she went through tthe program [facility] for [facility] days and then Op for narcotic dependence addition.   Sh comesin with a written review of her meds and  status of her specialist docs .  Today she is concerned about a flu like illness this week  with fever and cough ( had  flu shot)    sinus drainage and gi se .   5 days of fever. s no fever plus m1nus sob.    some blood streaks  with phlegm.  DM  :   not going to see  Dr.   Everardo All   .   personal reasons with her husband and also she is not eligable for pump at present.  No one has checked her DM since him.   LIPIds: no se of meds back on since september    Lipitor  Seizure : no changes  no episodes   following on keprra.  Tobacco 1ppd  .   GI : is off a lot of meds Gi   on no carcotics    takes as needed  zofran for nausea  lomotil if needed  HRT  still on  premarin  no signs      Hypertension History:      She complains of headache, dyspnea with exertion, orthopnea, and syncope, but denies chest pain, palpitations, PND, peripheral edema, visual symptoms, neurologic problems, and side effects from treatment.  She notes no problems with any antihypertensive medication side effects.  sinus ha's, sob, dizziness all with  illness.        Positive major cardiovascular risk factors include female age 18 years old or older, diabetes, hyperlipidemia, hypertension, and current tobacco user.      Preventive Screening-Counseling & Management  Alcohol-Tobacco     Alcohol drinks/day: 0     Smoking Status: current     Smoking Cessation Counseling: yes     Smoke Cessation Stage: precontemplative     Packs/Day: 1.0  Caffeine-Diet-Exercise     Caffeine use/day: 1     Does Patient Exercise: yes     Type of exercise: walk     Times/week: 7  Current Medications (verified): 1)  Venlafaxine Hcl 75 Mg Xr24h-Tab (Venlafaxine Hcl) .... 2 By Mouth Once Daily 2)  Seroquel 100 Mg Tabs (Quetiapine Fumarate) .... 2 By Mouth Once Daily 3)  Premarin 0.625 Mg  Tabs (Estrogens Conjugated) .Marland Kitchen.. 1 By Mouth Once Daily 4)  Lipitor 10 Mg  Tabs (Atorvastatin Calcium) .Marland Kitchen.. 1 By Mouth Once Daily 5)  Flonase  50 Mcg/act  Susp (Fluticasone Propionate) .... Use As Needed 6)  Accu-Chek Aviva  Strp (Glucose Blood) .... Use As Directed 7)  Humalog Kwikpen 100 Unit/ml Soln (Insulin Lispro (Human)) .... 4-8 U If Bcg > 200 Three Times A Day 8)  Promethazine Hcl 25 Mg  Supp (Promethazine Hcl) .Marland Kitchen.. 1 Every 8 Hrs As Needed 9)  Pen Needles 31g X 8 Mm Misc (Insulin Pen Needle) .... Use As Directed 10)  Lantus 100 Unit/ml Soln (Insulin Glargine) .... Take 14 Units Daily 11)  Multivitamins   Tabs (Multiple Vitamin) 12)  Keppra 250 Mg Tabs (Levetiracetam) .... Two Tabs Two Times A Day 13)  Flexeril 10 Mg Tabs (Cyclobenzaprine Hcl) .... 1/2  By Mouth Three Times A Day As Needed 14)  Vitamin B-12 1000 Mcg Tabs (Cyanocobalamin) 15)  Melatonin 3 Mg Tabs (Melatonin) 16)  Zofran 8 Mg Tabs (Ondansetron Hcl) .... As Needed 17)  Lomotil 2.5-0.025 Mg Tabs (Diphenoxylate-Atropine)  Allergies (verified): 1)  ! Penicillin 2)  * All Narcotics 3)  * Alchol Base Drugs 4)  Hyoscyamine  Past History:  Past medical, surgical, family and social histories  (including risk factors) reviewed, and no changes noted (except as noted below).  Past Medical History: Allergic rhinitis Depression per dr. Nolen Mu Diabetes mellitus, type II  on ARB for proteinuria Diverticulitis, hx of   GERD Hyperlipidemia Seizure disorder  on Lamictal Hyponatremia with tegretol   recovering alcoholic      [facility]  summer fall 2010 for narcotic use IBS  Consults Dr. Leone Payor Dr. Nolen Mu  Past Surgical History: Reviewed history from 11/19/2008 and no changes required. Cholecystectomy Hysterectomy and r oopherectomy for fibroids. Appendectomy hemorrhoidectomy  Past History:  Care Management: Neurology: Dr. Sandria Manly Gastroenterology: Dr. Leone Payor Psychiatry: Dr. Maurice Small: Norwood Levo  ----  Family History: Reviewed history from 01/21/2009 and no changes required. Family History of  father  2 suicides in family  Family History of Colon Cancer:Mothers sister  Family History of Diabetes: Both sides of family, parents, grandparents Family History of Heart Disease: Both sides of family, parents, grandparents  Social History: Reviewed history from 02/27/2009 and no changes required. Married 3rd time    Current Smoker 1 ppd  per day since in 59s attends AA Husband with ms on disability   getting worse. from [place] [employer] originally. Recently   on disability  Daily Caffeine Use-1/3 cup daily Illicit Drug Use - no   Patient does not get regular exercise.   Review of Systems  The patient denies anorexia, weight loss, weight gain, chest pain, syncope, peripheral edema, hemoptysis, melena, hematochezia, suspicious skin lesions, transient blindness, difficulty walking, unusual weight change, abnormal bleeding, enlarged lymph nodes, and angioedema.         ? fever  still has abd pain    coping   Physical Exam  General:  Well-developed,well-nourished,in no acute distress; alert,appropriate and cooperative throughout examination Head:  Normocephalic and  atraumatic without obvious abnormalities. No apparent alopecia or balding. Eyes:  vision grossly intact.   Ears:  R ear normal, L ear normal, and no external deformities.   Nose:  no external deformity and no external erythema.  tender right maxillary area  Mouth:  pharynx pink and moist.   Neck:  No deformities, masses, or tenderness noted. Lungs:  normal respiratory effort, no intercostal retractions, no accessory muscle use, no dullness, and no crackles.  end expiratory wheezes    good air movement  Heart:  normal rate, regular rhythm, no murmur, no  gallop, and no lifts.   Abdomen:  soft, non-tender, normal bowel sounds, no distention, no hepatomegaly, and no splenomegaly.  protuberant  Pulses:  pulses intact without delay   Extremities:  no clubbing cyanosis or edema  Neurologic:  alert & oriented X3, sensation intact to light touch, and gait normal.   Skin:  turgor normal, color normal, no ecchymoses, and no petechiae.  single  keratotic lesions on ankles  Cervical Nodes:  No lymphadenopathy noted Psych:  Oriented X3, normally interactive, good eye contact, not anxious appearing, and not depressed appearing.    Diabetes Management Exam:    Foot Exam (with socks and/or shoes not present):       Sensory-Monofilament:          Left foot: normal          Right foot: normal       Inspection:          Left foot: normal          Right foot: normal       Nails:          Left foot: thickened          Right foot: thickened   Impression & Recommendations:  Problem # 1:  ACUTE MAXILLARY SINUSITIS, RIGHT (ICD-461.0)  The following medications were removed from the medication list:    Doxycycline Hyclate 100 Mg Tabs (Doxycycline hyclate) ..... One by mouth two times a day for 7 days Her updated medication list for this problem includes:    Flonase 50 Mcg/act Susp (Fluticasone propionate) ..... Use as needed    Azithromycin 250 Mg Tabs (Azithromycin) .Marland Kitchen... Take 2 by mouth day 1 then 1 by  mouth once daily for 4 more days.  Orders: Prescription Created Electronically [phone])  Problem # 2:  DIABETES MELLITUS, TYPE II (ICD-250.00) no follow up   since  summer.  The following medications were removed from the medication list:    Lisinopril 20 Mg Tabs (Lisinopril) .Marland Kitchen... 1 by mouth once daily Her updated medication list for this problem includes:    Humalog Kwikpen 100 Unit/ml Soln (Insulin lispro (human)) .Marland KitchenMarland KitchenMarland KitchenMarland Kitchen 4-8 u if bcg > 200 three times a day    Lantus 100 Unit/ml Soln (Insulin glargine) .Marland Kitchen... Take 14 units daily  Problem # 3:  HYPERLIPIDEMIA (ICD-272.4) since september  Her updated medication list for this problem includes:    Lipitor 10 Mg Tabs (Atorvastatin calcium) .Marland Kitchen... 1 by mouth once daily  Problem # 4:  BRONCHITIS, ACUTE WITH MILD BRONCHOSPASM (ICD-466.0)  concern about hx of fever but looks good today and may be resolved   The following medications were removed from the medication list:    Doxycycline Hyclate 100 Mg Tabs (Doxycycline hyclate) ..... One by mouth two times a day for 7 days Her updated medication list for this problem includes:    Azithromycin 250 Mg Tabs (Azithromycin) .Marland Kitchen... Take 2 by mouth day 1 then 1 by mouth once daily for 4 more days.  Orders: Prescription Created Electronically [phone])  Problem # 5:  DRUG ABUSE, HX OF (ICD-V15.89)   narcotic dependence  clear for 6 months.  by report  [facility]  Complete Medication List: 1)  Venlafaxine Hcl 75 Mg Xr24h-tab (Venlafaxine hcl) .... 2 by mouth once daily 2)  Seroquel 100 Mg Tabs (Quetiapine fumarate) .... 2 by mouth once daily 3)  Premarin 0.625 Mg Tabs (Estrogens conjugated) .Marland Kitchen.. 1 by mouth once daily 4)  Lipitor 10 Mg Tabs (Atorvastatin  calcium) .Marland Kitchen.. 1 by mouth once daily 5)  Flonase 50 Mcg/act Susp (Fluticasone propionate) .... Use as needed 6)  Accu-chek Aviva Strp (Glucose blood) .... Use as directed 7)  Humalog Kwikpen 100 Unit/ml Soln (Insulin lispro (human)) .... 4-8 u  if bcg > 200 three times a day 8)  Promethazine Hcl 25 Mg Supp (Promethazine hcl) .Marland Kitchen.. 1 every 8 hrs as needed 9)  Pen Needles 31g X 8 Mm Misc (Insulin pen needle) .... Use as directed 10)  Lantus 100 Unit/ml Soln (Insulin glargine) .... Take 14 units daily 11)  Multivitamins Tabs (Multiple vitamin) 12)  Keppra 250 Mg Tabs (Levetiracetam) .... Two tabs two times a day 13)  Vitamin B-12 1000 Mcg Tabs (Cyanocobalamin) 14)  Melatonin 3 Mg Tabs (Melatonin) 15)  Zofran 8 Mg Tabs (Ondansetron hcl) .... As needed 16)  Lomotil 2.5-0.025 Mg Tabs (Diphenoxylate-atropine) 17)  Azithromycin 250 Mg Tabs (Azithromycin) .... Take 2 by mouth day 1 then 1 by mouth once daily for 4 more days. 18)  Ondansetron Hcl 8 Mg Tabs (Ondansetron hcl) .Marland Kitchen.. 1 by mouth two times a day for nausea  Hypertension Assessment/Plan:      The patient's hypertensive risk group is category C: Target organ damage and/or diabetes.  Her calculated 10 year risk of coronary heart disease is 24 %.  Today's blood pressure is 130/80.  Her blood pressure goal is < 130/80.  Patient Instructions: 1)  take antibioitc   2)  call if persistent fever and we may need to get a chest x ray. 3)  fasting labs      250, 272.4  4)  BMP prior to visit, ICD-9:  5)  Hepatic Panel prior to visit ICD-9:  6)  Lipid panel prior to visit ICD-9 :  7)  TSH prior to visit ICD-9 :  8)  CBC w/ Diff prior to visit ICD-9 :  9)  HgBA1c prior to visit  ICD-9:  10)  Urine Microalbumin prior to visit ICD-9 :  11)  then plan follow up after lab results are back. Prescriptions: ONDANSETRON HCL 8 MG TABS (ONDANSETRON HCL) 1 by mouth two times a day for nausea  #30 x 0   Entered and Authorized by:   Madelin Headings MD   Signed by:   Madelin Headings MD on 12/22/2009   Method used:   Electronically to        [facility]* (retail)       [address] [place], [place]  [address]       Ph: [phone] or  [phone]       Fax: [phone]   RxID:   [id number] AZITHROMYCIN 250 MG TABS (AZITHROMYCIN) take 2 by mouth day 1 then 1 by mouth once daily for 4 more days.  #6 x 0   Entered and Authorized by:   Madelin Headings MD   Signed by:   Madelin Headings MD on 12/22/2009   Method used:   Electronically to        [facility]* (retail)       [address] [place], [place]  [address]       Ph: [phone] or [phone]       Fax: [phone]   RxID:  [id number]  

## 2010-12-22 NOTE — Progress Notes (Signed)
Summary: doing better.  Phone Note Outgoing Call   Call placed by: Romualdo Bolk, CMA Duncan Dull),  January 01, 2010 11:24 AM Call placed to: Husband Summary of Call: Pt's BS are back to normal and pt is doing better. BS are back to 140-170. Pt has d/c predisone. Pt is using the albuterol 2-3 times a day but it is getting better. She doesn't needed to be seen. Initial call taken by: Romualdo Bolk, CMA (AAMA),  January 01, 2010 11:25 AM

## 2010-12-22 NOTE — Letter (Signed)
Summary: [facility]  [facility]   Imported By: Maryln Gottron 10/07/2010 12:29:35  _____________________________________________________________________  External Attachment:    Type:   Image     Comment:   External Document

## 2010-12-22 NOTE — Progress Notes (Signed)
Summary: Pt wheezing,diff breathing laying down,finished antibiotic  Phone Note Call from Patient Call back at Home Phone [phone]   Caller: Patient Summary of Call: Pt called and said that she is wheezing really bad and can not breathe well when she is laying done. Pt finished antibiotic today. Please call asap.  Initial call taken by: Lucy Antigua,  December 26, 2009 10:44 AM  Follow-up for Phone Call        Pt continues wheezing with temp of 99.  Concerned about wheezing and SOB when laying down.  Very fatigued. appt scheduled for today. Follow-up by: Lynann Beaver CMA,  December 26, 2009 11:13 AM  Additional Follow-up for Phone Call Additional follow up Details #1::        PER DR. Sandralee Tarkington- HAVE PT GO TO [facility] FOR CXR PRIOR TO APPT. Additional Follow-up by: Romualdo Bolk, CMA (AAMA),  December 26, 2009 11:17 AM    Additional Follow-up for Phone Call Additional follow up Details #2::    Pt will go for CXR. Follow-up by: Lynann Beaver CMA,  December 26, 2009 11:35 AM

## 2010-12-24 NOTE — Progress Notes (Signed)
Summary: Blood sugar dropping  Phone Note Call from Patient Call back at Home Phone [phone]   Caller: husband-Ron Summary of Call: He states that her blood sugars are running low now that she is on metformin. Pt's blood sugar is now 111 3:20pm. It was 44 around 12:30-1. He states that they can't keep it. Pt is feeling better than she was when it was 44. Pt is not having symptoms. She is having numbness in face, hands and feet. When it was low her heart was pounding. Pt just took blood sugar and it is 60. She is taking 40 u humalog around 1pm. She is taking 20 lantus at supper time last night.  Pt ate hamburger and 2 slices of bread. 12 oz juice.   Per Dr. Fabian Sharp- 40 units of lantus a day. No more humalog today. Call us before 5pm about what her sugar is doing.  Pt's husband is wanting to know about the metformin. He is going to call back at 4:45pm today. Initial call taken by: Romualdo Bolk, CMA Duncan Dull),  October 26, 2010 3:32 PM  Follow-up for Phone Call        Spoke to pt's husband and blood sugar was 71 at 4:38pm at 4:30pm it was 55 drink lots of OJ and candy.  Follow-up by: Romualdo Bolk, CMA Duncan Dull),  October 26, 2010 4:58 PM  Additional Follow-up for Phone Call Additional follow up Details #1::        disc with patient around 5 pm  feeling better but sugar still around 70-80.    Pat felt that increasing the metformin to 1500 mg did this but she also had inc her humalog to 40 u!    We had coimmunicated to increase the lantus not the humalog.    Also  different  deeper needle  that may deliver med better .   also could cause insuliiin reaction.      advised no humalog today  Lanrus 20 tonight .   contact in am about further  advice.  patient agrees.  ( she has glucose tabs at home .   No glucagon.) Additional Follow-up by: Madelin Headings MD,  October 26, 2010 6:05 PM

## 2010-12-24 NOTE — Miscellaneous (Signed)
Summary: Orders Update  Clinical Lists Changes  Orders: Added new Test order of Venous Duplex Lower Extremity (Venous Duplex Lower) - Signed 

## 2010-12-24 NOTE — Miscellaneous (Signed)
Summary: labs done by Dr. Vickey Huger  Clinical Lists Changes  Observations: Added new observation of CREATININE: 0.67 mg/dL (16/08/9603 54:09) Added new observation of BUN: 10 mg/dL (81/19/1478 29:56) Added new observation of K SERUM: 4.2 meq/L (10/12/2010 16:45) Added new observation of GLU MON POC: 227 mg/dL (21/30/8657 84:69) Added new observation of HGB: 12.9 g/dL (62/95/2841 32:44) Added new observation of WBC COUNT: 11.6 10*3/microliter (10/12/2010 16:45)

## 2010-12-24 NOTE — Assessment & Plan Note (Signed)
Summary: 6 wk rov/njr   Vital Signs:  Patient profile:   65 year old female Menstrual status:  hysterectomy Weight:      226 pounds O2 Sat:      97 % on Room air Pulse rate:   80 / minute BP sitting:   110 / 60  (right arm) Cuff size:   large  Vitals Entered By: Romualdo Bolk, CMA (AAMA) (December 15, 2010 8:39 AM)  O2 Flow:  Room air CC: Follow-up visit on blood sugars Is Patient Diabetic? Yes Did you bring your meter with you today? No CBG Result 191   History of Present Illness: Joyce Richardson .comes in today  for  diabetes check . Since last visit she has been working with her diabetes out of control and initially had hypoglycemia taking higher doses of humalog than rec ( ? if communication error or  decision to do this different)     NSee OPHone notes.  neverthe less taking 30 three times a day with some adjsutement for her meals and 40 lantus once a day  she is on 1000 metformin without se . Husband has many ?s about    how these meds interact.      She is concerned about weight gain. Her readings   show elevated some fastings and ocass post breakfast of lunch lows in the 80s .      Also lef tleg swollen for a while without trauma  and stiff and concerned  no ankle pain but ankle is swollen.   COPD": no flares or changes    still smoking NEUR:  no seizures and no change in meds except increase in her lyrica per dr love. PSYCH: stable on meds no change   no substance use.    Preventive Screening-Counseling & Management  Alcohol-Tobacco     Alcohol drinks/day: 0     Smoking Status: current     Smoking Cessation Counseling: yes     Smoke Cessation Stage: precontemplative     Packs/Day: 1.0     Year Started: 1980     Tobacco Counseling: to quit use of tobacco products  Caffeine-Diet-Exercise     Caffeine use/day: 1     Does Patient Exercise: yes     Type of exercise: walk     Exercise (avg: min/session): 2:54     Times/week: 7  Current Medications  (verified): 1)  Venlafaxine Hcl 75 Mg Xr24h-Tab (Venlafaxine Hcl) .... 2 By Mouth Once Daily 2)  Seroquel 100 Mg Tabs (Quetiapine Fumarate) .... 2 By Mouth Once Daily 3)  Premarin 0.625 Mg  Tabs (Estrogens Conjugated) .Marland Kitchen.. 1 By Mouth Once Daily 4)  Lipitor 10 Mg  Tabs (Atorvastatin Calcium) .Marland Kitchen.. 1 By Mouth Once Daily 5)  Flonase 50 Mcg/act  Susp (Fluticasone Propionate) .... Use As Needed 6)  Accu-Chek Aviva  Strp (Glucose Blood) .... Use As Directed 7)  Humalog 100 Unit/ml Soln (Insulin Lispro (Human)) .... 30 Units A Day Three Times A Day 8)  Lantus 100 Unit/ml Soln (Insulin Glargine) .... Take 40  Units A Day 9)  Multivitamins   Tabs (Multiple Vitamin) .... Once Daily 10)  Keppra 250 Mg Tabs (Levetiracetam) .... Two Tabs Two Times A Day 11)  Vitamin B-12 1000 Mcg Tabs (Cyanocobalamin) .... Once Daily 12)  Symbicort 160-4.5 Mcg/act Aero (Budesonide-Formoterol Fumarate) .... 2 Sprays Two Times A Day   or As Directed 13)  Furosemide 20 Mg Tabs (Furosemide) .... One Tablet Daily 14)  Potassium Chloride  Cr 10 Meq Cr-Caps (Potassium Chloride) .... Take One Tablet By Mouth Daily 15)  Aspirin 81 Mg Tbec (Aspirin) .... Take One Tablet By Mouth Daily 16)  Losartan Potassium 25 Mg Tabs (Losartan Potassium) .... One Tablet Daily 17)  Proair Hfa 108 (90 Base) Mcg/act Aers (Albuterol Sulfate) .... Inhale By Mouth 1-2 Puffs Q 6 Hours As Needed For Wheezing. 18)  Bd Insulin Syringe Ultrafine 31g X 5/16" 0.3 Ml Misc (Insulin Syringe-Needle U-100) .... Use As Directed 19)  Lyrica 300 Mg Caps (Pregabalin) .... 2 By Mouth At Bedtime 20)  Metformin Hcl 500 Mg Xr24h-Tab (Metformin Hcl) .... 2 By Mouth Once Daily or As Directed 21)  Zofran 4 Mg Tabs (Ondansetron Hcl) .Marland Kitchen.. 1-2 By Mouth Three Times A Day As Needed Nausea  Allergies (verified): 1)  ! Penicillin 2)  * All Narcotics 3)  * Alchol Base Drugs 4)  Hyoscyamine  Past History:  Past medical, surgical, family and social histories (including risk  factors) reviewed for relevance to current acute and chronic problems.  Past Medical History: Reviewed history from 07/21/2010 and no changes required. 1. HYPERTENSION (ICD-401.9) 2. HYPERLIPIDEMIA (ICD-272.4) 3. 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 Hx of NARCOTIC ABUSE (ICD-305.90): [facility] summer 2010.  7. DIVERTICULOSIS-COLON (ICD-562.10) 8. IBS (ICD-564.1) 9. COPD (ICD-496): PFTs (3/11) FVC 80%, FEV1 74%, ratio 66%, TLC 90%, DLCO 54%. Mild obstruction, some response to bronchodilator.  10. TOBACCO USE (ICD-305.1) 11. ABUSE, ALCOHOL, IN REMISSION (ICD-305.03) 12. SEIZURE DISORDER (ICD-780.39) 13. GERD (ICD-530.81) 14. DIABETES MELLITUS, TYPE II (ICD-250.00)   15. DEPRESSION (ICD-311)per dr. Nolen Mu 16. ALLERGIC RHINITIS (ICD-477.9) 17. Hyponatremia with tegretol   18. Diastolic CHF: Echo (3/11) with EF 55-60%, moderate (grade II) diastolic dysfunction, mild LAE 19. Lexiscan myoview (3/11): likely normal with anterior attenuation.  EF 68%.  Low risk study.   Consults Dr. Leone Payor Dr. Nolen Mu  Past Surgical History: Reviewed history from 11/19/2008 and no changes required. Cholecystectomy Hysterectomy and r oopherectomy for fibroids. Appendectomy hemorrhoidectomy  Past History:  Care Management: Neurology: Dr. Sandria Manly- Dr. Shari Heritage (Sleep Specialist) Gastroenterology: Dr. Leone Payor Psychiatry: Dr. Maurice Small: [place]wood Levo  ---- Cardiology:[facility]  Family History: Reviewed history from 01/21/2009 and no changes required. Family History of  father  2 suicides in family  Family History of Colon Cancer:Mothers sister  Family History of Diabetes: Both sides of family, parents, grandparents Family History of Heart Disease: Both sides of family, parents, grandparents  Social History: Reviewed history from 09/21/2010 and no changes required. Married 3rd time    Current Smoker 1 ppd  per day since in 32s down now to 2 cigs/day attends  [employer] Husband with MS on disability getting worse.   in [facility] a good bit  from [place] originally. Former Engineer, civil (consulting) Daily Caffeine Use-1/3 cup daily Illicit Drug Use - no   Patient does not get regular exercise.  Packs/Day:  1.0  Review of Systems       The patient complains of weight gain and peripheral edema.  The patient denies anorexia, fever, weight loss, vision loss, chest pain, syncope, dyspnea on exertion, prolonged cough, headaches, hemoptysis, abdominal pain, melena, hematochezia, severe indigestion/heartburn, hematuria, muscle weakness, transient blindness, difficulty walking, unusual weight change, abnormal bleeding, enlarged lymph nodes, and angioedema.    Physical Exam  General:  Well-developed,well-nourished,in no acute distress; alert,appropriate and cooperative throughout examination tired  alert well   here with husband  Head:  normocephalic and atraumatic.   Eyes:  vision grossly intact.   Nose:  no external deformity and no nasal discharge.   Neck:  No deformities, masses, or tenderness noted. Lungs:  normal respiratory effort, no intercostal retractions, no accessory muscle use, no dullness, no crackles, and no wheezes.   Heart:  normal rate, regular rhythm, no murmur, no gallop, no JVD, and no lifts.   Abdomen:  large no fluid wave  soft, no hepatomegaly, and no splenomegaly.   Msk:  no joint warmth and no redness over joints.  minimal knee swelling  right ankel with edema and good rom  non tender Pulses:  pulses intact without delay  no bruits heard Extremities:  left LE 1-2+ edema withlout warmth slightly pink no fiocal tenderenss no ulcers  Neurologic:  alert & oriented X3 and gait normal.   Skin:  turgor normal and color normal.  see leg exam  Cervical Nodes:  No lymphadenopathy noted Psych:  Oriented X3, memory intact for recent and remote, normally interactive, good eye contact, and not anxious appearing.     Impression & Recommendations:  Problem # 1:  DIABETES  MELLITUS, TYPE II (ICD-250.00)  still having some ocass hypoglycemia  taking too much  humalog for  a given eating and this can add to her weight gain .Marland Kitchen  Disc use of metformin may or may not work for her but seem to have helped her insulin sensitivity as well as chane in needl length.  I think she should get back with endocrinology  and some diabetic educators to review.  She is very amenable and motivated  to doing the right thig and monitoring her readings.  and  will refer to  someone in her network.  Her updated medication list for this problem includes:    Humalog 100 Unit/ml Soln (Insulin lispro (human)) .Marland KitchenMarland KitchenMarland KitchenMarland Kitchen 30 units a day three times a day    Lantus 100 Unit/ml Soln (Insulin glargine) .Marland Kitchen... Take 40  units a day    Aspirin 81 Mg Tbec (Aspirin) .Marland Kitchen... Take one tablet by mouth daily    Losartan Potassium 25 Mg Tabs (Losartan potassium) ..... One tablet daily    Metformin Hcl 500 Mg Xr24h-tab (Metformin hcl) .Marland Kitchen... 2 by mouth once daily increase to 3 by mouth once daily h  Orders: Capillary Blood Glucose/CBG (16109) Endocrinology Referral (Endocrine)  Labs Reviewed: Creat: 0.67 (10/12/2010)     Last Eye Exam: normal (01/20/2009) Reviewed HgBA1c results: 9.5 (10/13/2010)  8.3 (07/16/2010)  Problem # 2:  SWELLING OF LIMB (ICD-729.81) left leg more swollen than left and seems around ankle  no redness or warmth but will check doppler  apulses are normal and no other lesions  Orders: Radiology Referral (Radiology)  Problem # 3:  COPD (ICD-496) Assessment: Unchanged  still smoking  stable   Her updated medication list for this problem includes:    Symbicort 160-4.5 Mcg/act Aero (Budesonide-formoterol fumarate) .Marland Kitchen... 2 sprays two times a day   or as directed    Proair Hfa 108 (90 Base) Mcg/act Aers (Albuterol sulfate) ..... Inhale by mouth 1-2 puffs q 6 hours as needed for wheezing.  Pulmonary Functions Reviewed: O2 sat: 97 (12/15/2010)     Vaccines Reviewed: Pneumovax: Pneumovax  (03/24/2009)   Flu Vax: Fluvax 3+ (07/21/2010)  Problem # 4:  WEIGHT GAIN (ICD-783.1) many causes  lyrica increased but could be  the insulin and use  she is frustrated with this  and review strategies   Problem # 5:  HYPERTENSION (ICD-401.9)  Her updated medication list for this problem includes:  Furosemide 20 Mg Tabs (Furosemide) ..... One tablet daily    Losartan Potassium 25 Mg Tabs (Losartan potassium) ..... One tablet daily  BP today: 110/60 Prior BP: 100/60 (10/20/2010)  Prior 10 Yr Risk Heart Disease: 8 % (10/20/2010)  Labs Reviewed: K+: 4.2 (10/12/2010) Creat: : 0.67 (10/12/2010)   Chol: 180 (01/19/2010)   HDL: 64.00 (01/19/2010)   LDL: 94 (01/19/2010)   TG: 110.0 (01/19/2010)  Complete Medication List: 1)  Venlafaxine Hcl 75 Mg Xr24h-tab (Venlafaxine hcl) .... 2 by mouth once daily 2)  Seroquel 100 Mg Tabs (Quetiapine fumarate) .... 2 by mouth once daily 3)  Premarin 0.625 Mg Tabs (Estrogens conjugated) .Marland Kitchen.. 1 by mouth once daily 4)  Lipitor 10 Mg Tabs (Atorvastatin calcium) .Marland Kitchen.. 1 by mouth once daily 5)  Flonase 50 Mcg/act Susp (Fluticasone propionate) .... Use as needed 6)  Accu-chek Aviva Strp (Glucose blood) .... Use as directed 7)  Humalog 100 Unit/ml Soln (Insulin lispro (human)) .... 30 units a day three times a day 8)  Lantus 100 Unit/ml Soln (Insulin glargine) .... Take 40  units a day 9)  Multivitamins Tabs (Multiple vitamin) .... Once daily 10)  Keppra 250 Mg Tabs (Levetiracetam) .... Two tabs two times a day 11)  Vitamin B-12 1000 Mcg Tabs (Cyanocobalamin) .... Once daily 12)  Symbicort 160-4.5 Mcg/act Aero (Budesonide-formoterol fumarate) .... 2 sprays two times a day   or as directed 13)  Furosemide 20 Mg Tabs (Furosemide) .... One tablet daily 14)  Potassium Chloride Cr 10 Meq Cr-caps (Potassium chloride) .... Take one tablet by mouth daily 15)  Aspirin 81 Mg Tbec (Aspirin) .... Take one tablet by mouth daily 16)  Losartan Potassium 25 Mg Tabs (Losartan  potassium) .... One tablet daily 17)  Proair Hfa 108 (90 Base) Mcg/act Aers (Albuterol sulfate) .... Inhale by mouth 1-2 puffs q 6 hours as needed for wheezing. 18)  Bd Insulin Syringe Ultrafine 31g X 5/16" 0.3 Ml Misc (Insulin syringe-needle u-100) .... Use as directed 19)  Lyrica 300 Mg Caps (Pregabalin) .... 2 by mouth at bedtime 20)  Metformin Hcl 500 Mg Xr24h-tab (Metformin hcl) .... 2 by mouth once daily increase to 3 by mouth once daily h 21)  Zofran 4 Mg Tabs (Ondansetron hcl) .Marland Kitchen.. 1-2 by mouth three times a day as needed nausea  Patient Instructions: 1)  will get doppler of left leg. 2)  Decrease humalog   to 20 U with meal   dont adjust  up for 3-4 days unless very high. 3)  INcrease the metformin to  3 by mouth once daily  and monitor  4)  If fasting bg  do not come down in 2 weeks then we can increase the lantus also. slowly  4-6 units at the time. 5)  will do a referral  to Dr Evlyn Kanner. 6)  call 2 weeks about sugars then make plan.   Orders Added: 1)  Capillary Blood Glucose/CBG [82948] 2)  Radiology Referral [Radiology] 3)  Est. Patient Level V [04540] 4)  Endocrinology Referral [Endocrine]   greater than 50% of visit spent in counseling    with husband and patient    45 minutes    wkp.

## 2010-12-24 NOTE — Progress Notes (Addendum)
Summary: BS readings  Phone Note Call from Patient Call back at Home Phone [phone]   Caller: Patient Summary of Call: Pt calling with BS 12/9 107, 12/8 384, 431, 143, 107, 48, 220, 190,194, 12/7- 69, 135, 204, 122, 12/6-216,217,104,12/5-169, 96, 116, 71, 55, 73  Pt states that some of these are fasting and some are just ones she took during the day. She is unsure which are fasting and which are random. Initial call taken by: Romualdo Bolk, CMA Duncan Dull),  October 30, 2010 11:31 AM  Follow-up for Phone Call        Left message for pt to call back. We need to know what insulin she is taking and how much. Follow-up by: Romualdo Bolk, CMA (AAMA),  October 30, 2010 1:16 PM  Additional Follow-up for Phone Call Additional follow up Details #1::        Pt called back saying that she is taking Humalog 30 u and lantus 40 u before lunch.  Additional Follow-up by: Romualdo Bolk, CMA Duncan Dull),  October 30, 2010 2:41 PM    Additional Follow-up for Phone Call Additional follow up Details #2::    We need a time line of when she ate, when she took her insulin and when she took her blood sugar. Romualdo Bolk, CMA (AAMA)  October 30, 2010 2:45 PM Pt called back saying that she is taking humalog 3 times a day; breakfast, lunch and dinner. She started taking Lantus 40 u after she talked to [place] this week.  Follow-up by: Romualdo Bolk, CMA Duncan Dull),  October 30, 2010 3:32 PM  Additional Follow-up for Phone Call Additional follow up Details #3:: Details for Additional Follow-up Action Taken: Pt aware that she needs to check blood sugar before meals. right down the bs and type of insulin given. Pt to pick up book on monday. Additional Follow-up by: Romualdo Bolk, CMA Duncan Dull),  October 30, 2010 5:28 PM

## 2010-12-24 NOTE — Progress Notes (Signed)
Summary: refills to [facility]  Phone Note Call from Patient Call back at Home Phone [phone]   Caller: Spouse Summary of Call: Needs refills sent to [facility]. Initial call taken by: Romualdo Bolk, CMA Duncan Dull),  December 08, 2010 4:54 PM  Follow-up for Phone Call        Pt aware that rx's were sent to [facility] Follow-up by: Romualdo Bolk, CMA (AAMA),  December 08, 2010 4:55 PM    Prescriptions: METFORMIN HCL 500 MG XR24H-TAB (METFORMIN HCL) 1 by mouth once daily and then increase to  2 by mouth once daily after 3 weeks  or as directed  #180 x 2   Entered by:   Romualdo Bolk, CMA (AAMA)   Authorized by:   Madelin Headings MD   Signed by:   Romualdo Bolk, CMA (AAMA) on 12/08/2010   Method used:   Electronically to        [facility]* (retail)             ,          Ph: [phone]       Fax: [phone]   RxID:   [id number] SYMBICORT 160-4.5 MCG/ACT AERO (BUDESONIDE-FORMOTEROL FUMARATE) 2 sprays two times a day   or as directed  #3 x 2   Entered by:   Romualdo Bolk, CMA (AAMA)   Authorized by:   Madelin Headings MD   Signed by:   Romualdo Bolk, CMA (AAMA) on 12/08/2010   Method used:   Electronically to        [facility]* (retail)             ,          Ph: [phone]       Fax: [phone]   RxID:   [id number] PREMARIN 0.625 MG  TABS (ESTROGENS CONJUGATED) 1 by mouth once daily  #90 x 2   Entered by:   Romualdo Bolk, CMA (AAMA)   Authorized by:   Madelin Headings MD   Signed by:   Romualdo Bolk, CMA (AAMA) on 12/08/2010   Method used:   Electronically to        [facility] (retail)             ,          Ph: [phone]       Fax: [phone]   RxID:   [id number] LIPITOR 10 MG  TABS (ATORVASTATIN CALCIUM) 1 by mouth once daily  #90 x 2   Entered by:   Romualdo Bolk, CMA (AAMA)   Authorized by:   Madelin Headings MD   Signed by:   Romualdo Bolk, CMA (AAMA) on 12/08/2010   Method used:   Electronically  to        [facility]* (retail)             ,          Ph: [phone]       Fax: [phone]   RxID:   [id number]

## 2010-12-31 ENCOUNTER — Other Ambulatory Visit: Payer: Self-pay | Admitting: *Deleted

## 2010-12-31 NOTE — Telephone Encounter (Signed)
Spoke with pt and she did see Dr. Evlyn Kanner. Pt aware that we are going to have [facility] send the refill request to him

## 2010-12-31 NOTE — Telephone Encounter (Signed)
Received a refill request for pt's humalog from [facility]. Left message for pt to call back. Pt should have saw Dr. Evlyn Kanner on 12/23/10. If so, then he needs to refill her humalog.

## 2011-02-10 LAB — URINE MICROSCOPIC-ADD ON

## 2011-02-10 LAB — URINALYSIS, ROUTINE W REFLEX MICROSCOPIC
Specific Gravity, Urine: 1.028 (ref 1.005–1.030)
Urobilinogen, UA: 0.2 mg/dL (ref 0.0–1.0)

## 2011-02-24 LAB — DIFFERENTIAL
Basophils Relative: 1 % (ref 0–1)
Eosinophils Absolute: 0.2 10*3/uL (ref 0.0–0.7)
Eosinophils Relative: 1 % (ref 0–5)
Monocytes Absolute: 0.6 10*3/uL (ref 0.1–1.0)
Monocytes Relative: 4 % (ref 3–12)

## 2011-02-24 LAB — POCT CARDIAC MARKERS
CKMB, poc: 1.3 ng/mL (ref 1.0–8.0)
Troponin i, poc: <0.05 ng/mL (ref 0.00–0.09)

## 2011-02-24 LAB — CBC
Hemoglobin: 13.7 g/dL (ref 12.0–15.0)
Platelets: 324 10*3/uL (ref 150–400)
RDW: 13.3 % (ref 11.5–15.5)

## 2011-02-24 LAB — COMPREHENSIVE METABOLIC PANEL
ALT: 18 U/L (ref 0–35)
AST: 26 U/L (ref 0–37)
Albumin: 3.4 g/dL — ABNORMAL LOW (ref 3.5–5.2)
Alkaline Phosphatase: 89 U/L (ref 39–117)
GFR calc Af Amer: >60 mL/min (ref >60)
Glucose, Bld: 163 mg/dL — ABNORMAL HIGH (ref 70–99)
Potassium: 3.7 mEq/L (ref 3.5–5.1)
Sodium: 138 mEq/L (ref 135–145)
Total Protein: 6.6 g/dL (ref 6.0–8.3)

## 2011-02-24 LAB — URINALYSIS, ROUTINE W REFLEX MICROSCOPIC
Nitrite: NEGATIVE
Specific Gravity, Urine: 1.026 (ref 1.005–1.030)
Urobilinogen, UA: 1 mg/dL (ref 0.0–1.0)
pH: 5.5 (ref 5.0–8.0)

## 2011-02-27 LAB — URINALYSIS, ROUTINE W REFLEX MICROSCOPIC
Hgb urine dipstick: NEGATIVE
Nitrite: NEGATIVE
Protein, ur: NEGATIVE mg/dL
Specific Gravity, Urine: 1.035 — ABNORMAL HIGH (ref 1.005–1.030)
Urobilinogen, UA: 0.2 mg/dL (ref 0.0–1.0)

## 2011-02-27 LAB — POCT I-STAT, CHEM 8
Creatinine, Ser: 0.7 mg/dL (ref 0.4–1.2)
HCT: 47 % — ABNORMAL HIGH (ref 36.0–46.0)
Hemoglobin: 16 g/dL — ABNORMAL HIGH (ref 12.0–15.0)
Potassium: 3.7 mEq/L (ref 3.5–5.1)
Sodium: 138 mEq/L (ref 135–145)
TCO2: 24 mmol/L (ref 0–100)

## 2011-02-27 LAB — CBC
MCV: 84.4 fL (ref 78.0–100.0)
Platelets: 327 10*3/uL (ref 150–400)
RDW: 14.9 % (ref 11.5–15.5)
WBC: 12.2 10*3/uL — ABNORMAL HIGH (ref 4.0–10.5)

## 2011-02-27 LAB — DIFFERENTIAL
Basophils Absolute: 0.2 10*3/uL — ABNORMAL HIGH (ref 0.0–0.1)
Eosinophils Absolute: 0 10*3/uL (ref 0.0–0.7)
Eosinophils Relative: 0 % (ref 0–5)
Lymphs Abs: 3.4 10*3/uL (ref 0.7–4.0)
Neutrophils Relative %: 65 % (ref 43–77)

## 2011-02-27 LAB — RAPID URINE DRUG SCREEN, HOSP PERFORMED
Amphetamines: NOT DETECTED
Barbiturates: NOT DETECTED
Opiates: POSITIVE — AB

## 2011-02-27 LAB — ETHANOL: Alcohol, Ethyl (B): <5 mg/dL (ref 0–10)

## 2011-02-28 LAB — CBC
Hemoglobin: 12.8 g/dL (ref 12.0–15.0)
MCHC: 32.9 g/dL (ref 30.0–36.0)
RBC: 4.55 MIL/uL (ref 3.87–5.11)

## 2011-02-28 LAB — GLUCOSE, CAPILLARY

## 2011-02-28 LAB — POCT I-STAT, CHEM 8
BUN: 7 mg/dL (ref 6–23)
Chloride: 101 mEq/L (ref 96–112)
HCT: 40 % (ref 36.0–46.0)
Potassium: 3.8 mEq/L (ref 3.5–5.1)

## 2011-02-28 LAB — URINALYSIS, ROUTINE W REFLEX MICROSCOPIC
Nitrite: NEGATIVE
Specific Gravity, Urine: 1.026 (ref 1.005–1.030)
Urobilinogen, UA: 0.2 mg/dL (ref 0.0–1.0)
pH: 5.5 (ref 5.0–8.0)

## 2011-02-28 LAB — DIFFERENTIAL
Basophils Absolute: 0.1 10*3/uL (ref 0.0–0.1)
Basophils Relative: 1 % (ref 0–1)
Lymphocytes Relative: 29 % (ref 12–46)
Monocytes Relative: 7 % (ref 3–12)
Neutro Abs: 7.6 10*3/uL (ref 1.7–7.7)
Neutrophils Relative %: 61 % (ref 43–77)

## 2011-03-02 LAB — GLUCOSE, CAPILLARY: Glucose-Capillary: 279 mg/dL — ABNORMAL HIGH (ref 70–99)

## 2011-03-08 LAB — URINALYSIS, ROUTINE W REFLEX MICROSCOPIC
Bilirubin Urine: NEGATIVE
Hgb urine dipstick: NEGATIVE
Nitrite: NEGATIVE
Protein, ur: NEGATIVE mg/dL
Specific Gravity, Urine: 1.015 (ref 1.005–1.030)
Urobilinogen, UA: 0.2 mg/dL (ref 0.0–1.0)

## 2011-03-08 LAB — COMPREHENSIVE METABOLIC PANEL
ALT: 26 U/L (ref 0–35)
AST: 33 U/L (ref 0–37)
Albumin: 3.6 g/dL (ref 3.5–5.2)
Alkaline Phosphatase: 100 U/L (ref 39–117)
Chloride: 93 mEq/L — ABNORMAL LOW (ref 96–112)
GFR calc Af Amer: >60 mL/min (ref >60)
Potassium: 3.8 mEq/L (ref 3.5–5.1)
Sodium: 133 mEq/L — ABNORMAL LOW (ref 135–145)
Total Bilirubin: 0.5 mg/dL (ref 0.3–1.2)

## 2011-03-08 LAB — CBC
Platelets: 309 10*3/uL (ref 150–400)
RBC: 4.71 MIL/uL (ref 3.87–5.11)
WBC: 16.3 10*3/uL — ABNORMAL HIGH (ref 4.0–10.5)

## 2011-03-08 LAB — DIFFERENTIAL
Basophils Absolute: 0.2 10*3/uL — ABNORMAL HIGH (ref 0.0–0.1)
Eosinophils Relative: 1 % (ref 0–5)
Lymphocytes Relative: 26 % (ref 12–46)
Lymphs Abs: 4.2 10*3/uL — ABNORMAL HIGH (ref 0.7–4.0)
Neutrophils Relative %: 69 % (ref 43–77)

## 2011-03-08 LAB — GLUCOSE, CAPILLARY
Glucose-Capillary: 140 mg/dL — ABNORMAL HIGH (ref 70–99)
Glucose-Capillary: 376 mg/dL — ABNORMAL HIGH (ref 70–99)

## 2011-03-08 LAB — LIPASE, BLOOD: Lipase: 31 U/L (ref 11–59)

## 2011-04-04 ENCOUNTER — Inpatient Hospital Stay (INDEPENDENT_AMBULATORY_CARE_PROVIDER_SITE_OTHER)
Admission: RE | Admit: 2011-04-04 | Discharge: 2011-04-04 | Disposition: A | Discharge status: Home / Self Care | Payer: Medicare Other | Source: Ambulatory Visit | Attending: Family Medicine | Admitting: Family Medicine

## 2011-04-04 DIAGNOSIS — T148XXA Other injury of unspecified body region, initial encounter: Secondary | ICD-10-CM

## 2011-04-06 NOTE — Consult Note (Signed)
NAMEALEXANDRYA, Joyce Richardson             ACCOUNT NO.:  [id number]   MEDICAL RECORD NO.:  [id number]          PATIENT TYPE:  EMS   LOCATION:  [facility]                         FACILITY:  [facility]   PHYSICIAN:  Deanna Artis. Hickling, M.D.DATE OF BIRTH:  08-Feb-1946   DATE OF CONSULTATION:  04/02/2009  DATE OF DISCHARGE:  04/02/2009                                 CONSULTATION   CHIEF COMPLAINT:  Left occipital headache, room spinning, nausea.   HISTORY OF PRESENT ILLNESS:  A 65 year old woman with simple partial  seizures with visual symptomatology and single generalized tonic-clonic  seizure, which occurred 20 years ago.  The patient was on carbamazepine,  which cause hyponatremia and was switched to Lamictal, which recently  has made her quite dizzy and unsteady.  She has been switching over to  Keppra.   The patient has not felt well over the past couple of weeks.  She has  had temperature up to 101, 10 pounds weight loss, and has felt generally  poorly.  She went to bed at the usual time, but was up and down going to  the bathroom.  She last went to bed at her baseline at 3:20 a.m. and  awakened at 5:20 a.m. with the symptoms noted above.  Her headache is  less but persists.  Movement initiates her vertigo.  I was asked to come  in to evaluate for possible cerebellar stroke.   PAST MEDICAL HISTORY:  Remarkable for type 2 diabetes mellitus.  She is  on his lipid medications, but said she does not have problems with  cholesterol.  She has irritable bowel syndrome, chronic fatigue,  anxiety, depression, sleep apnea, seasonal allergies, postmenopausal.   CURRENT MEDICATIONS:  1. Lantus 10 units at bedtime and 6 units as needed.  2. Lipitor 10 mg daily.  3. Lisinopril 20 mg three times daily.  4. Glycopyrrolate 2 mg up to 2 in the evening.  5. MiraLax 0.5? mg up to 2 in the evening.  6. Hydrocodone/acetaminophen 5/500 one to two in the evening.  7. Phenadoz 25 mg suppositories.  8. Keppra 100 mg  twice daily and 50 mg at bedtime.  9. Methylin 10 mg twice daily.  10.Effexor XR 150 mg in the morning.  11.Seroquel 150 mg at bedtime.  12.Alprazolam 1 mg in the morning.  13.Flonase 50 mcg 1 puff as needed.  14.Premarin 0.625 mg in the morning.  15.Centrum Silver every morning.  16.Vitamin B12 1000 mcg every morning.   DRUG ALLERGIES:  PENICILLIN.   INTOLERANCE:  LAMICTAL, TORADOL.   REVIEW OF SYSTEMS:  Ten-pound weight loss, temperature up to 101, but  she is now afebrile.  No anemia.  Complains of nausea, vomiting,  vertigo, lightheadedness, tinnitus, a longstanding cough, polyuria,  diabetes mellitus, decreased appetite, and claudication with her legs  when she walks, joint pain.  No numbness, tingling, loss of  consciousness, tremor, Parkinson symptoms, history of diabetes, anxiety,  depression.   FAMILY HISTORY:  Positive for hypertension in her grandparents, diabetes  in her mother, seizure in paternal grandmother.  Father died of kidney  disease.  Mother has Alzheimer's.  PHYSICAL EXAMINATION:  VITAL SIGNS:  124/76, resting pulse 83,  respirations 18, temperature 96.9.  HEAD AND NECK:  No signs of infection.  No bruits.  LUNGS:  Clear.  HEART:  No murmurs.  Pulses normal.  ABDOMEN:  Active bowel sounds, slightly tender.  NEUROLOGIC:  Awake, alert, attentive, appropriate.  Cranial Nerves:  Round and reactive pupils.  Visual fields full.  Extraocular movements  full and conjugate.  Symmetric facial strength.  Midline tongue and  uvula.  Air conduction greater than bone conduction.  No nystagmus  except on extreme lateral gaze to the left.  Sensation was intact in her  face.  Shoulder shrug was normal.  She has normal strength, good fine  motor movements, no pronator drift.  Sensation is inconsistent.  She  says that the left arm is somewhat numb in comparison with the right  arm, both to pinprick and cold, but is normal everywhere else.  She had  good stereoagnosis  bilaterally.  Finger-to-nose, heel-knee-shin were  normal.  Gait was not tested.  Deep tendon reflexes were absent.  The  patient had bilateral flexor plantar responses.   IMPRESSION:  Headache, vertigo, nausea.  We need to rule out cerebellar  infarction.  To be done with a limited MRI scan.  We will likely give  her fluids in the emergency room and we will draw blood for CBC with  diff and comprehensive metabolic panel.  She has had an EKG, which I  have reviewed and shows a regular sinus rhythm with borderline T  abnormalities.  (784.0,780.4)      Deanna Artis. Sharene Skeans, M.D.  Electronically Signed     [facility]  D:  04/02/2009  T:  04/02/2009  Job:  [id number]   cc:   Genene Churn. Love, M.D.  Neta Mends. Fabian Sharp, MD

## 2011-04-09 NOTE — Op Note (Signed)
[facility]  Patient:    Joyce Richardson, Joyce Richardson                      MRN: [id number] Proc. Date: 10/26/00 Adm. Date:  [id number] Attending:  Charlton Haws CC:         Aura Dials, M.D.   Operative Report  ACCOUNT NUMBER: [id number]  OFFICE MEDICAL NUMBER: [id number] [phone]  PREOPERATIVE DIAGNOSIS:  Chronic cholecystitis with elevated liver functions.  POSTOPERATIVE DIAGNOSIS: Chronic cholecystitis with elevated liver functions.  OPERATION:  Laparoscopic cholecystectomy with intraoperative cholangiogram.  SURGEON:  Currie Paris, M.D.  ASSISTANT:  Donnie Coffin. Samuella Cota, M.D.  ANESTHESIA:  General endotracheal.  CLINICAL HISTORY:  This patient is a 65 year old with biliary-type symptoms who was found to have sludge on gallbladder and slightly elevated liver functions, although total bilirubin was normal.  DESCRIPTION OF PROCEDURE:  The patient was brought to the operating room, and, after satisfactory general endotracheal anesthesia had obtained, the abdomen was  prepped, and draped.  I used 0.25% plain Marcaine at each incision.  The umbilical incision was made and the fascia opened and peritoneal cavity entered under direct vision.  A pursestring was placed and the Hasson introduced and the abdomen insufflated to 15.  A 360 degree check of the abdomen showed no gross abnormalities, although there were a few adhesions at the midline below the umbilicus.  There was no evidence of any bowel problems. the gallbladder was tense and distended but not acutely inflamed.  The patient was placed in reverse Trendelenburg and tilted to the left.  Three additional cannulas were placed in the usual positions.  The cystic duct was dissected out and could be clearly seen and the common duct well seen, and the junction of the cystic duct and gallbladder well identified.  The cystic artery was seen just posteriorly in its usual position.  Cystic duct was clipped  at the junction with the gallbladder and opened.  A little bile drained out.  I used a 14 Angiocath and placed a Reddick catheter into the abdominal cavity and slipped it into the cystic duct and held it in placed with a clip.  cholangiography showed somewhat large common duct but good flow into the duodenum with no stones and no evidence of obstruction, good filling of the packed radicles.  The cystic duct catheter was removed and three clips placed on the stay side of the cystic duct, and it was divided.  The cystic artery was dissected out a little bit farther, clipped on the stay side and one on the go side and divided.  A small area of tissue was also clipped and then the gallbladder removed from below to above with coagulation acquired with cautery.  Just prior to disconnecting, we checked to make sure that everything was dry and then disconnected it.  The gallbladder was then grasped through the umbilical port and brought out. We had to open and decompress some of the bile to get it out.  The abdomen was reinflated, and there was no evidence of any bleeding or problems.  The lateral ports were removed under direct vision.  There was no bleeding.  The umbilical port was removed and the pursestring tied down.  The abdomen was deflated through the epigastric port.  Skin was closed with 4-0 Monocryl subcuticular plus Steri-Strips.   The patient tolerated the procedure well. There were no operative complications.  All counts were correct. DD:  10/26/00 TD:  10/26/00 Job:  [id number]

## 2011-04-09 NOTE — Procedures (Signed)
Joyce Richardson, Joyce Richardson             ACCOUNT NO.:  [id number]   MEDICAL RECORD NO.:  [id number]          PATIENT TYPE:  OUT   LOCATION:  SLEEP CENTER                 FACILITY:  [facility]   PHYSICIAN:  Clinton D. Maple Hudson, M.D. DATE OF BIRTH:  January 01, 1946   DATE OF STUDY:  08/31/2004                              NOCTURNAL POLYSOMNOGRAM   REFERRING PHYSICIAN: Earl Lites, MD:   INDICATIONS FOR STUDY:  Hypersomnia with sleep apnea. Epworth sleepiness  score 0/24, BMI 31, weight 160 pounds.   MEDICATIONS:  Effexor, Lamictal, Xanax, and Seroquel which may be expected  to affect sleep.   SLEEP ARCHITECTURE:  Total sleep time 416 minutes with sleep efficiency 87%.  Stage I was 8%, stage II was 77%, stages II and IV were absent, REM was 14%  of total sleep time. Latency to sleep onset 24 minutes, latency to REM 208  minutes. Awake after sleep onset 42 minutes, arousal index 72 which is  markedly increased, but not clearly associated with specific triggering  events. She had taken her routine medications before the study.   RESPIRATORY DATA:  RDI 0.1, which is within normal limits and reflected a  single obstructive apnea.   OXYGEN DATA:  Loud snoring with normal oxygenation, nadir of 91% on room  air. Mean oxygen saturation through the study was 95%.   CARDIAC DATA:  Normal sinus rhythm.   MOVEMENT/PARASOMNIA:  Bathroom times two. 284 limb jerks were recorded of  which 21 were associated with arousal or awakening for a periodic limb  movement with arousal index of 3 per hour which is mildly increased.   IMPRESSION/RECOMMENDATIONS:  A discrepancy is noted between her complaint of  oppressive daytime sleepiness and her self-recorded Epworth sleepiness score  of 0/24. There was no significant sleep disordered breathing, but loud  snoring with normal oxygenation and frequent nonspecific EEG arousals were  noted.  Mild periodic limb movement with arousal syndrome may contribute  marginally. Suggest  treating as insomnia with conservative measures  addressing snoring, including nasal decongestants and sleep position off the  flat of the back.                                                           Clinton D. Maple Hudson, M.D.  Diplomate, [employer] Board   CDY/MEDQ  D:  09/06/2004 11:06:33  T:  09/06/2004 21:04:54  Job:  [id number]

## 2011-04-12 ENCOUNTER — Encounter: Payer: Self-pay | Admitting: Internal Medicine

## 2011-04-30 ENCOUNTER — Telehealth: Payer: Self-pay | Admitting: Internal Medicine

## 2011-04-30 NOTE — Telephone Encounter (Signed)
Pts spouse called and said that wife has bursitis in both hips. Pt usually gets cortizone injs in hips. Pt is in a lot of pain. Is there anyone at [facility] that can give inj today. Pls call pts spouse.

## 2011-04-30 NOTE — Telephone Encounter (Signed)
Spoke to pt and she hasn't had one in years. Pt has never seen ortho for this. She was given the last by the Urgent Care. Pt wants one in the joint. I told pt that we don't normally do this here. I advise pt to call [facility] to see if they can work her in today.

## 2011-05-09 NOTE — Telephone Encounter (Signed)
Please document and or get notes of evaluation  She had and how she is doing .

## 2011-05-10 ENCOUNTER — Other Ambulatory Visit: Payer: Self-pay | Admitting: Orthopaedic Surgery

## 2011-05-10 DIAGNOSIS — M545 Low back pain: Secondary | ICD-10-CM

## 2011-05-10 NOTE — Telephone Encounter (Signed)
Spoke to Pt and she is doing great, blood sugar under control. She has to go back today for another inj. She will have them send [place] a copy of the notes.

## 2011-05-15 ENCOUNTER — Ambulatory Visit
Admission: RE | Admit: 2011-05-15 | Discharge: 2011-05-15 | Disposition: A | Discharge status: Home / Self Care | Payer: Medicare Other | Source: Ambulatory Visit | Attending: Orthopaedic Surgery | Admitting: Orthopaedic Surgery

## 2011-05-15 DIAGNOSIS — M545 Low back pain: Secondary | ICD-10-CM

## 2011-07-14 ENCOUNTER — Emergency Department (INDEPENDENT_AMBULATORY_CARE_PROVIDER_SITE_OTHER): Payer: Medicare Other

## 2011-07-14 ENCOUNTER — Emergency Department (HOSPITAL_BASED_OUTPATIENT_CLINIC_OR_DEPARTMENT_OTHER)
Admission: EM | Admit: 2011-07-14 | Discharge: 2011-07-14 | Disposition: A | Discharge status: Home / Self Care | Payer: Medicare Other | Attending: Emergency Medicine | Admitting: Emergency Medicine

## 2011-07-14 ENCOUNTER — Telehealth: Payer: Self-pay | Admitting: *Deleted

## 2011-07-14 ENCOUNTER — Encounter (HOSPITAL_BASED_OUTPATIENT_CLINIC_OR_DEPARTMENT_OTHER): Payer: Self-pay | Admitting: *Deleted

## 2011-07-14 DIAGNOSIS — W19XXXA Unspecified fall, initial encounter: Secondary | ICD-10-CM

## 2011-07-14 DIAGNOSIS — R079 Chest pain, unspecified: Secondary | ICD-10-CM

## 2011-07-14 DIAGNOSIS — Z79899 Other long term (current) drug therapy: Secondary | ICD-10-CM | POA: Insufficient documentation

## 2011-07-14 DIAGNOSIS — Z9189 Other specified personal risk factors, not elsewhere classified: Secondary | ICD-10-CM

## 2011-07-14 DIAGNOSIS — I10 Essential (primary) hypertension: Secondary | ICD-10-CM | POA: Insufficient documentation

## 2011-07-14 DIAGNOSIS — E785 Hyperlipidemia, unspecified: Secondary | ICD-10-CM | POA: Insufficient documentation

## 2011-07-14 DIAGNOSIS — M549 Dorsalgia, unspecified: Secondary | ICD-10-CM

## 2011-07-14 DIAGNOSIS — M545 Low back pain, unspecified: Secondary | ICD-10-CM | POA: Insufficient documentation

## 2011-07-14 DIAGNOSIS — M546 Pain in thoracic spine: Secondary | ICD-10-CM

## 2011-07-14 DIAGNOSIS — J449 Chronic obstructive pulmonary disease, unspecified: Secondary | ICD-10-CM | POA: Insufficient documentation

## 2011-07-14 DIAGNOSIS — J4489 Other specified chronic obstructive pulmonary disease: Secondary | ICD-10-CM | POA: Insufficient documentation

## 2011-07-14 DIAGNOSIS — E119 Type 2 diabetes mellitus without complications: Secondary | ICD-10-CM | POA: Insufficient documentation

## 2011-07-14 DIAGNOSIS — K219 Gastro-esophageal reflux disease without esophagitis: Secondary | ICD-10-CM | POA: Insufficient documentation

## 2011-07-14 MED ORDER — OXYCODONE-ACETAMINOPHEN 5-325 MG PO TABS: 1.0000 | ORAL_TABLET | ORAL | Status: AC | PRN

## 2011-07-14 MED ORDER — DIAZEPAM 5 MG PO TABS
5.0000 mg | ORAL_TABLET | Freq: Once | ORAL | Status: AC
  Administered 2011-07-14: 5 mg via ORAL
  Filled 2011-07-14: qty 1

## 2011-07-14 MED ORDER — OXYCODONE-ACETAMINOPHEN 5-325 MG PO TABS
2.0000 | ORAL_TABLET | Freq: Once | ORAL | Status: AC
  Administered 2011-07-14: 2 via ORAL
  Filled 2011-07-14: qty 2

## 2011-07-14 MED ORDER — CYCLOBENZAPRINE HCL 10 MG PO TABS: 10.0000 mg | ORAL_TABLET | Freq: Two times a day (BID) | ORAL | Status: AC | PRN

## 2011-07-14 NOTE — ED Notes (Signed)
Pt c/o mid back pain x 5 days ago from fall.

## 2011-07-14 NOTE — ED Provider Notes (Signed)
History     CSN: [id number] Arrival date & time: 07/14/2011  2:41 PM  Chief Complaint  Patient presents with  . Fall  . Back Pain   Patient is a 65 y.o. female presenting with fall and back pain. The history is provided by the patient and the spouse.  Fall Incident onset: 4 days ago. Incident: at home, was walking and fell backwards striking her back on a step. Pain location: low bac, mid back and left ribs without difficulty breathing. no SOB. The pain is moderate. She was ambulatory at the scene. Pertinent negatives include no numbness, no abdominal pain, no vomiting and no tingling. Associated symptoms comments: No weakness of arms or legs. No neck pain . The symptoms are aggravated by activity. She has tried NSAIDs for the symptoms. The treatment provided mild relief.  Back Pain  Pertinent negatives include no numbness, no abdominal pain and no tingling.    Past Medical History  Diagnosis Date  . DIABETES MELLITUS, TYPE II 08/09/2007  . HYPERLIPIDEMIA 08/09/2007  . LEUKOCYTOSIS 10/20/2010  . ANXIETY 02/09/2010  . ABUSE, ALCOHOL, IN REMISSION 08/09/2007  . TOBACCO USE 08/09/2007  . NARCOTIC ABUSE 12/18/2008  . DEPRESSION 08/09/2007  . HYPERTENSION 09/07/2007  . Chronic diastolic heart failure 02/10/2010  . BRONCHITIS, CHRONIC 05/05/2009  . COPD 02/22/2008  . GERD 08/09/2007  . DIVERTICULOSIS-COLON 10/07/2008  . IBS 09/30/2008  . SEIZURE DISORDER 08/09/2007  . ABDOMINAL PAIN, GENERALIZED, CHRONIC 09/30/2008  . INCREASED BLOOD PRESSURE 08/09/2007  . PERSONAL HX COLONIC POLYPS 10/07/2008  . DIVERTICULITIS, HX OF 08/09/2007  . Personal history of failed moderate sedation 10/07/2008  . DRUG ABUSE, HX OF 12/22/2009    Past Surgical History  Procedure Date  . Appendectomy   . Abdominal hysterectomy   . Cholecystectomy   . Hemorrhoid surgery     History reviewed. No pertinent family history.  History  Substance Use Topics  . Smoking status: Current Everyday Smoker -- 0.2 packs/day  .  Smokeless tobacco: Not on file  . Alcohol Use: No    OB History    Grav Para Term Preterm Abortions TAB SAB Ect Mult Living                  Review of Systems  Gastrointestinal: Negative for vomiting and abdominal pain.  Musculoskeletal: Positive for back pain.  Neurological: Negative for tingling and numbness.  All other systems reviewed and are negative.    Physical Exam  BP 126/58  Pulse 80  Temp(Src) 98 F (36.7 C) (Oral)  Resp 18  Wt 180 lb (81.647 kg)  Physical Exam  Nursing note and vitals reviewed. Constitutional: She is oriented to person, place, and time. She appears well-developed and well-nourished. No distress.  HENT:  Head: Normocephalic and atraumatic.  Eyes: EOM are normal.  Neck: Normal range of motion.  Cardiovascular: Normal rate, regular rhythm and normal heart sounds.   Pulmonary/Chest: Effort normal and breath sounds normal.  Abdominal: Soft. She exhibits no distension. There is no tenderness.  Musculoskeletal: Normal range of motion.       Right hip: Normal.       Left hip: Normal.       Cervical back: She exhibits no bony tenderness and no pain.       Thoracic back: She exhibits tenderness, bony tenderness and pain. She exhibits no deformity and no spasm.       Lumbar back: She exhibits tenderness, bony tenderness, pain and spasm.  Back:  Neurological: She is alert and oriented to person, place, and time.  Skin: Skin is warm and dry.  Psychiatric: She has a normal mood and affect. Judgment normal.    ED Course  Procedures  MDM Will image L and T spine. Pt with some tenderness to left posterior lateral ribs as well. Xray of chest and ribs pending. Pain treated in ER. Normal LE neuro exam  Dg Ribs Unilateral W/chest Left  07/14/2011  *RADIOLOGY REPORT*  Clinical Data: Fall, pain  LEFT RIBS AND CHEST - 3+ VIEW  Comparison: 12/26/2009  Findings: Heart size is normal.  Negative for heart failure or pneumonia.  Lungs are clear.  Negative  for left rib fracture.  IMPRESSION: No acute abnormality.  Original Report Authenticated By: Camelia Phenes, M.D.   Dg Thoracic Spine 2 View  07/14/2011  *RADIOLOGY REPORT*  Clinical Data: .  Back pain  THORACIC SPINE - 2 VIEW  Comparison: None.  Findings: Negative for thoracic fracture.  No significant degenerative change.  No focal bony lesion.  IMPRESSION: Negative  Original Report Authenticated By: Camelia Phenes, M.D.   Dg Lumbar Spine Complete  07/14/2011  *RADIOLOGY REPORT*  Clinical Data: Fall, back pain  LUMBAR SPINE - COMPLETE 4+ VIEW  Comparison: None.  Findings: Negative for fracture.  Normal lumbar alignment.  No significant degenerative change is identified.  IMPRESSION: Negative  Original Report Authenticated By: Camelia Phenes, M.D.   4:03 PM Pt reports she feels much better. I addressed prior narcotic abuse. The pt admits to this. Husband will manage her pain medicine. Everyone is in agreement       Lyanne Co, MD 07/14/11 [phone]

## 2011-07-14 NOTE — Telephone Encounter (Signed)
Pt fell on Sunday- she slipped on the steps. Her back is sore but on the left side is all swollen. No fever. Pt is in a lot of pain. I told pt to go to [facility] on [address] to get evaluated and see if they need any x-rays done.

## 2011-08-19 ENCOUNTER — Other Ambulatory Visit: Payer: Self-pay | Admitting: Internal Medicine

## 2011-08-23 ENCOUNTER — Ambulatory Visit: Payer: Medicare Other | Admitting: Cardiology

## 2011-08-23 LAB — GLUCOSE, CAPILLARY

## 2011-08-24 LAB — GLUCOSE, CAPILLARY: Glucose-Capillary: 145 — ABNORMAL HIGH

## 2011-10-11 ENCOUNTER — Ambulatory Visit (INDEPENDENT_AMBULATORY_CARE_PROVIDER_SITE_OTHER): Payer: Medicare Other | Admitting: Cardiology

## 2011-10-11 ENCOUNTER — Encounter: Payer: Self-pay | Admitting: Cardiology

## 2011-10-11 DIAGNOSIS — R0609 Other forms of dyspnea: Secondary | ICD-10-CM

## 2011-10-11 DIAGNOSIS — I10 Essential (primary) hypertension: Secondary | ICD-10-CM

## 2011-10-11 DIAGNOSIS — I509 Heart failure, unspecified: Secondary | ICD-10-CM

## 2011-10-11 DIAGNOSIS — E785 Hyperlipidemia, unspecified: Secondary | ICD-10-CM

## 2011-10-11 DIAGNOSIS — I5032 Chronic diastolic (congestive) heart failure: Secondary | ICD-10-CM

## 2011-10-11 DIAGNOSIS — J449 Chronic obstructive pulmonary disease, unspecified: Secondary | ICD-10-CM

## 2011-10-11 LAB — MAGNESIUM: Magnesium: 1.7 mg/dL (ref 1.5–2.5)

## 2011-10-11 LAB — BASIC METABOLIC PANEL
Calcium: 8.3 mg/dL — ABNORMAL LOW (ref 8.4–10.5)
GFR: 73.87 mL/min (ref >60.00)
Potassium: 3.6 mEq/L (ref 3.5–5.1)
Sodium: 138 mEq/L (ref 135–145)

## 2011-10-11 MED ORDER — TIOTROPIUM BROMIDE MONOHYDRATE 18 MCG IN CAPS: 18.0000 ug | ORAL_CAPSULE | Freq: Every day | RESPIRATORY_TRACT | Status: DC

## 2011-10-11 NOTE — Progress Notes (Signed)
PCP: Dr. Fabian Sharp  65 yo with history of hyperlipidemia, HTN, and diabetes presented initially for evaluation of shortness of breath. She has COPD with mild obstruction on PFTs and notes frequent wheezing. She had an echo done showing preserved systolic function but moderate diastolic dysfunction. Lexiscan myoview (3/11) was likely normal with anterior attenuation.   I started the patient on a diuretic trial with Lasix. This improved her exertional dyspnea.  Symbicort also helped.    Currently, patient denies significant exertional dyspnea when walking on flat ground.  She does report more wheezing over the last few months.  She continues to smoke about 1/2 ppd and is not interested in quitting. No chest pain.  She has had some cramping in her toes and wonders if her potassium is low.    ECG: NSR, normal  Labs (2/11): LDL 94, HDL 64, creatinine 0.8, BNP 21  Labs (4/11): K 4.1, creatinine 0.9  Labs (5/12): K 4.4, creatinine 0.8, LDL 92, HDL 41  Allergies (verified):  1) ! Penicillin  2) * All Narcotics  3) * Alchol Base Drugs  4) Hyoscyamine   Past Medical History:  1. HYPERTENSION (ICD-401.9)  2. HYPERLIPIDEMIA (ICD-272.4)  3. ANXIETY (ICD-300.00)  4. 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 Hx of NARCOTIC ABUSE (ICD-305.90): [facility] summer 2010.  7. DIVERTICULOSIS-COLON (ICD-562.10)  8. IBS (ICD-564.1)  9. COPD (ICD-496): PFTs (3/11) FVC 80%, FEV1 74%, ratio 66%, TLC 90%, DLCO 54%. Mild obstruction, some response to bronchodilator.  10. TOBACCO USE (ICD-305.1)  11. ABUSE, ALCOHOL, IN REMISSION (ICD-305.03)  12. SEIZURE DISORDER (ICD-780.39)  13. GERD (ICD-530.81)  14. DIABETES MELLITUS, TYPE II (ICD-250.00)  15. DEPRESSION (ICD-311)per dr. Nolen Mu  16. ALLERGIC RHINITIS (ICD-477.9)  17. Hyponatremia with tegretol  18. Diastolic CHF: Echo (3/11) with EF 55-60%, moderate (grade II) diastolic dysfunction, mild LAE  19. Lexiscan myoview (3/11): likely normal with anterior  attenuation. EF 68%. Low risk study.   Family History:  Family History of father  2 suicides in family  Family History of Colon Cancer:Mothers sister  Family History of Diabetes: Both sides of family, parents, grandparents  Family History of Heart Disease: Both sides of family, parents, grandparents   Social History:  Married 3rd time  Current Smoker 1 ppd per day since in 46s down now to about 1/2 ppd attends AA  Husband with MS on disability getting worse. in [facility] a good bit  from [place] originally.  Former Engineer, civil (consulting)  Daily Caffeine Use-1/3 cup daily  Illicit Drug Use - no  Patient does not get regular exercise.   ROS: All systems reviewed and negative except as per HPI.   Current Outpatient Prescriptions  Medication Sig Dispense Refill  . albuterol (PROAIR HFA) 108 (90 BASE) MCG/ACT inhaler Inhale 1-2 puffs into the lungs every 4 (four) hours as needed. For shortness of breath or wheezing       . aspirin 81 MG tablet Take 81 mg by mouth daily.       Marland Kitchen atorvastatin (LIPITOR) 10 MG tablet TAKE 1 TABLET DAILY  90 tablet  0  . budesonide-formoterol (SYMBICORT) 160-4.5 MCG/ACT inhaler Inhale 2 puffs into the lungs once as needed. For congestion      . exenatide (BYETTA) 10 MCG/0.04ML SOLN Inject into the skin 2 (two) times daily with a meal.        . furosemide (LASIX) 20 MG tablet Take 20 mg by mouth daily.        . insulin glargine (LANTUS) 100 UNIT/ML  injection Inject 30 Units into the skin at bedtime.       . levETIRAcetam (KEPPRA) 250 MG tablet Take 250 mg by mouth 2 (two) times daily.       Marland Kitchen losartan (COZAAR) 25 MG tablet Take 25 mg by mouth daily.       . metFORMIN (GLUCOPHAGE) 500 MG tablet Take 1,500 mg by mouth daily.       . MULTIPLE VITAMIN PO Take 1 tablet by mouth daily.       . ondansetron (ZOFRAN) 4 MG tablet Take by mouth. 1-2 three times a day as needed for nausea       . potassium chloride (KLOR-CON) 10 MEQ CR tablet Take 10 mEq by mouth daily.        . pregabalin  (LYRICA) 300 MG capsule Take 600 mg by mouth at bedtime. 2 by mouth at bedtime      . PREMARIN 0.625 MG tablet TAKE 1 TABLET DAILY  90 tablet  0  . QUEtiapine (SEROQUEL) 100 MG tablet Take 200 mg by mouth at bedtime.       Marland Kitchen venlafaxine (EFFEXOR XR) 150 MG 24 hr capsule Take 150 mg by mouth 2 (two) times daily.        Marland Kitchen glucose blood (ACCU-CHEK AVIVA) test strip 1 each by Other route as needed. Use as instructed       . Insulin Syringe-Needle U-100 (B-D INSULIN SYRINGE) 31G X 5/16" 0.3 ML MISC by Does not apply route.        . Magnesium 200 MG TABS Take by mouth.      Marland Kitchen omeprazole (PRILOSEC) 20 MG capsule 20 tablets Daily.      Marland Kitchen tiotropium (SPIRIVA HANDIHALER) 18 MCG inhalation capsule Place 1 capsule (18 mcg total) into inhaler and inhale daily.  90 capsule  3    BP 110/70  Pulse 76  Ht 5' (1.524 m)  Wt 92.534 kg (204 lb)  BMI 39.84 kg/m2 General: NAD, obese Neck: No JVD, no thyromegaly or thyroid nodule.  Lungs: bilateral diffuse end expiratory wheezes CV: Nondisplaced PMI.  Heart regular S1/S2, no S3/S4, no murmur.  No peripheral edema.  No carotid bruit.  Normal pedal pulses.  Abdomen: Soft, nontender, no hepatosplenomegaly, no distention.  Neurologic: Alert and oriented x 3.  Psych: Normal affect. Extremities: No clubbing or cyanosis.

## 2011-10-11 NOTE — Patient Instructions (Signed)
Your physician recommends that you have  lab work today--BMET/Magnesium 786.09  428.32  Take Magnesium Oxide 200mg  daily--you can get this without a prescription.  Use Spiriva Inhaler daily.  Your physician wants you to follow-up in: 1 year with Dr Shirlee Latch. (November 2013). You will receive a reminder letter in the mail two months in advance. If you don't receive a letter, please call our office to schedule the follow-up appointment.

## 2011-10-12 ENCOUNTER — Telehealth: Payer: Self-pay | Admitting: Cardiology

## 2011-10-12 NOTE — Telephone Encounter (Signed)
Discussed recent lab results with pt.  

## 2011-10-12 NOTE — Assessment & Plan Note (Signed)
Lipids excellent in 5/12.

## 2011-10-12 NOTE — Assessment & Plan Note (Signed)
Patient looks euvolemic.  She has only mild exertional dyspnea (COPD also likely plays a large role in this).  She has some cramping in her toes, often at night.  She takes Lasix 20 mg daily.  I will check Mg and K levels today and will empirically try her on magnesium oxide 200 mg daily.

## 2011-10-12 NOTE — Telephone Encounter (Signed)
Fu call °Pt returning your call  °

## 2011-10-12 NOTE — Assessment & Plan Note (Signed)
Patient has diffuse end expiratory wheezes.  She says that this has worsened over the last couple of months. She is on Symbicort.  I will also give her a prescription for Spiriva.  She needs to quit smoking but she is not ready to do this.

## 2011-10-13 ENCOUNTER — Encounter: Payer: Self-pay | Admitting: Internal Medicine

## 2011-10-22 ENCOUNTER — Other Ambulatory Visit: Payer: Self-pay | Admitting: Orthopaedic Surgery

## 2011-10-24 ENCOUNTER — Ambulatory Visit
Admission: RE | Admit: 2011-10-24 | Discharge: 2011-10-24 | Disposition: A | Discharge status: Home / Self Care | Payer: Medicare Other | Source: Ambulatory Visit | Attending: Orthopaedic Surgery | Admitting: Orthopaedic Surgery

## 2011-10-26 ENCOUNTER — Other Ambulatory Visit: Payer: Self-pay | Admitting: Internal Medicine

## 2011-12-09 NOTE — H&P (Signed)
Joyce Richardson MRN:  [id number] DOB/SEX:  1946/04/22/female     HISTORY AND PHYSICAL   CHIEF COMPLAINT:  Painful left Shoulder  HISTORY: Joyce Richardson a 66 y.o. female seen on the 16th of November 2012. At that time she was complaining of pain in the left shoulder. It developed without any history of injury or trauma but she was having significant pain and discomfort. She also notes that she was not moving her shoulder as well. At that time she had generalized weakness and loss range of motion and I chose a subacromial injection. At the time of the injection she had been having improvement; however, over the last 2 weeks her symptoms have returned. She unfortunately is symptomatic in regards to her left shoulder.   She states now most recently again she doesn't remember any particular injury or trauma recently but started having pain in her right shoulder. She has very similar symptoms and more impingement-type symptoms. She certainly has not as much problems with her range of motion on the right as she does on her left. She denies any cervical spine pain. She grades her pain in the right shoulder as constant throbbing and aching pain. It does wake her at nighttime. Seen today for evaluation.   Incidental note that about 3 months ago she had fallen backwards when she was out with her dog landing on the steps and mainly coming across her lumbar spine at the steps. She really does not remember any particular injury but may have had one to the shoulders at that time.  She had an MRI scan that reveal acromioclavicular joint degenerative changes with type 1 or 2 acromion.  She has mild to moderate rotator cuff tendinopathy, but no partial or full thickness tear.  The long head of the biceps was intact.  The labrum was intact.  She also has by exam adhesive capsulitis.  Scheduled for Arthroscopic SAD, DCR, Manipulation of the Left Shoulder      PAST MEDICAL HISTORY: Patient Active Problem  List  Diagnoses Date Noted  . OBESITY, UNSPECIFIED 12/15/2010  . SWELLING OF LIMB 12/15/2010  . WEIGHT GAIN 12/15/2010  . LEUKOCYTOSIS 10/20/2010  . CHRONIC DIASTOLIC HEART FAILURE 02/10/2010  . ANXIETY 02/09/2010  . DYSPNEA/SHORTNESS OF BREATH 12/26/2009  . BRONCHITIS, ACUTE WITH MILD BRONCHOSPASM 12/22/2009  . DRUG ABUSE, HX OF 12/22/2009  . LACERATION OF FINGER 07/01/2009  . BRONCHITIS, CHRONIC 05/05/2009  . ABDOMINAL PAIN, ACUTE 04/18/2009  . NARCOTIC ABUSE 12/18/2008  . NAUSEA AND VOMITING 11/19/2008  . DIARRHEA 11/19/2008  . DIVERTICULOSIS-COLON 10/07/2008  . PERSONAL HX COLONIC POLYPS 10/07/2008  . PERSONAL HISTORY OF FAILED MODERATE SEDATION 10/07/2008  . IBS 09/30/2008  . ABDOMINAL PAIN, GENERALIZED, CHRONIC 09/30/2008  . COPD 02/22/2008  . FATIGUE 02/22/2008  . HYPERTENSION 09/07/2007  . DIABETES MELLITUS, TYPE II 08/09/2007  . HYPERLIPIDEMIA 08/09/2007  . ABUSE, ALCOHOL, IN REMISSION 08/09/2007  . TOBACCO USE 08/09/2007  . DEPRESSION 08/09/2007  . ALLERGIC RHINITIS 08/09/2007  . GERD 08/09/2007  . SEIZURE DISORDER 08/09/2007  . SYMPTOM, COUGH 08/09/2007  . INCREASED BLOOD PRESSURE 08/09/2007  . DIVERTICULITIS, HX OF 08/09/2007   Past Medical History  Diagnosis Date  . DIABETES MELLITUS, TYPE II 08/09/2007  . HYPERLIPIDEMIA 08/09/2007  . LEUKOCYTOSIS 10/20/2010  . ANXIETY 02/09/2010  . ABUSE, ALCOHOL, IN REMISSION 08/09/2007  . TOBACCO USE 08/09/2007  . NARCOTIC ABUSE 12/18/2008  . DEPRESSION 08/09/2007  . HYPERTENSION 09/07/2007  . Chronic diastolic heart failure 02/10/2010  . BRONCHITIS, CHRONIC  05/05/2009  . COPD 02/22/2008  . GERD 08/09/2007  . DIVERTICULOSIS-COLON 10/07/2008  . IBS 09/30/2008  . SEIZURE DISORDER 08/09/2007  . ABDOMINAL PAIN, GENERALIZED, CHRONIC 09/30/2008  . INCREASED BLOOD PRESSURE 08/09/2007  . PERSONAL HX COLONIC POLYPS 10/07/2008  . DIVERTICULITIS, HX OF 08/09/2007  . Personal history of failed moderate sedation 10/07/2008  . DRUG ABUSE,  HX OF 12/22/2009   Past Surgical History  Procedure Date  . Appendectomy   . Abdominal hysterectomy   . Cholecystectomy   . Hemorrhoid surgery      MEDICATIONS:   No prescriptions prior to admission    ALLERGIES:   Allergies  Allergen Reactions  . Other     All Narcotics- Dependency Alcohol based products  . Penicillins Other (See Comments)    unknown    REVIEW OF SYSTEMS:  14 point review unremarkable except for noted in HPI & PMH.  FAMILY HISTORY:  No family history on file.  SOCIAL HISTORY:   History  Substance Use Topics  . Smoking status: Current Everyday Smoker -- 0.2 packs/day  . Smokeless tobacco: Not on file  . Alcohol Use: No     EXAMINATION:  Vital signs in last 24 hours:    General Appearance: Alert, cooperative, no distress, appears stated age    Head: Normocephalic, without obvious abnormality, atraumatic    Eyes: PERRL, conjunctiva/corneas clear, EOM's intact    Ears: Normal TM's and external ear canals, both ears    Nose:Nares normal, septum midline, mucosa normal    Throat:  Lips, mucosa, and tongue normal  ;  Neck: Neck:  Supple, symmetrical, trachea midline, no adenopathy  Lungs: Clear to auscultation bilaterally, respirations unlabored   Heart: Regular rate and rhythm, S1 and S2 normal, no murmur, rub or gallop   Abdomen:  Soft, non-tender, bowels sounds present  Genitalia, Breasts, Rectal: Not indicated for Orthopaedic exam  CNS:  Oriented X 3, Cranial nerves II- XII grossly intact   Musculoskeletal:  continues to have only about 100 degrees of forward flexion of the left shoulder with about 70 degrees of abduction before she has significant pain. She is limited with internal and external rotation to only about 20-30 degrees because of pain. She is diffusely weak in the left shoulder. She is neurovascularly intact distally.   Assessment/Plan: Left Shoulder Impingement, AC OA, Aand arthrofibrosis   Past Medical History    Diagnosis Date  . DIABETES MELLITUS, TYPE II 08/09/2007  . HYPERLIPIDEMIA 08/09/2007  . LEUKOCYTOSIS 10/20/2010  . ANXIETY 02/09/2010  . ABUSE, ALCOHOL, IN REMISSION 08/09/2007  . TOBACCO USE 08/09/2007  . NARCOTIC ABUSE 12/18/2008  . DEPRESSION 08/09/2007  . HYPERTENSION 09/07/2007  . Chronic diastolic heart failure 02/10/2010  . BRONCHITIS, CHRONIC 05/05/2009  . COPD 02/22/2008  . GERD 08/09/2007  . DIVERTICULOSIS-COLON 10/07/2008  . IBS 09/30/2008  . SEIZURE DISORDER 08/09/2007  . ABDOMINAL PAIN, GENERALIZED, CHRONIC 09/30/2008  . INCREASED BLOOD PRESSURE 08/09/2007  . PERSONAL HX COLONIC POLYPS 10/07/2008  . DIVERTICULITIS, HX OF 08/09/2007  . Personal history of failed moderate sedation 10/07/2008  . DRUG ABUSE, HX OF 12/22/2009    Plan for Left shoulder Arthroscopic SAD, DCR, and manipulation  PETRARCA,BRIAN 12/25/2011, 1:59 PM

## 2011-12-16 ENCOUNTER — Encounter (HOSPITAL_COMMUNITY): Payer: Self-pay | Admitting: Pharmacy Technician

## 2011-12-22 NOTE — Pre-Procedure Instructions (Signed)
20 Joyce Richardson  12/22/2011   Your procedure is scheduled on:FEB 5Report to [facility] [id number] AM.  Call this number if you have problems the morning of surgery: [phone]   Remember:   Do not eat food:After Midnight.  May have clear liquids: up to 4 Hours before arrival.  Clear liquids include soda, tea, black coffee, apple or grape juice, broth.  Take these medicines the morning of surgery with A SIP OF WATER:EFFEXOR,SPIRIVA,PRILOSEC,KEPPRA,PREMARIN,INHALERS AS NEEDED  Do not wear jewelry, make-up or nail polish.  Do not wear lotions, powders, or perfumes. You may wear deodorant.  Do not shave 48 hours prior to surgery.  Do not bring valuables to the hospital.  Contacts, dentures or bridgework may not be worn into surgery.  Leave suitcase in the car. After surgery it may be brought to your room.  For patients admitted to the hospital, checkout time is 11:00 AM the day of discharge.   Patients discharged the day of surgery will not be allowed to drive home.  Name and phone number of your driver:FAMILY Special Instructions: CHG Shower Use Special Wash: 1/2 bottle night before surgery and 1/2 bottle morning of surgery.   Please read over the following fact sheets that you were given: Pain Booklet, MRSA Information and Surgical Site Infection Prevention

## 2011-12-23 ENCOUNTER — Other Ambulatory Visit: Payer: Self-pay

## 2011-12-23 ENCOUNTER — Encounter (HOSPITAL_COMMUNITY)
Admission: RE | Admit: 2011-12-23 | Discharge: 2011-12-23 | Disposition: A | Discharge status: Home / Self Care | Payer: Medicare Other | Source: Ambulatory Visit | Attending: Orthopaedic Surgery | Admitting: Orthopaedic Surgery

## 2011-12-23 ENCOUNTER — Encounter (HOSPITAL_COMMUNITY): Payer: Self-pay

## 2011-12-23 ENCOUNTER — Encounter (HOSPITAL_COMMUNITY)
Admission: RE | Admit: 2011-12-23 | Discharge: 2011-12-23 | Disposition: A | Discharge status: Home / Self Care | Payer: Medicare Other | Source: Ambulatory Visit | Attending: Anesthesiology | Admitting: Anesthesiology

## 2011-12-23 LAB — COMPREHENSIVE METABOLIC PANEL
ALT: 13 U/L (ref 0–35)
Albumin: 3 g/dL — ABNORMAL LOW (ref 3.5–5.2)
Alkaline Phosphatase: 96 U/L (ref 39–117)
Calcium: 8.9 mg/dL (ref 8.4–10.5)
GFR calc Af Amer: >90 mL/min (ref >90)
Potassium: 4.1 mEq/L (ref 3.5–5.1)
Sodium: 138 mEq/L (ref 135–145)
Total Protein: 7.1 g/dL (ref 6.0–8.3)

## 2011-12-23 LAB — CBC
HCT: 41.5 % (ref 36.0–46.0)
Hemoglobin: 12.9 g/dL (ref 12.0–15.0)
MCHC: 31.1 g/dL (ref 30.0–36.0)
MCV: 83.8 fL (ref 78.0–100.0)
RDW: 15.6 % — ABNORMAL HIGH (ref 11.5–15.5)

## 2011-12-23 MED ORDER — CHLORHEXIDINE GLUCONATE 4 % EX LIQD: 60.0000 mL | Freq: Once | CUTANEOUS | Status: DC

## 2011-12-23 NOTE — Progress Notes (Signed)
ANESTHESIA PLEASE REVIEW CHART.EVALUATED BY DR [facility] 09/2011

## 2011-12-24 NOTE — Consult Note (Addendum)
Anesthesia:  Patient is a 66 year old female scheduled for left shoulder subacromial decompression, distal clavicle resection on 12/28/11.  History includes HLD, HTN, DM2, COPD (mild obstruction per Cardiology notes), smoking.  Her Cardiologist is Dr. Shirlee Latch who saw her last on 10/12/11.  Labs noted.  CXR on 12/23/11 shows: 1. Emphysema with bibasilar scarring. Difficult to exclude  superimposed airspace disease at the right lung base.  2. Mild compression of an upper thoracic vertebral body, age  Indeterminate. Clinical correlation recommended. WBC elevated at 15.4.  She was afebrile at PAT. Sats 93%.  I'll attempt call patient to clinically correlate on 12/27/11.  EKG on 12/23/11 showed NSR, low voltage, nonspecific T wave abnormality.  Echo on 02/06/10 showed: - Left ventricle: The cavity size was normal. Wall thickness was normal. Systolic function was normal. The estimated ejection fraction was in the range of 55% to 60%. Wall motion was normal; there were no regional wall motion abnormalities. Features are consistent with a pseudonormal left ventricular filling pattern, with concomitant abnormal relaxation and increased filling pressure (grade 2 diastolic dysfunction). - Left atrium: The atrium was mildly dilated. - Pericardium, extracardiac: A trivial pericardial effusion was identified.  Stress test on 02/17/10 was felt to be "probably normal study", low risk scan, EF 68%.  Addendum: 12/27/11 1330  I called and spoke with Ms. Partch.  She does report intermittent wheezing for at least 3 months.  She did notify her PCP Dr. Evlyn Kanner and, according to her, was told he thought it may be related to "protecting" her shoulder more since the pain has worsened over the past six months.  She is not wheezing today.  She denies fever, or persistent cough.  She has not recently been sick with respiratory or sinus congestion.  She last had PNA 5 year ago.  She reports that her sats usually run around 91%.   Overall, she feels at her baseline.  She denies CP.  I updated Anesthesiologist regarding above.  She will be evaluated by her Anesthesiologist on the day of surgery, if no worrisome or acute respiratory symptoms then plan to proceed.

## 2011-12-28 ENCOUNTER — Ambulatory Visit (HOSPITAL_COMMUNITY): Payer: Medicare Other | Admitting: Vascular Surgery

## 2011-12-28 ENCOUNTER — Encounter (HOSPITAL_COMMUNITY)
Admission: RE | Disposition: A | Discharge status: Home / Self Care | Payer: Self-pay | Source: Ambulatory Visit | Attending: Orthopaedic Surgery

## 2011-12-28 ENCOUNTER — Encounter (HOSPITAL_COMMUNITY): Payer: Self-pay | Admitting: *Deleted

## 2011-12-28 ENCOUNTER — Encounter (HOSPITAL_COMMUNITY): Payer: Self-pay | Admitting: Vascular Surgery

## 2011-12-28 ENCOUNTER — Ambulatory Visit (HOSPITAL_COMMUNITY)
Admission: RE | Admit: 2011-12-28 | Discharge: 2011-12-29 | Disposition: A | Discharge status: Home / Self Care | Payer: Medicare Other | Source: Ambulatory Visit | Attending: Orthopaedic Surgery | Admitting: Orthopaedic Surgery

## 2011-12-28 DIAGNOSIS — F17200 Nicotine dependence, unspecified, uncomplicated: Secondary | ICD-10-CM | POA: Insufficient documentation

## 2011-12-28 DIAGNOSIS — K08109 Complete loss of teeth, unspecified cause, unspecified class: Secondary | ICD-10-CM | POA: Insufficient documentation

## 2011-12-28 DIAGNOSIS — J4489 Other specified chronic obstructive pulmonary disease: Secondary | ICD-10-CM | POA: Insufficient documentation

## 2011-12-28 DIAGNOSIS — I5032 Chronic diastolic (congestive) heart failure: Secondary | ICD-10-CM

## 2011-12-28 DIAGNOSIS — I10 Essential (primary) hypertension: Secondary | ICD-10-CM

## 2011-12-28 DIAGNOSIS — M24619 Ankylosis, unspecified shoulder: Secondary | ICD-10-CM | POA: Insufficient documentation

## 2011-12-28 DIAGNOSIS — M25819 Other specified joint disorders, unspecified shoulder: Secondary | ICD-10-CM | POA: Insufficient documentation

## 2011-12-28 DIAGNOSIS — E119 Type 2 diabetes mellitus without complications: Secondary | ICD-10-CM | POA: Insufficient documentation

## 2011-12-28 DIAGNOSIS — Z01810 Encounter for preprocedural cardiovascular examination: Secondary | ICD-10-CM | POA: Insufficient documentation

## 2011-12-28 DIAGNOSIS — M7540 Impingement syndrome of unspecified shoulder: Secondary | ICD-10-CM

## 2011-12-28 DIAGNOSIS — Z01812 Encounter for preprocedural laboratory examination: Secondary | ICD-10-CM | POA: Insufficient documentation

## 2011-12-28 DIAGNOSIS — J449 Chronic obstructive pulmonary disease, unspecified: Secondary | ICD-10-CM | POA: Insufficient documentation

## 2011-12-28 DIAGNOSIS — Z01818 Encounter for other preprocedural examination: Secondary | ICD-10-CM | POA: Insufficient documentation

## 2011-12-28 DIAGNOSIS — R0989 Other specified symptoms and signs involving the circulatory and respiratory systems: Secondary | ICD-10-CM

## 2011-12-28 DIAGNOSIS — M19019 Primary osteoarthritis, unspecified shoulder: Secondary | ICD-10-CM | POA: Insufficient documentation

## 2011-12-28 DIAGNOSIS — Z794 Long term (current) use of insulin: Secondary | ICD-10-CM | POA: Insufficient documentation

## 2011-12-28 DIAGNOSIS — M7500 Adhesive capsulitis of unspecified shoulder: Secondary | ICD-10-CM | POA: Insufficient documentation

## 2011-12-28 DIAGNOSIS — K219 Gastro-esophageal reflux disease without esophagitis: Secondary | ICD-10-CM | POA: Insufficient documentation

## 2011-12-28 DIAGNOSIS — M75110 Incomplete rotator cuff tear or rupture of unspecified shoulder, not specified as traumatic: Secondary | ICD-10-CM | POA: Insufficient documentation

## 2011-12-28 HISTORY — PX: SHOULDER ARTHROSCOPY W/ SUBACROMIAL DECOMPRESSION AND DISTAL CLAVICLE EXCISION: SHX2401

## 2011-12-28 LAB — GLUCOSE, CAPILLARY
Glucose-Capillary: 227 mg/dL — ABNORMAL HIGH (ref 70–99)
Glucose-Capillary: 154 mg/dL — ABNORMAL HIGH (ref 70–99)

## 2011-12-28 SURGERY — SHOULDER ARTHROSCOPY WITH DISTAL CLAVICLE RESECTION
Anesthesia: General | Site: Shoulder | Laterality: Left | Wound class: Clean

## 2011-12-28 MED ORDER — QUETIAPINE FUMARATE 200 MG PO TABS
200.0000 mg | ORAL_TABLET | Freq: Every day | ORAL | Status: DC
  Administered 2011-12-28: 200 mg via ORAL
  Filled 2011-12-28 (×2): qty 1

## 2011-12-28 MED ORDER — ONDANSETRON HCL 4 MG/2ML IJ SOLN
INTRAMUSCULAR | Status: DC | PRN
  Administered 2011-12-28: 4 mg via INTRAVENOUS

## 2011-12-28 MED ORDER — ROCURONIUM BROMIDE 100 MG/10ML IV SOLN
INTRAVENOUS | Status: DC | PRN
  Administered 2011-12-28: 30 mg via INTRAVENOUS

## 2011-12-28 MED ORDER — BUDESONIDE-FORMOTEROL FUMARATE 160-4.5 MCG/ACT IN AERO: 2.0000 | INHALATION_SPRAY | Freq: Every day | RESPIRATORY_TRACT | Status: DC | PRN

## 2011-12-28 MED ORDER — DROPERIDOL 2.5 MG/ML IJ SOLN: 0.6250 mg | INTRAMUSCULAR | Status: DC | PRN

## 2011-12-28 MED ORDER — VANCOMYCIN HCL 1000 MG IV SOLR
1000.0000 mg | INTRAVENOUS | Status: DC | PRN
  Administered 2011-12-28: 1000 mg via INTRAVENOUS

## 2011-12-28 MED ORDER — LEVETIRACETAM 250 MG PO TABS
250.0000 mg | ORAL_TABLET | Freq: Two times a day (BID) | ORAL | Status: DC
  Administered 2011-12-28: 250 mg via ORAL
  Filled 2011-12-28 (×3): qty 1

## 2011-12-28 MED ORDER — ESTROGENS CONJUGATED 0.625 MG PO TABS
0.6250 mg | ORAL_TABLET | Freq: Every day | ORAL | Status: DC
  Filled 2011-12-28 (×2): qty 1

## 2011-12-28 MED ORDER — INSULIN GLARGINE 100 UNIT/ML ~~LOC~~ SOLN
30.0000 [IU] | Freq: Every day | SUBCUTANEOUS | Status: DC
  Administered 2011-12-28: 30 [IU] via SUBCUTANEOUS
  Filled 2011-12-28: qty 3

## 2011-12-28 MED ORDER — METOCLOPRAMIDE HCL 5 MG/ML IJ SOLN
5.0000 mg | Freq: Three times a day (TID) | INTRAMUSCULAR | Status: DC | PRN
  Filled 2011-12-28: qty 2

## 2011-12-28 MED ORDER — PANTOPRAZOLE SODIUM 40 MG PO TBEC
40.0000 mg | DELAYED_RELEASE_TABLET | Freq: Every day | ORAL | Status: DC
  Filled 2011-12-28: qty 1

## 2011-12-28 MED ORDER — VANCOMYCIN HCL IN DEXTROSE 1-5 GM/200ML-% IV SOLN
1000.0000 mg | Freq: Once | INTRAVENOUS | Status: AC
  Administered 2011-12-29: 1000 mg via INTRAVENOUS
  Filled 2011-12-28: qty 200

## 2011-12-28 MED ORDER — ONDANSETRON HCL 4 MG/2ML IJ SOLN: 4.0000 mg | Freq: Four times a day (QID) | INTRAMUSCULAR | Status: DC | PRN

## 2011-12-28 MED ORDER — SODIUM CHLORIDE 0.9 % IR SOLN
Status: DC | PRN
  Administered 2011-12-28: 1000 mL

## 2011-12-28 MED ORDER — HYDROMORPHONE HCL PF 1 MG/ML IJ SOLN
0.2500 mg | INTRAMUSCULAR | Status: DC | PRN
  Administered 2011-12-28 (×3): 0.5 mg via INTRAVENOUS

## 2011-12-28 MED ORDER — METOCLOPRAMIDE HCL 5 MG PO TABS
5.0000 mg | ORAL_TABLET | Freq: Three times a day (TID) | ORAL | Status: DC | PRN
  Filled 2011-12-28: qty 2

## 2011-12-28 MED ORDER — FENTANYL CITRATE 0.05 MG/ML IJ SOLN
INTRAMUSCULAR | Status: AC
  Filled 2011-12-28: qty 2

## 2011-12-28 MED ORDER — POTASSIUM CHLORIDE CRYS ER 10 MEQ PO TBCR
10.0000 meq | EXTENDED_RELEASE_TABLET | Freq: Every day | ORAL | Status: DC
  Filled 2011-12-28 (×2): qty 1

## 2011-12-28 MED ORDER — METHOCARBAMOL 100 MG/ML IJ SOLN
500.0000 mg | Freq: Four times a day (QID) | INTRAVENOUS | Status: DC | PRN
  Filled 2011-12-28: qty 5

## 2011-12-28 MED ORDER — ASPIRIN EC 81 MG PO TBEC
81.0000 mg | DELAYED_RELEASE_TABLET | Freq: Every day | ORAL | Status: DC
  Administered 2011-12-28: 81 mg via ORAL
  Filled 2011-12-28 (×2): qty 1

## 2011-12-28 MED ORDER — ONDANSETRON HCL 4 MG PO TABS: 4.0000 mg | ORAL_TABLET | Freq: Four times a day (QID) | ORAL | Status: DC | PRN

## 2011-12-28 MED ORDER — SODIUM CHLORIDE 0.9 % IV SOLN
INTRAVENOUS | Status: DC
  Administered 2011-12-28 – 2011-12-29 (×2): via INTRAVENOUS

## 2011-12-28 MED ORDER — LACTATED RINGERS IV SOLN
INTRAVENOUS | Status: DC | PRN
  Administered 2011-12-28: 15:00:00 via INTRAVENOUS

## 2011-12-28 MED ORDER — MIDAZOLAM HCL 2 MG/2ML IJ SOLN: 1.0000 mg | INTRAMUSCULAR | Status: DC | PRN

## 2011-12-28 MED ORDER — NEOSTIGMINE METHYLSULFATE 1 MG/ML IJ SOLN
INTRAMUSCULAR | Status: DC | PRN
  Administered 2011-12-28: 2 mg via INTRAVENOUS
  Administered 2011-12-28: 2.5 mg via INTRAVENOUS

## 2011-12-28 MED ORDER — VANCOMYCIN HCL IN DEXTROSE 1-5 GM/200ML-% IV SOLN
INTRAVENOUS | Status: AC
  Filled 2011-12-28: qty 200

## 2011-12-28 MED ORDER — ONDANSETRON HCL 4 MG PO TABS: 4.0000 mg | ORAL_TABLET | Freq: Three times a day (TID) | ORAL | Status: DC | PRN

## 2011-12-28 MED ORDER — VENLAFAXINE HCL ER 150 MG PO CP24: 150.0000 mg | ORAL_CAPSULE | Freq: Two times a day (BID) | ORAL | Status: DC

## 2011-12-28 MED ORDER — PNEUMOCOCCAL VAC POLYVALENT 25 MCG/0.5ML IJ INJ
0.5000 mL | INJECTION | INTRAMUSCULAR | Status: DC
  Filled 2011-12-28: qty 0.5

## 2011-12-28 MED ORDER — KETOROLAC TROMETHAMINE 30 MG/ML IJ SOLN
30.0000 mg | Freq: Four times a day (QID) | INTRAMUSCULAR | Status: AC
  Administered 2011-12-28 – 2011-12-29 (×2): 30 mg via INTRAVENOUS
  Filled 2011-12-28 (×2): qty 1

## 2011-12-28 MED ORDER — TIOTROPIUM BROMIDE MONOHYDRATE 18 MCG IN CAPS
18.0000 ug | ORAL_CAPSULE | Freq: Every day | RESPIRATORY_TRACT | Status: DC
  Administered 2011-12-29: 18 ug via RESPIRATORY_TRACT
  Filled 2011-12-28: qty 5

## 2011-12-28 MED ORDER — OXYCODONE HCL 5 MG PO TABS
5.0000 mg | ORAL_TABLET | ORAL | Status: DC | PRN
  Administered 2011-12-29: 10 mg via ORAL
  Filled 2011-12-28: qty 2

## 2011-12-28 MED ORDER — LOSARTAN POTASSIUM 25 MG PO TABS
25.0000 mg | ORAL_TABLET | Freq: Every day | ORAL | Status: DC
  Filled 2011-12-28 (×2): qty 1

## 2011-12-28 MED ORDER — ACETAMINOPHEN 10 MG/ML IV SOLN
1000.0000 mg | Freq: Once | INTRAVENOUS | Status: AC
  Administered 2011-12-28: 1000 mg via INTRAVENOUS
  Filled 2011-12-28: qty 100

## 2011-12-28 MED ORDER — HYDROMORPHONE HCL PF 1 MG/ML IJ SOLN: 0.5000 mg | INTRAMUSCULAR | Status: DC | PRN

## 2011-12-28 MED ORDER — FUROSEMIDE 20 MG PO TABS
20.0000 mg | ORAL_TABLET | Freq: Every day | ORAL | Status: DC
  Filled 2011-12-28 (×2): qty 1

## 2011-12-28 MED ORDER — ALBUTEROL SULFATE HFA 108 (90 BASE) MCG/ACT IN AERS
1.0000 | INHALATION_SPRAY | RESPIRATORY_TRACT | Status: DC | PRN
  Filled 2011-12-28: qty 6.7

## 2011-12-28 MED ORDER — PROPOFOL 10 MG/ML IV EMUL
INTRAVENOUS | Status: DC | PRN
  Administered 2011-12-28: 180 mg via INTRAVENOUS
  Administered 2011-12-28: 20 mg via INTRAVENOUS

## 2011-12-28 MED ORDER — BUPIVACAINE-EPINEPHRINE PF 0.5-1:200000 % IJ SOLN
INTRAMUSCULAR | Status: DC | PRN
  Administered 2011-12-28: 30 mL

## 2011-12-28 MED ORDER — FENTANYL CITRATE 0.05 MG/ML IJ SOLN: 50.0000 ug | INTRAMUSCULAR | Status: DC | PRN

## 2011-12-28 MED ORDER — INSULIN ASPART 100 UNIT/ML ~~LOC~~ SOLN
0.0000 [IU] | Freq: Three times a day (TID) | SUBCUTANEOUS | Status: DC
  Filled 2011-12-28: qty 3

## 2011-12-28 MED ORDER — FENTANYL CITRATE 0.05 MG/ML IJ SOLN
INTRAMUSCULAR | Status: DC | PRN
  Administered 2011-12-28 (×2): 50 ug via INTRAVENOUS
  Administered 2011-12-28 (×2): 100 ug via INTRAVENOUS

## 2011-12-28 MED ORDER — LACTATED RINGERS IV SOLN
INTRAVENOUS | Status: DC
  Administered 2011-12-28: 15:00:00 via INTRAVENOUS

## 2011-12-28 MED ORDER — VENLAFAXINE HCL 75 MG PO TABS
150.0000 mg | ORAL_TABLET | Freq: Two times a day (BID) | ORAL | Status: DC
  Administered 2011-12-29: 150 mg via ORAL
  Filled 2011-12-28 (×4): qty 2

## 2011-12-28 MED ORDER — EXENATIDE 10 MCG/0.04ML ~~LOC~~ SOPN
10.0000 ug | PEN_INJECTOR | Freq: Two times a day (BID) | SUBCUTANEOUS | Status: DC
  Filled 2011-12-28 (×3): qty 0.04

## 2011-12-28 MED ORDER — METHOCARBAMOL 100 MG/ML IJ SOLN
500.0000 mg | INTRAVENOUS | Status: AC
  Administered 2011-12-28: 500 mg via INTRAVENOUS
  Filled 2011-12-28: qty 5

## 2011-12-28 MED ORDER — METHOCARBAMOL 500 MG PO TABS: 500.0000 mg | ORAL_TABLET | Freq: Four times a day (QID) | ORAL | Status: DC | PRN

## 2011-12-28 MED ORDER — ACETAMINOPHEN 10 MG/ML IV SOLN
INTRAVENOUS | Status: AC
  Filled 2011-12-28: qty 100

## 2011-12-28 MED ORDER — ACETAMINOPHEN 10 MG/ML IV SOLN
1000.0000 mg | Freq: Four times a day (QID) | INTRAVENOUS | Status: DC
  Administered 2011-12-28 – 2011-12-29 (×2): 1000 mg via INTRAVENOUS
  Filled 2011-12-28 (×4): qty 100

## 2011-12-28 MED ORDER — MIDAZOLAM HCL 2 MG/2ML IJ SOLN
INTRAMUSCULAR | Status: AC
  Filled 2011-12-28: qty 2

## 2011-12-28 MED ORDER — BUPIVACAINE-EPINEPHRINE 0.25% -1:200000 IJ SOLN
INTRAMUSCULAR | Status: DC | PRN
  Administered 2011-12-28: 30 mL

## 2011-12-28 MED ORDER — GLYCOPYRROLATE 0.2 MG/ML IJ SOLN
INTRAMUSCULAR | Status: DC | PRN
  Administered 2011-12-28: .5 mg via INTRAVENOUS

## 2011-12-28 MED ORDER — SODIUM CHLORIDE 0.9 % IV SOLN: INTRAVENOUS | Status: DC

## 2011-12-28 MED ORDER — MIDAZOLAM HCL 5 MG/5ML IJ SOLN
INTRAMUSCULAR | Status: DC | PRN
  Administered 2011-12-28: 2 mg via INTRAVENOUS

## 2011-12-28 MED ORDER — PREGABALIN 75 MG PO CAPS
600.0000 mg | ORAL_CAPSULE | Freq: Every day | ORAL | Status: DC
Start: 2011-12-28 — End: 2011-12-29

## 2011-12-28 MED ORDER — HYDROMORPHONE HCL PF 1 MG/ML IJ SOLN
INTRAMUSCULAR | Status: AC
  Filled 2011-12-28: qty 1

## 2011-12-28 SURGICAL SUPPLY — 38 items
BLADE GREAT WHITE 4.2 (BLADE) ×2 IMPLANT
BUR OVAL 4.0 (BURR) IMPLANT
BUR OVAL 6.0 (BURR) ×2 IMPLANT
CANNULA ACUFLEX KIT 5X76 (CANNULA) ×2 IMPLANT
CLOTH BEACON ORANGE TIMEOUT ST (SAFETY) ×2 IMPLANT
DRAPE SHOULDER BEACH CHAIR (DRAPES) IMPLANT
DRAPE STERI 35X30 U-POUCH (DRAPES) ×2 IMPLANT
DRSG EMULSION OIL 3X3 NADH (GAUZE/BANDAGES/DRESSINGS) ×2 IMPLANT
DRSG PAD ABDOMINAL 8X10 ST (GAUZE/BANDAGES/DRESSINGS) ×2 IMPLANT
DURAPREP 26ML APPLICATOR (WOUND CARE) ×2 IMPLANT
ELECT MENISCUS 165MM 90D (ELECTRODE) IMPLANT
GLOVE BIOGEL PI IND STRL 8 (GLOVE) ×1 IMPLANT
GLOVE BIOGEL PI IND STRL 8.5 (GLOVE) ×1 IMPLANT
GLOVE BIOGEL PI INDICATOR 8 (GLOVE) ×1
GLOVE BIOGEL PI INDICATOR 8.5 (GLOVE) ×1
GLOVE ECLIPSE 8.0 STRL XLNG CF (GLOVE) ×2 IMPLANT
GLOVE SURG ORTHO 8.5 STRL (GLOVE) ×2 IMPLANT
GOWN PREVENTION PLUS XLARGE (GOWN DISPOSABLE) ×2 IMPLANT
GOWN STRL NON-REIN LRG LVL3 (GOWN DISPOSABLE) ×4 IMPLANT
KIT ROOM TURNOVER OR (KITS) ×2 IMPLANT
MANIFOLD NEPTUNE II (INSTRUMENTS) ×2 IMPLANT
NEEDLE 22X1 1/2 (OR ONLY) (NEEDLE) ×2 IMPLANT
NEEDLE SPNL 18GX3.5 QUINCKE PK (NEEDLE) ×2 IMPLANT
NS IRRIG 1000ML POUR BTL (IV SOLUTION) ×2 IMPLANT
PACK SHOULDER (CUSTOM PROCEDURE TRAY) ×2 IMPLANT
PAD ARMBOARD 7.5X6 YLW CONV (MISCELLANEOUS) ×4 IMPLANT
SET ARTHROSCOPY TUBING (MISCELLANEOUS) ×1
SET ARTHROSCOPY TUBING LN (MISCELLANEOUS) ×1 IMPLANT
SPONGE GAUZE 4X4 12PLY (GAUZE/BANDAGES/DRESSINGS) ×2 IMPLANT
SPONGE GAUZE 4X4 STERILE 39 (GAUZE/BANDAGES/DRESSINGS) ×2 IMPLANT
SUT ETHILON 4 0 PS 2 18 (SUTURE) ×2 IMPLANT
SYR 20ML ECCENTRIC (SYRINGE) ×2 IMPLANT
SYR CONTROL 10ML LL (SYRINGE) ×2 IMPLANT
TAPE CLOTH SURG 4X10 WHT LF (GAUZE/BANDAGES/DRESSINGS) ×2 IMPLANT
TOWEL OR 17X24 6PK STRL BLUE (TOWEL DISPOSABLE) ×2 IMPLANT
TOWEL OR 17X26 10 PK STRL BLUE (TOWEL DISPOSABLE) ×2 IMPLANT
WAND 90 DEG TURBOVAC W/CORD (SURGICAL WAND) ×2 IMPLANT
WATER STERILE IRR 1000ML POUR (IV SOLUTION) ×2 IMPLANT

## 2011-12-28 NOTE — Anesthesia Preprocedure Evaluation (Signed)
Anesthesia Evaluation  Patient identified by MRN, date of birth, ID band Patient awake    Reviewed: Allergy & Precautions, H&P , NPO status , Patient's Chart, lab work & pertinent test results  History of Anesthesia Complications Negative for: history of anesthetic complications  Airway Mallampati: II TM Distance: >3 FB Neck ROM: Full    Dental  (+) Edentulous Upper and Edentulous Lower   Pulmonary asthma , COPD (uses inhalers dialy) COPD inhaler, Current Smoker,  clear to auscultation  Pulmonary exam normal       Cardiovascular hypertension, Pt. on medications Regular Normal ECHO '11  EF 55-60%   Neuro/Psych Anxiety Depression H/o substance abuse: ETOH, narcotics   GI/Hepatic Neg liver ROS, GERD-  Medicated and Controlled,  Endo/Other  Diabetes mellitus-, Type 2, Insulin Dependent and Oral Hypoglycemic AgentsMorbid obesityGlu 123  Renal/GU negative Renal ROS     Musculoskeletal   Abdominal (+) obese,   Peds  Hematology   Anesthesia Other Findings   Reproductive/Obstetrics                           Anesthesia Physical Anesthesia Plan  ASA: III  Anesthesia Plan: General   Post-op Pain Management:    Induction: Intravenous  Airway Management Planned: Oral ETT  Additional Equipment:   Intra-op Plan:   Post-operative Plan: Extubation in OR  Informed Consent: I have reviewed the patients History and Physical, chart, labs and discussed the procedure including the risks, benefits and alternatives for the proposed anesthesia with the patient or authorized representative who has indicated his/her understanding and acceptance.     Plan Discussed with: CRNA and Surgeon  Anesthesia Plan Comments: (Plan routine monitors, GETA with interscalene block for post op analgesia  )        Anesthesia Quick Evaluation

## 2011-12-28 NOTE — H&P (Signed)
There has been no change in health status since  the current H&P.I have examined the patient and discussed the surgery. No contraindications to the planned procedure exist.    There has been no change in health status since  the current H&P.I have examined the patient and discussed the surgery. No contraindications to the planned procedure exist.

## 2011-12-28 NOTE — Anesthesia Postprocedure Evaluation (Signed)
Anesthesia Post Note  Patient: Joyce Richardson  Procedure(s) Performed:  SHOULDER ARTHROSCOPY WITH DISTAL CLAVICLE RESECTION - with subacromial decompression and manipulation  Anesthesia type: general  Patient location: PACU  Post pain: Pain level controlled  Post assessment: Patient's Cardiovascular Status Stable  Last Vitals:  Filed Vitals:   12/28/11 1645  BP:   Pulse:   Temp: 36.1 C  Resp:     Post vital signs: Reviewed and stable  Level of consciousness: sedated  Complications: No apparent anesthesia complications

## 2011-12-28 NOTE — Preoperative (Signed)
Beta Blockers   Reason not to administer Beta Blockers:Not Applicable 

## 2011-12-28 NOTE — Brief Op Note (Addendum)
12/28/2011  4:15 PM  PATIENT:  Joyce Richardson  66 y.o. female  PRE-OPERATIVE DIAGNOSIS:  AC ARTHRITIS, IMPINGEMENT, ADHESIVE CAPULITIS, LEFT SHOULDER  POST-OPERATIVE DIAGNOSIS:  AC ARTHRITIS, IMPINGEMENT, ADHESIVE CAPULITIS ,PARTIAL ROTATOR CUFF TEAR LEFT SHOULDER  PROCEDURE:  Procedure(s): SHOULDER ARTHROSCOPY WITH DISTAL CLAVICLE RESECTION, SAD,DEBRIDEMENT OF PARTIAL ROTATOR CUFF TEAR, MANIPULATION  SURGEON:  Surgeon(s): Valeria Batman, MD  PHYSICIAN ASSISTANT: B rian Petrarca,PAC  ASSISTANTS: none   ANESTHESIA:   regional and general  EBL:     BLOOD ADMINISTERED:none  DRAINS: none   LOCAL MEDICATIONS USED:  MARCAINE 10CC  SPECIMEN:  No Specimen  DISPOSITION OF SPECIMEN:  N/A  COUNTS:  YES  TOURNIQUET:  * No tourniquets in log *  DICTATION: .dictation number [id number]  PLAN OF CARE: Admit for overnight observation  PATIENT DISPOSITION:  PACU - hemodynamically stable.   Delay start of Pharmacological VTE agent (>24hrs) due to surgical blood loss or risk of bleeding:  {YES/NO/NOT APPLICABLE:20182

## 2011-12-28 NOTE — Anesthesia Procedure Notes (Addendum)
Anesthesia Regional Block:  Interscalene brachial plexus block  Pre-Anesthetic Checklist: ,, timeout performed, Correct Patient, Correct Site, Correct Laterality, Correct Procedure, Correct Position, site marked, Risks and benefits discussed,  Surgical consent,  Pre-op evaluation,  At surgeon's request and post-op pain management  Laterality: Left  Prep: chloraprep       Needles:  Injection technique: Single-shot  Needle Type: Stimulator Needle - 40      Needle Gauge: 22 and 22 G    Additional Needles:  Procedures: nerve stimulator Interscalene brachial plexus block  Nerve Stimulator or Paresthesia:  Response: forearm twitch, 0.5 mA, 0.1 ms,   Additional Responses:   Narrative:  Start time: 12/28/2011 3:01 PM End time: 12/28/2011 3:11 PM Injection made incrementally with aspirations every 5 mL.  Events: other event  Performed by: Personally  Anesthesiologist: Sandford Craze, MD  Additional Notes: Pt identified in Holding room.  Monitors applied. Working IV access confirmed. Sterile prep.  #22ga PNS to forearm twitch at 0.47mA threshold.  30cc 0.5% Bupivacaine with 1:200k epi injected incrementally after negative test dose.  Patient asymptomatic, VSS, no heme aspirated, tolerated well.   Sandford Craze, MD  Interscalene brachial plexus block Performed by: Caryn Bee    Procedure Name: Intubation Date/Time: 12/28/2011 3:25 PM Performed by: Caryn Bee Pre-anesthesia Checklist: Patient identified, Emergency Drugs available, Suction available, Patient being monitored and Timeout performed Patient Re-evaluated:Patient Re-evaluated prior to inductionOxygen Delivery Method: Circle System Utilized Preoxygenation: Pre-oxygenation with 100% oxygen Intubation Type: IV induction Ventilation: Oral airway inserted - appropriate to patient size and Mask ventilation without difficulty Laryngoscope Size: Mac and 3 Grade View: Grade I Tube size: 7.0 mm Number of attempts: 1 Airway  Equipment and Method: stylet Placement Confirmation: ETT inserted through vocal cords under direct vision,  positive ETCO2 and breath sounds checked- equal and bilateral Secured at: 21 cm Tube secured with: Tape Dental Injury: Teeth and Oropharynx as per pre-operative assessment

## 2011-12-28 NOTE — Transfer of Care (Signed)
Immediate Anesthesia Transfer of Care Note  Patient: Joyce Richardson  Procedure(s) Performed:  SHOULDER ARTHROSCOPY WITH DISTAL CLAVICLE RESECTION - with subacromial decompression and manipulation  Patient Location: PACU  Anesthesia Type: General  Level of Consciousness: awake and alert   Airway & Oxygen Therapy: Patient Spontanous Breathing and Patient connected to face mask oxygen  Post-op Assessment: Report given to PACU RN and Post -op Vital signs reviewed and stable  Post vital signs: Reviewed and stable  Complications: No apparent anesthesia complications

## 2011-12-29 LAB — HEMOGLOBIN A1C
Hgb A1c MFr Bld: 7.1 % — ABNORMAL HIGH (ref <5.7)
Mean Plasma Glucose: 157 mg/dL — ABNORMAL HIGH (ref <117)

## 2011-12-29 LAB — BASIC METABOLIC PANEL
GFR calc Af Amer: >90 mL/min (ref >90)
GFR calc non Af Amer: >90 mL/min (ref >90)
Potassium: 4.2 mEq/L (ref 3.5–5.1)
Sodium: 137 mEq/L (ref 135–145)

## 2011-12-29 MED ORDER — OXYCODONE HCL 5 MG PO TABS: 5.0000 mg | ORAL_TABLET | ORAL | Status: DC | PRN

## 2011-12-29 NOTE — Progress Notes (Signed)
Patient ID: Joyce Richardson, female   DOB: 12-Feb-1946, 66 y.o.   MRN: [id number] PATIENT ID:      Joyce Richardson  MRN:     [id number] DOB/AGE:    1945-12-16 / 66 y.o.    PROGRESS NOTE Subjective:  negative for Chest Pain  negative for Shortness of Breath  negative for Nausea/Vomiting   negative for Calf Pain  negative for Bowel Movement   Tolerating Diet: yes         Patient reports pain as mild.    Objective: Vital signs in last 24 hours:  Patient Vitals for the past 24 hrs:  BP Temp Temp src Pulse Resp SpO2 Height Weight  12/29/11 0520 101/50 mmHg 97.4 F (36.3 C) - 78  16  93 % - -  12/29/11 0435 - - - - - - 5' (1.524 m) 89.9 kg (198 lb 3.1 oz)  12/29/11 0200 101/48 mmHg 98.1 F (36.7 C) Oral 84  17  98 % - -  12/29/11 0152 92/43 mmHg 98 F (36.7 C) - 78  16  92 % - -  12/28/11 2200 95/45 mmHg 98 F (36.7 C) Oral 83  18  96 % - -  12/28/11 1840 120/69 mmHg 97.9 F (36.6 C) Oral 102  20  95 % - -  12/28/11 1645 - 96.9 F (36.1 C) - - - - - -  12/28/11 1443 - - - 74  15  98 % - -  12/28/11 1138 134/90 mmHg 97.6 F (36.4 C) Oral 78  18  94 % - -      Intake/Output from previous day:   02/05 0701 - 02/06 0700 In: 2532.5 [P.O.:720; I.V.:1412.5] Out: 20    Intake/Output this shift:       Intake/Output      02/05 0701 - 02/06 0700 02/06 0701 - 02/07 0700   P.O. 720    I.V. (mL/kg) 1412.5 (15.7)    IV Piggyback 400    Total Intake(mL/kg) 2532.5 (28.2)    Blood 20    Total Output 20    Net +2512.5         Urine Occurrence 3 x       LABORATORY DATA:  Basename 12/23/11 0850  WBC 15.4*  HGB 12.9  HCT 41.5  PLT 325    Basename 12/29/11 0510 12/23/11 0850  NA 137 138  K 4.2 4.1  CL 104 99  CO2 23 27  BUN 9 12  CREATININE 0.57 0.62  GLUCOSE 108* 95  CALCIUM 8.5 8.9   No results found for this basename: INR, PROTIME    Examination:  General appearance: alert, cooperative and morbidly obese Resp: clear to auscultation bilaterally Cardio: regular  rate and rhythm GI: normal findings: bowel sounds normal  Wound Exam: clean, dry, intact   Drainage:  None: wound tissue dry  Motor Exam Pinch, Wrist Dorsiflexion, EDC, FDP and EPL Intact  Sensory Exam Radial, Ulnar and Median normal  Radial pulse intact   Assessment:    1 Day Post-Op  Procedure(s) (LRB): SHOULDER ARTHROSCOPY WITH DISTAL CLAVICLE RESECTION (Left)  ADDITIONAL DIAGNOSIS:  Active Problems:  * No active hospital problems. *      Plan: Occupational Therapy as ordered Non Weight Bearing (NWB)  DVT Prophylaxis:  Foot Pumps and TED hose  DISCHARGE PLAN: Home  DISCHARGE NEEDS: none         Joyce Richardson 12/29/2011, 8:13 AM

## 2011-12-29 NOTE — Discharge Summary (Signed)
PATIENT ID:      Joyce Richardson  MRN:     [id number] DOB/AGE:    1946-05-30 / 66 y.o.     DISCHARGE SUMMARY  ADMISSION DATE:    12/28/2011 DISCHARGE DATE:   12/29/2011   ADMISSION DIAGNOSIS: AC ARTHRITIS, IMPINGEMENT ADHESIVE CAPULITIS, LEFT SHOULDER  (AC ARTHRITIS, IMPINGEMENT ADHESIVE CAPULITIS, LEFT SHOULDER)  DISCHARGE DIAGNOSIS:  AC ARTHRITIS, IMPINGEMENT ADHESIVE CAPULITIS, LEFT SHOULDER    ADDITIONAL DIAGNOSIS: Active Problems:  * No active hospital problems. *   Past Medical History  Diagnosis Date  . DIABETES MELLITUS, TYPE II 08/09/2007  . HYPERLIPIDEMIA 08/09/2007  . LEUKOCYTOSIS 10/20/2010  . ANXIETY 02/09/2010  . ABUSE, ALCOHOL, IN REMISSION 08/09/2007  . TOBACCO USE 08/09/2007  . NARCOTIC ABUSE 12/18/2008  . DEPRESSION 08/09/2007  . HYPERTENSION 09/07/2007  . Chronic diastolic heart failure 02/10/2010  . BRONCHITIS, CHRONIC 05/05/2009  . COPD 02/22/2008  . GERD 08/09/2007  . DIVERTICULOSIS-COLON 10/07/2008  . IBS 09/30/2008  . SEIZURE DISORDER 08/09/2007  . ABDOMINAL PAIN, GENERALIZED, CHRONIC 09/30/2008  . INCREASED BLOOD PRESSURE 08/09/2007  . PERSONAL HX COLONIC POLYPS 10/07/2008  . DIVERTICULITIS, HX OF 08/09/2007  . Personal history of failed moderate sedation 10/07/2008  . DRUG ABUSE, HX OF 12/22/2009    PROCEDURE: Procedure(s): Left SHOULDER ARTHROSCOPY, SUBACROMIAL DECOMPRESSION WITH DISTAL CLAVICLE RESECTION on 12/28/2011  CONSULTS:   NONE  HISTORY:65 Y/O FEMALE WITH LONG STANDING LEFT SHOULDER PAIN REFRACTORY TTO CONSERVATIVE TREATMENT.  MRI REVEALED PARTIAL CUFF TEAR WITH TENDONITIS.  HOSPITAL COURSE:  Joyce Richardson is a 66 y.o. admitted on 12/28/2011 and found to have a diagnosis of AC ARTHRITIS, IMPINGEMENT ADHESIVE CAPULITIS, LEFT SHOULDER.  After appropriate laboratory studies were obtained  they were taken to the operating room on 12/28/2011 and underwent Procedure(s): LEFT SHOULDER ARTHROSCOPY, SUBACROMIAL DECOMPRESSION WITH DISTAL CLAVICLE RESECTION.     They were given perioperative antibiotics:  Anti-infectives     Start     Dose/Rate Route Frequency Ordered Stop   12/29/11 0300   vancomycin (VANCOCIN) IVPB 1000 mg/200 mL premix        1,000 mg 200 mL/hr over 60 Minutes Intravenous  Once 12/28/11 1837 12/29/11 0506        . Blood products given:none  POD # 1, DOING WELL.  IV D/C'D  The remainder of the hospital course was dedicated to ambulation and strengthening.   The patient was discharged on 1 Day Post-Op in  Stable condition.   DIAGNOSTIC STUDIES: Recent vital signs: Patient Vitals for the past 24 hrs:  BP Temp Temp src Pulse Resp SpO2 Height Weight  12/29/11 0520 101/50 mmHg 97.4 F (36.3 C) - 78  16  93 % - -  12/29/11 0435 - - - - - - 5' (1.524 m) 89.9 kg (198 lb 3.1 oz)  12/29/11 0200 101/48 mmHg 98.1 F (36.7 C) Oral 84  17  98 % - -  12/29/11 0152 92/43 mmHg 98 F (36.7 C) - 78  16  92 % - -  12/28/11 2200 95/45 mmHg 98 F (36.7 C) Oral 83  18  96 % - -  12/28/11 1840 120/69 mmHg 97.9 F (36.6 C) Oral 102  20  95 % - -  12/28/11 1645 - 96.9 F (36.1 C) - - - - - -  12/28/11 1443 - - - 74  15  98 % - -  12/28/11 1138 134/90 mmHg 97.6 F (36.4 C) Oral 78  18  94 % - -  Recent laboratory studies:  [facility] 12/23/11 0850  WBC 15.4*  HGB 12.9  HCT 41.5  PLT 325    Basename 12/29/11 0510 12/23/11 0850  NA 137 138  K 4.2 4.1  CL 104 99  CO2 23 27  BUN 9 12  CREATININE 0.57 0.62  GLUCOSE 108* 95  CALCIUM 8.5 8.9   No results found for this basename: INR, PROTIME     Recent Radiographic Studies :  Dg Chest 2 View  12/23/2011  *RADIOLOGY REPORT*  Clinical Data: Right shoulder subacromial decompression.  CHEST - 2 VIEW  Comparison: 07/14/2011 and 12/26/2009.  Findings: Trachea is midline.  Heart size stable. Emphysema. Linear densities at both lung bases appear largely chronic.  There may be slight increase at the right lung base when compared with prior exams.  Question tiny scattered calcified  granulomas or vessels on end in the upper lung zones.  No pleural fluid.  Mild compression of an upper thoracic vertebral body appears new from 12/26/2009.  IMPRESSION:  1.  Emphysema with bibasilar scarring.  Difficult to exclude superimposed airspace disease at the right lung base.  Please correlate clinically. 2.  Mild compression of an upper thoracic vertebral body, age indeterminate.  Original Report Authenticated By: Reyes Ivan, M.D.    DISCHARGE INSTRUCTIONS: Discharge Orders    Future Orders Please Complete By Expires   Diet - low sodium heart healthy      Call MD / Call 911      Comments:   If you experience chest pain or shortness of breath, CALL 911 and be transported to the hospital emergency room.  If you develope a fever above 101 F, pus (white drainage) or increased drainage or redness at the wound, or calf pain, call your surgeon's office.   Constipation Prevention      Comments:   Drink plenty of fluids.  Prune juice may be helpful.  You may use a stool softener, such as Colace (over the counter) 100 mg twice a day.  Use MiraLax (over the counter) for constipation as needed.   Increase activity slowly as tolerated      Patient may shower      Comments:   Keep dressing on till Friday then remove and place bandaids on 3 wounds.  May showing over dressing  Then You may shower without a dressing once there is no drainage.  Do not wash over the wound.  If drainage remains, cover wound with plastic wrap and then shower.   Driving restrictions      Comments:   No driving for 6 weeks   Lifting restrictions      Comments:   No lifting for 6 weeks   Discharge instructions      Comments:   May do exercises as taught.  Use sling for comfort and protection      DISCHARGE MEDICATIONS:   Medication List  As of 12/29/2011  8:25 AM   TAKE these medications         aspirin EC 81 MG tablet   Take 81 mg by mouth daily.      atorvastatin 10 MG tablet   Commonly known as: LIPITOR    Take 10 mg by mouth daily.      budesonide-formoterol 160-4.5 MCG/ACT inhaler   Commonly known as: SYMBICORT   Inhale 2 puffs into the lungs daily as needed. For congestion      EFFEXOR XR 150 MG 24 hr capsule   Generic drug: venlafaxine  Take 150 mg by mouth 2 (two) times daily.      estrogens (conjugated) 0.625 MG tablet   Commonly known as: PREMARIN   Take 0.625 mg by mouth daily.      exenatide 10 MCG/0.04ML Soln   Commonly known as: BYETTA   Inject 10 mcg into the skin 2 (two) times daily with a meal.      furosemide 20 MG tablet   Commonly known as: LASIX   Take 20 mg by mouth daily.      LANTUS 100 UNIT/ML injection   Generic drug: insulin glargine   Inject 30 Units into the skin at bedtime.      levETIRAcetam 250 MG tablet   Commonly known as: KEPPRA   Take 250 mg by mouth 2 (two) times daily.      losartan 25 MG tablet   Commonly known as: COZAAR   Take 25 mg by mouth daily.      Magnesium 200 MG Tabs   Take 1 tablet by mouth daily.      metFORMIN 500 MG tablet   Commonly known as: GLUCOPHAGE   Take 1,500 mg by mouth daily.      MULTIPLE VITAMIN PO   Take 1 tablet by mouth daily.      omeprazole 20 MG capsule   Commonly known as: PRILOSEC   20 tablets Daily.      oxyCODONE 5 MG immediate release tablet   Commonly known as: Oxy IR/ROXICODONE   Take 1-2 tablets (5-10 mg total) by mouth every 4 (four) hours as needed.      potassium chloride 10 MEQ CR tablet   Commonly known as: KLOR-CON   Take 10 mEq by mouth daily.      pregabalin 300 MG capsule   Commonly known as: LYRICA   Take 600 mg by mouth at bedtime.      PROAIR HFA 108 (90 BASE) MCG/ACT inhaler   Generic drug: albuterol   Inhale 1-2 puffs into the lungs every 4 (four) hours as needed. For shortness of breath or wheezing        QUEtiapine 100 MG tablet   Commonly known as: SEROQUEL   Take 200 mg by mouth at bedtime.      tiotropium 18 MCG inhalation capsule   Commonly known as:  SPIRIVA   Place 1 capsule (18 mcg total) into inhaler and inhale daily.      ZOFRAN 4 MG tablet   Generic drug: ondansetron   Take 4 mg by mouth 3 (three) times daily as needed. for nausea            FOLLOW UP VISIT:   Follow-up Information    Follow up with Valeria Batman, MD on 01/03/2012.   Contact information:   [address] [place] [address] [phone]          DISPOSITION:  Home  Home or Self Care  CONDITION:  Stable   Joyce Richardson 12/29/2011, 8:25 AM

## 2011-12-29 NOTE — Op Note (Signed)
Joyce Richardson, Joyce Richardson NO.:  [id number]  MEDICAL RECORD NO.:  [id number]  LOCATION:  5029                         FACILITY:  [facility]  PHYSICIAN:  Claude Manges. Whitfield, M.D.DATE OF BIRTH:  07/21/1946  DATE OF PROCEDURE: DATE OF DISCHARGE:                              OPERATIVE REPORT   PREOPERATIVE DIAGNOSES: 1. Impingement left shoulder with degenerative joint disease     acromioclavicular joint. 2. Arthrofibrosis.  POSTOPERATIVE DIAGNOSES: 1. Impingement left shoulder with degenerative joint disease     acromioclavicular joint with partial rotator cuff tear. 2. Arthrofibrosis.  PROCEDURE: 1. Evaluation of left shoulder under anesthesia with manipulation. 2. Diagnostic arthroscopy left shoulder with debridement of partial     rotator cuff tear. 3. Arthroscopic subacromial decompression. 4. Arthroscopic distal clavicle resection.  SURGEON:  Claude Manges. Cleophas Dunker, MD  ASSISTANT:  Jacqualine Code, [facility]  ANESTHESIA:  General with supplemental interscalene nerve block.  COMPLICATIONS:  None.  PROCEDURE:  Ms. Longo was met in the holding area, identified the left shoulder as appropriate operative site.  She received preoperative interscalene nerve block.  The patient was then transported to room #1 and placed under general anesthesia without difficulty.  She was then placed in semi-sitting position with the shoulder frame.  The left shoulder was then evaluated after a time-out was called.  The patient had evidence of adhesive capsulitis.  Manipulation was performed.  The shoulder was then prepped with DuraPrep from the base of the neck, circumferentially below the elbow.  Sterile draping was performed.  Marking pen was used to outline the [facility] joint, the coracoid, and acromion at a point a fingerbreadth posterior medial to the posterior angle acromion a small stab wound was made.  The arthroscope was easily placed into the shoulder joint.  Diagnostic  arthroscopy revealed a partial tear of the rotator cuff.  There was some mild bleeding from the manipulation.  There was some mild synovitis.  A second portal was established anteriorly, and I debrided the rotator cuff with the ArthroCare wand and performed synovectomy with the same ArthroCare wand.  I did not see any appreciable chondromalacia of labral tearing.  No loose bodies.  Biceps tendon appeared to be intact.  Arthroscope was then placed in the subacromial space posteriorly with the cane in the subacromial space anteriorly and third  portal established lateral subacromial space.  Arthroscopic subacromial decompression was performed.  There was abundant inflammatory bursal tissue which was resected with the ArthroCare wand.  There was also significant overhang of the [facility] joint with synovitis.  An anterior inferior acromioplasty was performed.  The 6-mm bur bleeding was controlled with the ArthroCare wand.  I had a nice flat resection of the distal clavicle with evidence of chondromalacia with the same 6 mm bur.  I did not see any evidence of tearing of the cuff on the bursal surface.  I had a very nice decompression and nice resection of the distal clavicle.  The 3 wounds were irrigated.  The anterior and lateral wounds were closed with interrupted 4-0 Ethibond, sterile bulky dressing was applied followed by a sling.  PLAN:  The patient will be admitted for 23-hour observation, discharged in the morning with a  sling and will follow in the office in a week. Percocet for pain.     Claude Manges. Cleophas Dunker, M.D.     PWW/MEDQ  D:  12/28/2011  T:  12/29/2011  Job:  [id number]

## 2012-01-01 ENCOUNTER — Encounter (HOSPITAL_COMMUNITY): Payer: Self-pay | Admitting: *Deleted

## 2012-01-01 ENCOUNTER — Other Ambulatory Visit: Payer: Self-pay

## 2012-01-01 ENCOUNTER — Emergency Department (HOSPITAL_COMMUNITY)
Admission: EM | Admit: 2012-01-01 | Discharge: 2012-01-01 | Disposition: A | Discharge status: Home / Self Care | Payer: Medicare Other | Attending: Emergency Medicine | Admitting: Emergency Medicine

## 2012-01-01 DIAGNOSIS — J449 Chronic obstructive pulmonary disease, unspecified: Secondary | ICD-10-CM | POA: Insufficient documentation

## 2012-01-01 DIAGNOSIS — E875 Hyperkalemia: Secondary | ICD-10-CM | POA: Insufficient documentation

## 2012-01-01 DIAGNOSIS — M62838 Other muscle spasm: Secondary | ICD-10-CM | POA: Insufficient documentation

## 2012-01-01 DIAGNOSIS — J4489 Other specified chronic obstructive pulmonary disease: Secondary | ICD-10-CM | POA: Insufficient documentation

## 2012-01-01 DIAGNOSIS — E119 Type 2 diabetes mellitus without complications: Secondary | ICD-10-CM | POA: Insufficient documentation

## 2012-01-01 DIAGNOSIS — I10 Essential (primary) hypertension: Secondary | ICD-10-CM | POA: Insufficient documentation

## 2012-01-01 DIAGNOSIS — R569 Unspecified convulsions: Secondary | ICD-10-CM | POA: Insufficient documentation

## 2012-01-01 DIAGNOSIS — E785 Hyperlipidemia, unspecified: Secondary | ICD-10-CM | POA: Insufficient documentation

## 2012-01-01 DIAGNOSIS — Z794 Long term (current) use of insulin: Secondary | ICD-10-CM | POA: Insufficient documentation

## 2012-01-01 DIAGNOSIS — K219 Gastro-esophageal reflux disease without esophagitis: Secondary | ICD-10-CM | POA: Insufficient documentation

## 2012-01-01 DIAGNOSIS — F17200 Nicotine dependence, unspecified, uncomplicated: Secondary | ICD-10-CM | POA: Insufficient documentation

## 2012-01-01 DIAGNOSIS — R252 Cramp and spasm: Secondary | ICD-10-CM | POA: Insufficient documentation

## 2012-01-01 LAB — BASIC METABOLIC PANEL
BUN: 11 mg/dL (ref 6–23)
CO2: 19 mEq/L (ref 19–32)
CO2: 27 mEq/L (ref 19–32)
Calcium: 8.9 mg/dL (ref 8.4–10.5)
Calcium: 8.8 mg/dL (ref 8.4–10.5)
Creatinine, Ser: 0.47 mg/dL — ABNORMAL LOW (ref 0.50–1.10)
Creatinine, Ser: 0.62 mg/dL (ref 0.50–1.10)
Glucose, Bld: 133 mg/dL — ABNORMAL HIGH (ref 70–99)
Glucose, Bld: 161 mg/dL — ABNORMAL HIGH (ref 70–99)
Sodium: 139 mEq/L (ref 135–145)

## 2012-01-01 MED ORDER — SODIUM POLYSTYRENE SULFONATE 15 GM/60ML PO SUSP
15.0000 g | Freq: Once | ORAL | Status: AC
  Administered 2012-01-01: 15 g via ORAL
  Filled 2012-01-01: qty 60

## 2012-01-01 MED ORDER — OXYCODONE-ACETAMINOPHEN 5-325 MG PO TABS
2.0000 | ORAL_TABLET | Freq: Once | ORAL | Status: AC
  Administered 2012-01-01: 2 via ORAL
  Filled 2012-01-01: qty 2

## 2012-01-01 MED ORDER — SODIUM CHLORIDE 0.9 % IV BOLUS (SEPSIS)
1000.0000 mL | Freq: Once | INTRAVENOUS | Status: AC
  Administered 2012-01-01: 1000 mL via INTRAVENOUS

## 2012-01-01 MED ORDER — SODIUM CHLORIDE 0.9 % IV BOLUS (SEPSIS)
500.0000 mL | Freq: Once | INTRAVENOUS | Status: AC
  Administered 2012-01-01: 500 mL via INTRAVENOUS

## 2012-01-01 MED ORDER — MORPHINE SULFATE 4 MG/ML IJ SOLN
4.0000 mg | Freq: Once | INTRAMUSCULAR | Status: AC
  Administered 2012-01-01: 4 mg via INTRAVENOUS
  Filled 2012-01-01: qty 1

## 2012-01-01 NOTE — ED Provider Notes (Signed)
Medical screening examination/treatment/procedure(s) were conducted as a shared visit with non-physician practitioner(s) and myself.  I personally evaluated the patient during the encounter  As numbness which is gradually increased over the past several days. She's been battling a flulike illness. She describes them as muscle cramps  Unremarkable examination. Found to have a potassium of 5.4. She'll receive IV fluids and Kayexalate. Will be rechecked in the CDU. If improved will be discharged home  Dayton Bailiff, MD 01/01/12 [phone]

## 2012-01-01 NOTE — ED Notes (Signed)
Pt. States, "I'm here for generalized spasms all over, not the flu;" spasms since rotator cuff on Tuesday. Husband had to help get her out of bed this morning.

## 2012-01-01 NOTE — ED Provider Notes (Signed)
History     CSN: [id number]  Arrival date & time 01/01/12  4098   First MD Initiated Contact with Patient 01/01/12 1000      No chief complaint on file.   (Consider location/radiation/quality/duration/timing/severity/associated sxs/prior treatment) HPI  66 year old female with a significant history of diabetes, anxiety, and CHF is presenting to the ED with muscle cramps and muscle spasm. Patient states she has a left rotator cuff surgery 4 days ago. Since after the surgery she has been experiencing intermittent bouts of muscle cramping. Cramping can be felt throughout the body. She also noticed occasional muscle spasm. She denies muscle pain, fever, chest pain, shortness of breath. She denies nausea vomiting or diarrhea. Patient states she is having decrease in appetite and had not been drinking and eating as normal which is unusual for her. She denies flulike symptoms including no sneezing, coughing, runny nose, or nasal congestion. She denies leg swelling or calf pain. Patient denies any recent medication changes.  Past Medical History  Diagnosis Date  . DIABETES MELLITUS, TYPE II 08/09/2007  . HYPERLIPIDEMIA 08/09/2007  . LEUKOCYTOSIS 10/20/2010  . ANXIETY 02/09/2010  . ABUSE, ALCOHOL, IN REMISSION 08/09/2007  . TOBACCO USE 08/09/2007  . NARCOTIC ABUSE 12/18/2008  . DEPRESSION 08/09/2007  . HYPERTENSION 09/07/2007  . Chronic diastolic heart failure 02/10/2010  . BRONCHITIS, CHRONIC 05/05/2009  . COPD 02/22/2008  . GERD 08/09/2007  . DIVERTICULOSIS-COLON 10/07/2008  . IBS 09/30/2008  . SEIZURE DISORDER 08/09/2007  . ABDOMINAL PAIN, GENERALIZED, CHRONIC 09/30/2008  . INCREASED BLOOD PRESSURE 08/09/2007  . PERSONAL HX COLONIC POLYPS 10/07/2008  . DIVERTICULITIS, HX OF 08/09/2007  . Personal history of failed moderate sedation 10/07/2008  . DRUG ABUSE, HX OF 12/22/2009    Past Surgical History  Procedure Date  . Appendectomy   . Abdominal hysterectomy   . Cholecystectomy   . Hemorrhoid  surgery     No family history on file.  History  Substance Use Topics  . Smoking status: Current Everyday Smoker -- 0.2 packs/day  . Smokeless tobacco: Not on file  . Alcohol Use: No    OB History    Grav Para Term Preterm Abortions TAB SAB Ect Mult Living                  Review of Systems  All other systems reviewed and are negative.    Allergies  Other and Penicillins  Home Medications   Current Outpatient Rx  Name Route Sig Dispense Refill  . ALBUTEROL SULFATE HFA 108 (90 BASE) MCG/ACT IN AERS Inhalation Inhale 1-2 puffs into the lungs every 4 (four) hours as needed. For shortness of breath or wheezing     . ASPIRIN EC 81 MG PO TBEC Oral Take 81 mg by mouth daily.    . ATORVASTATIN CALCIUM 10 MG PO TABS Oral Take 10 mg by mouth daily.    . BUDESONIDE-FORMOTEROL FUMARATE 160-4.5 MCG/ACT IN AERO Inhalation Inhale 2 puffs into the lungs daily as needed. For congestion    . ESTROGENS CONJUGATED 0.625 MG PO TABS Oral Take 0.625 mg by mouth daily.    Marland Kitchen EXENATIDE 10 MCG/0.04ML [place] SOLN Subcutaneous Inject 10 mcg into the skin 2 (two) times daily with a meal.     . FUROSEMIDE 20 MG PO TABS Oral Take 20 mg by mouth daily.      . INSULIN GLARGINE 100 UNIT/ML [place] SOLN Subcutaneous Inject 30 Units into the skin at bedtime.     Marland Kitchen LEVETIRACETAM 250 MG PO TABS  Oral Take 250 mg by mouth 2 (two) times daily.     Marland Kitchen LOSARTAN POTASSIUM 25 MG PO TABS Oral Take 25 mg by mouth daily.     Marland Kitchen MAGNESIUM 200 MG PO TABS Oral Take 1 tablet by mouth daily.     Marland Kitchen METFORMIN HCL 500 MG PO TABS Oral Take 1,500 mg by mouth daily.     . MULTIPLE VITAMIN PO Oral Take 1 tablet by mouth daily.     Marland Kitchen OMEPRAZOLE 20 MG PO CPDR  20 tablets Daily.    Marland Kitchen ONDANSETRON HCL 4 MG PO TABS Oral Take 4 mg by mouth 3 (three) times daily as needed. for nausea    . OXYCODONE HCL 5 MG PO TABS Oral Take 1-2 tablets (5-10 mg total) by mouth every 4 (four) hours as needed. 60 tablet 0  . POTASSIUM CHLORIDE 10 MEQ PO TBCR Oral  Take 10 mEq by mouth daily.      Marland Kitchen PREGABALIN 300 MG PO CAPS Oral Take 600 mg by mouth at bedtime.     Marland Kitchen QUETIAPINE FUMARATE 100 MG PO TABS Oral Take 200 mg by mouth at bedtime.     Marland Kitchen TIOTROPIUM BROMIDE MONOHYDRATE 18 MCG IN CAPS Inhalation Place 1 capsule (18 mcg total) into inhaler and inhale daily. 90 capsule 3  . VENLAFAXINE HCL ER 150 MG PO CP24 Oral Take 150 mg by mouth 2 (two) times daily.        BP 107/49  Pulse 83  Temp(Src) 97.8 F (36.6 C) (Oral)  Resp 16  SpO2 91%  Physical Exam  Nursing note and vitals reviewed. Constitutional: She appears well-developed and well-nourished. No distress.  HENT:  Head: Atraumatic.  Eyes: Conjunctivae are normal.  Neck: Neck supple.  Cardiovascular: Normal rate.   Pulmonary/Chest: Effort normal. No respiratory distress.  Abdominal: Soft.  Musculoskeletal: Normal range of motion.       Patient has normal sensation and normal strength throughout all extremities. No overlying skin changes. Nontender on palpation.  Skin: Skin is warm.  Psychiatric: She has a normal mood and affect.    ED Course  Procedures (including critical care time)  Labs Reviewed - No data to display No results found.   No diagnosis found.   Date: 01/01/2012  Rate: 80  Rhythm: normal sinus rhythm  QRS Axis: normal  Intervals: normal  ST/T Wave abnormalities: normal  Conduction Disutrbances:none  Narrative Interpretation: no hyperacute T-wave  Old EKG Reviewed: unchanged     MDM  Muscle spasm and muscle cramping for the past 4 days. Patient does complain of decreased appetite without significant abdominal pain or nausea. Her current impression is 90 systolic however she denies lightheadedness or dizziness.  orthostatic vital sign, along with a dry panel will be obtained. Patient will be giving IV fluid. She is currently in no acute distress with no focal neuro deficit or weakness  1:36 PM Normal orthostatic VS.  patient has an elevated potassium of  5.4. She has normal calcium and normal magnesium level. Her glucose is 133. She has normal renal function. IV fluid given. Kayexalate 15mg  via PO given.  Pt will be moved to CDU.  We will recheck her potassium at 1600.        Fayrene Helper, PA-C 01/01/12 1358

## 2012-01-01 NOTE — ED Notes (Signed)
Pt ambulating in hallway w/spouse w/o difficulty.

## 2012-01-01 NOTE — ED Notes (Signed)
CHO modified tray ordered for pt.  Pt requesting Coke - advised may have Diet Coke - pt refused - requested ice water and sandwich - given to pt while awaiting tray.

## 2012-01-01 NOTE — ED Provider Notes (Cosign Needed)
Sign out given by Dr. Brooke Dare. Patient having muscle spasm. Found to have mildly elevated K+ that has been treated with kayexalate with recheck of potassium at 4pm. Result pending. Patient is resting comfortably in room complaining of mild pain in shoulder. VSS.   Jenness Corner, [place] 01/01/12 1638  Potassium is now 3.9. Patient is ambulating about the department without difficulty and states she is feeling much better. She has appt with her doctor on Monday for potassium recheck. VSS.   Jenness Corner, [place] 01/01/12 [phone]

## 2012-01-01 NOTE — ED Notes (Signed)
Unresolved flu for x 1 week. Feverish. VSS - bp 100/60, hr 86; temp yesterday 100 F; cbg 176; recent lt. rotator cuff surgery this past Tuesday.

## 2012-01-03 ENCOUNTER — Ambulatory Visit (INDEPENDENT_AMBULATORY_CARE_PROVIDER_SITE_OTHER): Payer: Medicare Other | Admitting: Internal Medicine

## 2012-01-03 ENCOUNTER — Encounter: Payer: Self-pay | Admitting: Internal Medicine

## 2012-01-03 VITALS — BP 100/70 | HR 72 | Temp 98.7°F | Wt 196.0 lb

## 2012-01-03 DIAGNOSIS — F17200 Nicotine dependence, unspecified, uncomplicated: Secondary | ICD-10-CM

## 2012-01-03 DIAGNOSIS — E119 Type 2 diabetes mellitus without complications: Secondary | ICD-10-CM

## 2012-01-03 DIAGNOSIS — R202 Paresthesia of skin: Secondary | ICD-10-CM

## 2012-01-03 DIAGNOSIS — M79601 Pain in right arm: Secondary | ICD-10-CM | POA: Insufficient documentation

## 2012-01-03 DIAGNOSIS — R209 Unspecified disturbances of skin sensation: Secondary | ICD-10-CM

## 2012-01-03 DIAGNOSIS — M79609 Pain in unspecified limb: Secondary | ICD-10-CM

## 2012-01-03 DIAGNOSIS — R252 Cramp and spasm: Secondary | ICD-10-CM | POA: Insufficient documentation

## 2012-01-03 DIAGNOSIS — J449 Chronic obstructive pulmonary disease, unspecified: Secondary | ICD-10-CM

## 2012-01-03 LAB — BASIC METABOLIC PANEL
BUN: 11 mg/dL (ref 6–23)
GFR: 87.17 mL/min (ref >60.00)
Glucose, Bld: 135 mg/dL — ABNORMAL HIGH (ref 70–99)
Potassium: 3.7 mEq/L (ref 3.5–5.1)

## 2012-01-03 LAB — CBC WITH DIFFERENTIAL/PLATELET
Basophils Relative: 0.1 % (ref 0.0–3.0)
Eosinophils Relative: 3 % (ref 0.0–5.0)
Hemoglobin: 12.4 g/dL (ref 12.0–15.0)
Lymphocytes Relative: 32.1 % (ref 12.0–46.0)
Monocytes Relative: 6.5 % (ref 3.0–12.0)
Neutro Abs: 6.9 10*3/uL (ref 1.4–7.7)
RBC: 4.55 Mil/uL (ref 3.87–5.11)

## 2012-01-03 LAB — TSH: TSH: 1.19 u[IU]/mL (ref 0.35–5.50)

## 2012-01-03 NOTE — Patient Instructions (Signed)
Will notify you  of labs when available. Unsure why you are having these symptoms but could be related to meds and   Surgery and metabolic   Disruption that should be getting better.

## 2012-01-03 NOTE — Progress Notes (Signed)
Subjective:    Patient ID: Joyce Richardson, female    DOB: [birth year], 66 y.o.   MRN: [id number]  HPI As in today with her husband for an acute ST appointment. She is having problems since the surgery done on her left shoulder per week ago by Dr. Cleophas Dunker. Her last visit with me was a year ago. I have been asked to see her because of a problem with weak feeling in her arms right more than left after her surgery.  She states that after the surgery that went well overnight she was at home and Awoke with right  Arm pain  Days after surgery and couldn't get up from toilet because" Muscle s wouldn't work.  " Husband has MS and was then able to get her off of the toilet which was low EMS needed to be called and she was seen in the emergency room noted to have a slightly elevated potassium given IV fluids monitor and also given Kayexalate at and sent home. She was observed all day.. she still doesn't feel right but is better than man feels tingling that comes and goes in all extremities the right arm pain is better but is still tender in the right antecubital area. No falling no major changes in medicines. Pain medication is a few times a day and being monitored.   In the last year she has been seen by Dr. Adria Devon and her hemoglobin A1c is much better at 6.6 She is still going to her meetings And substance free except for prescribed medicines as needed. She still smoking but denies chest pain shortness of breath today She states that she hasn't missed her medicines except when in the emergency room or during surgery Her blood pressure had been stable.    Review of Systems Negative for chest pain shortness of breath bleeding vision change syncope falling. Rest as per history of present illness   Past history family history social history reviewed in the electronic medical record.     Objective:   Physical Exam Well-developed well-nourished alert with her husband in no acute distress with a  left sling. HEENT grossly symmetric speech within normal limits tongue midline neck without masses or thyromegaly Chest:  Clear to A&P without wheezes rales or rhonchi CV:  S1-S2 no gallops or murmurs peripheral perfusion is normal  Extremities show no fasciculations flap or cogwheeling. Right upper extremity without edema old hematoma or IV was on hand she is tender in the right biceps area antecubital area but no poor or specific swelling is noted. Range of motion is normal and grip strength is normal Gait is nonantalgic at this time  Reviewed emergency room records which showed a potassium of 5.2 and on discharge 5.6 range kidney function was normal magnesium was 1.7 range.       Assessment & Plan:  Status post emergency room visit for weakness muscle strength issues postoperatively as described above without specific diagnoses.  It is unclear what her hydration status was at the time of this incident perhaps dehydrated with myositis however her metabolic parameters apparently were normal when she left the emergency room. He is on many medications and any interactions possible   Have not been involved in her care for the last year but she is apparently done quite well..  Discussed differential diagnosis will get metabolic parameters rechecked including a B12 CPK magnesium and I night calcium.  I do not have specific recommendations otherwise except to stay hydrated monitor her blood sugar  will get copy of labs sent to her specialists and followup.

## 2012-01-05 ENCOUNTER — Other Ambulatory Visit: Payer: Self-pay | Admitting: Internal Medicine

## 2012-01-05 DIAGNOSIS — R252 Cramp and spasm: Secondary | ICD-10-CM

## 2012-01-05 DIAGNOSIS — R748 Abnormal levels of other serum enzymes: Secondary | ICD-10-CM | POA: Insufficient documentation

## 2012-01-05 NOTE — Progress Notes (Signed)
Quick Note:  Pt aware of results. Orders placed in epic and labs sent to Dr. Evlyn Kanner and Dr. Cleophas Dunker. ______

## 2012-01-07 ENCOUNTER — Telehealth: Payer: Self-pay | Admitting: *Deleted

## 2012-01-07 NOTE — Telephone Encounter (Signed)
Pt is having increase muscle and bone pain. She is having muscle spasms at night are worse. Falling out of bed. Poor word recall. Having pops in her shoulder. Her shoulder is now swollen and hot. They have call Dr. Hoy Register office about this. Not slurring her speech. Not finding specific words. No Chest pains or SOB.

## 2012-01-07 NOTE — Telephone Encounter (Signed)
Per Dr. Fabian Sharp- Have Dr. Hoy Register office see her to make sure that there is not a post op infection.

## 2012-01-07 NOTE — Telephone Encounter (Signed)
Pt's husband aware...

## 2012-01-10 ENCOUNTER — Other Ambulatory Visit (INDEPENDENT_AMBULATORY_CARE_PROVIDER_SITE_OTHER): Payer: Medicare Other

## 2012-01-10 DIAGNOSIS — R252 Cramp and spasm: Secondary | ICD-10-CM

## 2012-01-10 DIAGNOSIS — R748 Abnormal levels of other serum enzymes: Secondary | ICD-10-CM

## 2012-01-12 LAB — CK ISOENZYMES
CK-MB: 0 % (ref <5)
CK-MM: 100 % (ref 95–100)

## 2012-01-19 NOTE — Progress Notes (Signed)
Quick Note:  Pt aware of this. She is feeling better than she did. ______

## 2012-01-20 ENCOUNTER — Other Ambulatory Visit: Payer: Self-pay | Admitting: Cardiology

## 2012-01-20 MED ORDER — LOSARTAN POTASSIUM 25 MG PO TABS: 25.0000 mg | ORAL_TABLET | Freq: Every day | ORAL | Status: DC

## 2012-01-20 NOTE — Telephone Encounter (Signed)
New Msg: Pt husband calling stating he needs refill of losartan 25 mg, 90 day supply with 3 refills sent to [facility].   MED ID: [id number]   Please Use:  [id number]  [place], [place] [address]

## 2012-02-21 HISTORY — PX: WRIST FRACTURE SURGERY: SHX121

## 2012-03-10 ENCOUNTER — Encounter: Payer: Self-pay | Admitting: Internal Medicine

## 2012-03-10 ENCOUNTER — Ambulatory Visit (INDEPENDENT_AMBULATORY_CARE_PROVIDER_SITE_OTHER): Payer: Medicare Other | Admitting: Internal Medicine

## 2012-03-10 VITALS — BP 110/78 | HR 88 | Temp 98.6°F | Wt 187.0 lb

## 2012-03-10 DIAGNOSIS — F17200 Nicotine dependence, unspecified, uncomplicated: Secondary | ICD-10-CM

## 2012-03-10 DIAGNOSIS — R0789 Other chest pain: Secondary | ICD-10-CM

## 2012-03-10 DIAGNOSIS — Z9189 Other specified personal risk factors, not elsewhere classified: Secondary | ICD-10-CM

## 2012-03-10 DIAGNOSIS — S61409A Unspecified open wound of unspecified hand, initial encounter: Secondary | ICD-10-CM

## 2012-03-10 DIAGNOSIS — F1011 Alcohol abuse, in remission: Secondary | ICD-10-CM

## 2012-03-10 DIAGNOSIS — E119 Type 2 diabetes mellitus without complications: Secondary | ICD-10-CM

## 2012-03-10 DIAGNOSIS — R071 Chest pain on breathing: Secondary | ICD-10-CM

## 2012-03-10 DIAGNOSIS — S61419A Laceration without foreign body of unspecified hand, initial encounter: Secondary | ICD-10-CM

## 2012-03-10 DIAGNOSIS — J4489 Other specified chronic obstructive pulmonary disease: Secondary | ICD-10-CM

## 2012-03-10 DIAGNOSIS — W06XXXA Fall from bed, initial encounter: Secondary | ICD-10-CM

## 2012-03-10 DIAGNOSIS — R569 Unspecified convulsions: Secondary | ICD-10-CM

## 2012-03-10 DIAGNOSIS — T148XXA Other injury of unspecified body region, initial encounter: Secondary | ICD-10-CM

## 2012-03-10 DIAGNOSIS — J449 Chronic obstructive pulmonary disease, unspecified: Secondary | ICD-10-CM

## 2012-03-10 MED ORDER — HYDROCODONE-ACETAMINOPHEN 7.5-325 MG PO TABS: 1.0000 | ORAL_TABLET | Freq: Four times a day (QID) | ORAL | Status: DC | PRN

## 2012-03-10 NOTE — Patient Instructions (Signed)
You  Have a chest wall injury  And may take a few weeks to get better. Take ocassional deep breath to avoid Pneumonia . Pain med as needed.

## 2012-03-10 NOTE — Progress Notes (Signed)
Subjective:    Patient ID: Joyce Richardson, female    DOB: 1946-01-23, 66 y.o.   MRN: [id number]  HPI Patient comes in today for SDA for  new problem evaluation. Here with husband  Fu from ED in [place].  Where they had gone on vacation and to visit family for a week.   Was tallking in sleep and then fell out of bed in  [place] room.    Dreamt that she was being chased .  And then husband  called resue squad and went to ED.   And had x ray  Of shoulder both . Had blood near mouth but no know seizure happened.  Had large avulsion laceration left hand and stripped  . NOw has some local wrist pain left but no swelling and good rom. Right breast pain and hurts to breath on right but no increase in cough or sob.  No syncope .  Had x ray of shoulder and clavicle area that was neg  For fx.  Left shoulder sugery Jan 5  Did well just weaned off  To one pill per day or percocet. Husband in control of pain med  Review of Systems No fever ha vision  changes  Chronic cough  Hurts to take a deep breath on right .  Shoulder better   No Fever chills . NO NVD no abd pain otherwise.  No recent known seizure . Has fu with Dr Sandria Manly soon, Hx of seizures on meds   Past history family history social history reviewed in the electronic medical record. Outpatient Prescriptions Prior to Visit  Medication Sig Dispense Refill  . albuterol (PROAIR HFA) 108 (90 BASE) MCG/ACT inhaler Inhale 1-2 puffs into the lungs every 4 (four) hours as needed. For shortness of breath or wheezing       . atorvastatin (LIPITOR) 10 MG tablet Take 10 mg by mouth daily.      . budesonide-formoterol (SYMBICORT) 160-4.5 MCG/ACT inhaler Inhale 2 puffs into the lungs daily as needed. For congestion      . estrogens, conjugated, (PREMARIN) 0.625 MG tablet Take 0.625 mg by mouth daily.      Marland Kitchen exenatide (BYETTA) 10 MCG/0.04ML SOLN Inject 10 mcg into the skin 2 (two) times daily with a meal.       . furosemide (LASIX) 20 MG tablet Take 20 mg by  mouth daily.        . insulin glargine (LANTUS) 100 UNIT/ML injection Inject 30 Units into the skin at bedtime.       . levETIRAcetam (KEPPRA) 250 MG tablet Take 250 mg by mouth 2 (two) times daily.       Marland Kitchen losartan (COZAAR) 25 MG tablet Take 1 tablet (25 mg total) by mouth daily.  90 tablet  3  . Magnesium 200 MG TABS Take 1 tablet by mouth daily.       . metFORMIN (GLUCOPHAGE) 500 MG tablet Take 500 mg by mouth daily. 500mg  tid      . MULTIPLE VITAMIN PO Take 1 tablet by mouth daily.       Marland Kitchen omeprazole (PRILOSEC) 20 MG capsule Take 20 mg by mouth Daily.       . ondansetron (ZOFRAN) 4 MG tablet Take 4 mg by mouth 3 (three) times daily as needed. for nausea      . potassium chloride (KLOR-CON) 10 MEQ CR tablet Take 10 mEq by mouth daily.        . pregabalin (LYRICA) 300 MG capsule Take  600 mg by mouth at bedtime.       Marland Kitchen QUEtiapine (SEROQUEL) 100 MG tablet Take 300 mg by mouth at bedtime.       Marland Kitchen tiotropium (SPIRIVA HANDIHALER) 18 MCG inhalation capsule Place 1 capsule (18 mcg total) into inhaler and inhale daily.  90 capsule  3  . venlafaxine (EFFEXOR XR) 150 MG 24 hr capsule Take 130 mg by mouth daily.       Marland Kitchen aspirin EC 81 MG tablet Take 81 mg by mouth daily.           Objective:   Physical Exam BP 110/78  Pulse 88  Temp(Src) 98.6 F (37 C) (Oral)  Wt 187 lb (84.823 kg)  SpO2 92% WDWN in nad except in pain when moves up and down on table   Neck: Supple without adenopathy or masses or bruits CHEST   Bruisingright breast  Area  And tender right anterior chest wall area.   No crepitus   few crack;les good bs =  Cor no g or m No clubbing cyanosis or edema Left wrist no swelling point tenderness dorsal area  But good rom and good brip np swelling  Large  avulsion laceration   Left hand dorsal area about 6-7 cm  Healing no bleeding or redness   Alert orient nl speech and affect  . No depression anxiety looks well otherwise      Assessment & Plan:  Avulsion injury to left hand  No  sx of infection Chest wall injury and contusion options discussed x ray will prob not add change in rx plan  Hx of narcotic addition and dependence  Doing well   . Ask s for some small amt of pain med until better . Under observation.   norco 7.5 given no refills Underlying copd tobacco Take deep breaths with splint ocassional.   Expectant management.  Hx of seizures   Fall out of bed Would bring this to Dr Alena Bills attention.

## 2012-03-11 DIAGNOSIS — W06XXXA Fall from bed, initial encounter: Secondary | ICD-10-CM | POA: Insufficient documentation

## 2012-03-11 DIAGNOSIS — R0789 Other chest pain: Secondary | ICD-10-CM | POA: Insufficient documentation

## 2012-03-15 ENCOUNTER — Other Ambulatory Visit: Payer: Self-pay

## 2012-03-15 NOTE — Telephone Encounter (Signed)
Rx request for hydrocodone 5-325 mg.  Rx last filled 03/10/2012.  Pt states she only has 3 pills left.  Pls advise.

## 2012-03-15 NOTE — Telephone Encounter (Signed)
If she should have had enough to get to 4 29 taking 4 x per day.   She is using too much narcotic  med . Can refill 20 # and she needs to take tylenol in between.  At this point it is going to hurt no matter what and she should try to limit to 2-3 x per day.  Use ice packs etc.

## 2012-03-16 ENCOUNTER — Ambulatory Visit (INDEPENDENT_AMBULATORY_CARE_PROVIDER_SITE_OTHER): Payer: Medicare Other | Admitting: Internal Medicine

## 2012-03-16 ENCOUNTER — Ambulatory Visit (INDEPENDENT_AMBULATORY_CARE_PROVIDER_SITE_OTHER)
Admission: RE | Admit: 2012-03-16 | Discharge: 2012-03-16 | Disposition: A | Discharge status: Home / Self Care | Payer: Medicare Other | Source: Ambulatory Visit | Attending: Internal Medicine | Admitting: Internal Medicine

## 2012-03-16 ENCOUNTER — Encounter: Payer: Self-pay | Admitting: Internal Medicine

## 2012-03-16 VITALS — BP 114/74 | HR 99 | Temp 97.8°F | Wt 190.0 lb

## 2012-03-16 DIAGNOSIS — F1011 Alcohol abuse, in remission: Secondary | ICD-10-CM

## 2012-03-16 DIAGNOSIS — S299XXA Unspecified injury of thorax, initial encounter: Secondary | ICD-10-CM

## 2012-03-16 DIAGNOSIS — R071 Chest pain on breathing: Secondary | ICD-10-CM

## 2012-03-16 DIAGNOSIS — S298XXA Other specified injuries of thorax, initial encounter: Secondary | ICD-10-CM

## 2012-03-16 DIAGNOSIS — J449 Chronic obstructive pulmonary disease, unspecified: Secondary | ICD-10-CM

## 2012-03-16 DIAGNOSIS — R0789 Other chest pain: Secondary | ICD-10-CM

## 2012-03-16 DIAGNOSIS — Z9189 Other specified personal risk factors, not elsewhere classified: Secondary | ICD-10-CM

## 2012-03-16 MED ORDER — HYDROCODONE-ACETAMINOPHEN 7.5-325 MG PO TABS: 1.0000 | ORAL_TABLET | Freq: Four times a day (QID) | ORAL | Status: AC | PRN

## 2012-03-16 NOTE — Patient Instructions (Signed)
Get the x-ray of your chest and ribs today. We will notify you of the results  You can try a rib binder if it may help you when you're up and around but you have to take deep breaths occasionally. Sometimes it takes weeks for the pain to significantly improve.  We will refill the medication I am concerned about the risk of dependence but am unsure what other than a narcotic pain medication that would help.  Please keep in touch with her sponsor and have her husband help with the pain medication to avoid relapsing.

## 2012-03-16 NOTE — Progress Notes (Signed)
Quick Note:  Pt aware ______ 

## 2012-03-16 NOTE — Progress Notes (Signed)
  Subjective:    Patient ID: MCKINLEY ADELSTEIN, female    DOB: [birth year], 66 y.o.   MRN: [id number]  HPI Patient comes in today for SDA for  Worsening problem evaluation. She is not with her husband today. See last  Ov . Her left hand is getting better However her right chest wall area is very painful a nd hard to move and breath.  Taking med almost every 4 hours ,  ibuprofen beginning to upset stomach. NO vomiting fever inc sob. NO falling .  Hurts to lay down and pain in front of chest and when upright at lateral rib wall area.  No vomiting or change in bowels  Other ortho issues are quiescent.  Review of Systems NO fever syncope new falling or change in sleep  She is on lyrica already  No swelling or new bruises  Rest as per hpi.   Past history family history social history reviewed in the electronic medical record.     Objective:   Physical Exam BP 114/74  Pulse 99  Temp(Src) 97.8 F (36.6 C) (Oral)  Wt 190 lb (86.183 kg)  SpO2 93% WDWN in nad but when moving up and down on table and laying has pain right chest wall. Neck: Supple without adenopathy or masses or bruits CHEST : bs seem =  Few musical wheezes noted bruise right breast about the same and no masses Tender right latera rib  Cage sitting and right MCL ? Cc junction when laying down. . Abd no acute findings. No clubbing cyanosis or edema oriented x 3 affect  Normal otherwise. SKIN left hand no redness healing laceration      Assessment & Plan:   right chest wall pain  Severe enough to require narcotics but problematic with her hx of addiction dependence   Has a sponsor she sees   And will be in touch with them.  I feel Great hesitation to continue/ manage narcotic meds /however she has sig pain from this injury  And not a lot of other options. Get c xray to  R/o lung pathology and obv rib fracture.  May help [place] decide on management plan.  Rib  binders may be a risk but may help her pain until this heals.  Rx  given per her request.  Plan after x ray  Call next week.  Refill  30 norco 7.5 today.

## 2012-03-16 NOTE — Telephone Encounter (Signed)
Pt was seen for office visit on 03/16/12.

## 2012-03-20 ENCOUNTER — Encounter: Payer: Self-pay | Admitting: Internal Medicine

## 2012-03-20 ENCOUNTER — Telehealth: Payer: Self-pay | Admitting: Internal Medicine

## 2012-03-20 ENCOUNTER — Ambulatory Visit (INDEPENDENT_AMBULATORY_CARE_PROVIDER_SITE_OTHER): Payer: Medicare Other | Admitting: Internal Medicine

## 2012-03-20 VITALS — BP 102/62 | HR 79 | Temp 98.4°F | Wt 193.0 lb

## 2012-03-20 DIAGNOSIS — J209 Acute bronchitis, unspecified: Secondary | ICD-10-CM

## 2012-03-20 DIAGNOSIS — S298XXA Other specified injuries of thorax, initial encounter: Secondary | ICD-10-CM

## 2012-03-20 DIAGNOSIS — F1910 Other psychoactive substance abuse, uncomplicated: Secondary | ICD-10-CM

## 2012-03-20 DIAGNOSIS — F1911 Other psychoactive substance abuse, in remission: Secondary | ICD-10-CM

## 2012-03-20 DIAGNOSIS — J42 Unspecified chronic bronchitis: Secondary | ICD-10-CM

## 2012-03-20 DIAGNOSIS — R0789 Other chest pain: Secondary | ICD-10-CM

## 2012-03-20 DIAGNOSIS — R071 Chest pain on breathing: Secondary | ICD-10-CM

## 2012-03-20 DIAGNOSIS — J449 Chronic obstructive pulmonary disease, unspecified: Secondary | ICD-10-CM

## 2012-03-20 DIAGNOSIS — S299XXA Unspecified injury of thorax, initial encounter: Secondary | ICD-10-CM

## 2012-03-20 MED ORDER — DOXYCYCLINE HYCLATE 100 MG PO CAPS: 100.0000 mg | ORAL_CAPSULE | Freq: Two times a day (BID) | ORAL | Status: DC

## 2012-03-20 MED ORDER — DOXYCYCLINE HYCLATE 100 MG PO CAPS: 100.0000 mg | ORAL_CAPSULE | Freq: Two times a day (BID) | ORAL | Status: AC

## 2012-03-20 MED ORDER — OXYCODONE-ACETAMINOPHEN 10-325 MG PO TABS: 1.0000 | ORAL_TABLET | Freq: Four times a day (QID) | ORAL | Status: DC | PRN

## 2012-03-20 NOTE — Progress Notes (Signed)
  Subjective:    Patient ID: Joyce Richardson, female    DOB: January 26, 1946, 66 y.o.   MRN: [id number]  HPI Patient comes in today for SDA for  new and worsening problem evaluation.  C. the last 2 visits she was seen 3 days ago. She states that she cannot sleep and her pain has been difficult the Vicodin that we have given her is not effective. She threw them out. Comes in today for other options.  Doesn't know what to do next. She doesn't think it's got much worse but it is ongoing and not improving and severe when she lays down. It is still localized in the same place as last week. X-rays were done last week which showed no rib fracture or on a plain x-ray and no acute pneumonia. She feels some more shortness of breath. Review of Systems No fever weight loss syncope hemoptysis  No nvd  Has been in touch with her sponsor Past history family history social history reviewed in the electronic medical record.     Objective:   Physical Exam  BP 102/62  Pulse 79  Temp(Src) 98.4 F (36.9 C) (Oral)  Wt 193 lb (87.544 kg)  SpO2 93% Well-developed well-nourished in no acute distress when walking however when lays down she is in acute pain localizes in the right lower anterior lateral chest wall. Abdomen is soft. Chest few wheezes rhonchi bilaterally but breath sounds are equal this time Negative CCE. She is cognitively intact No clubbing cyanosis or edema cognitnio seems intact      Assessment & Plan:  Continued pain right chest wall requiring increasing narcotics she may be tolerant because of previous treatment and history  of underlying problems. Am concerned that she will end up unable to get off med but ha pain at this time  POss exacerbation of copd and add antibiotic empirically  At this point is difficult for her to lay down has difficulty sleeping and she seems to have pain in the right anterior lateral area. Will increase potency of her medication and told her that if she has feels  that don't work in the past if they're controlled she needs to bring him in for a pill count and trade them in.  [place] increase to Percocet 10/325 for now. Need to look into possibility of local injection or pain management to decrease the level of her pain. Also concerned about eating secondary infection with her COPD with increasing sputum. Rule out an antibiotic at this time.  We'll touch base with her orthopedic office or a physical medicine specialist. Her pain specialist to see if  Her other interventions medications for her problem.besides narcotic

## 2012-03-20 NOTE — Telephone Encounter (Signed)
Rx sent to [facility].  

## 2012-03-20 NOTE — Telephone Encounter (Signed)
Pt called req status of doxycycline (VIBRAMYCIN) 100 MG capsule. Pt was told to start on med asap. Pls call in to [facility] today.

## 2012-03-20 NOTE — Telephone Encounter (Signed)
Pt is waiting on abx

## 2012-03-20 NOTE — Patient Instructions (Signed)
Add antibiotic for poss bacterial bronchitis Can tqake percocet if needed with care. Will look into possibilty of seeing physiatrist  Or other to see if a   local injection would help.

## 2012-03-23 DIAGNOSIS — F1911 Other psychoactive substance abuse, in remission: Secondary | ICD-10-CM | POA: Insufficient documentation

## 2012-03-23 DIAGNOSIS — J42 Unspecified chronic bronchitis: Secondary | ICD-10-CM | POA: Insufficient documentation

## 2012-03-23 DIAGNOSIS — J209 Acute bronchitis, unspecified: Secondary | ICD-10-CM | POA: Insufficient documentation

## 2012-03-24 ENCOUNTER — Telehealth: Payer: Self-pay | Admitting: Internal Medicine

## 2012-03-24 DIAGNOSIS — R0789 Other chest pain: Secondary | ICD-10-CM

## 2012-03-24 DIAGNOSIS — S299XXA Unspecified injury of thorax, initial encounter: Secondary | ICD-10-CM

## 2012-03-24 DIAGNOSIS — S61209A Unspecified open wound of unspecified finger without damage to nail, initial encounter: Secondary | ICD-10-CM

## 2012-03-24 MED ORDER — HYDROCODONE-ACETAMINOPHEN 10-325 MG PO TABS: ORAL_TABLET | ORAL | Status: DC

## 2012-03-24 NOTE — Telephone Encounter (Signed)
Server was down for 2 hours middle of day and thus unable to answer messages  Until later.

## 2012-03-24 NOTE — Telephone Encounter (Signed)
Called and spoke with [facility] and described that pt had a chest wall injury and was told that there was not much that could be done there.  The most that could be done for the patient except to bandage her up. Pls advise.

## 2012-03-24 NOTE — Telephone Encounter (Signed)
Addended byBerniece Andreas K on: 03/24/2012 06:20 PM   Modules accepted: Orders

## 2012-03-24 NOTE — Telephone Encounter (Signed)
Pt has question about Percocet and said she will be out of it by tomorrow. Please contact

## 2012-03-24 NOTE — Telephone Encounter (Signed)
Called pt and left a message for return call. 

## 2012-03-24 NOTE — Telephone Encounter (Signed)
Spoke with pt and made aware that message had been received and sent to Dr. Fabian Sharp for approval.  Pt states the rx can be called in to [facility].

## 2012-03-24 NOTE — Telephone Encounter (Signed)
Patient called stating that she is due for a refill of her percocet tomorrow. Please assist.

## 2012-03-24 NOTE — Telephone Encounter (Signed)
Addended by: Azucena Freed on: 03/24/2012 05:17 PM   Modules accepted: Orders

## 2012-03-24 NOTE — Telephone Encounter (Signed)
i advise a pain referral or  Physical medicine consult.  Dr Ethelene Hal / or pain referral

## 2012-03-24 NOTE — Telephone Encounter (Signed)
Pt called and is req to pick up script for Percocet today. Pt is completely out of med. Pls call.

## 2012-03-24 NOTE — Telephone Encounter (Signed)
Patient can't come here to pick up rx.   This pm as late  Still looking fro someone to help her with her painn.  Can do Hydrocodone 10 mg 325 1 po q4-6 hours prn pain Disp 40  No refill at this time  Plan   Referral to a pain specialist or physiatrist.

## 2012-03-24 NOTE — Telephone Encounter (Signed)
Called [facility] and spoke with Casimiro Needle.  Pt's rx has been called in.

## 2012-03-28 ENCOUNTER — Telehealth: Payer: Self-pay | Admitting: *Deleted

## 2012-03-28 ENCOUNTER — Ambulatory Visit: Payer: Medicare Other | Admitting: Internal Medicine

## 2012-03-28 NOTE — Telephone Encounter (Signed)
Pt. Has had 2 episodes of weakness, and numbness in legs in the past 2 weeks.

## 2012-03-30 ENCOUNTER — Encounter: Payer: Self-pay | Admitting: Internal Medicine

## 2012-03-30 ENCOUNTER — Ambulatory Visit (INDEPENDENT_AMBULATORY_CARE_PROVIDER_SITE_OTHER): Payer: Medicare Other | Admitting: Internal Medicine

## 2012-03-30 ENCOUNTER — Other Ambulatory Visit: Payer: Self-pay | Admitting: Internal Medicine

## 2012-03-30 VITALS — BP 120/78 | HR 90 | Temp 99.0°F | Wt 184.0 lb

## 2012-03-30 DIAGNOSIS — S298XXA Other specified injuries of thorax, initial encounter: Secondary | ICD-10-CM

## 2012-03-30 DIAGNOSIS — R0789 Other chest pain: Secondary | ICD-10-CM

## 2012-03-30 DIAGNOSIS — S299XXA Unspecified injury of thorax, initial encounter: Secondary | ICD-10-CM

## 2012-03-30 DIAGNOSIS — H6011 Cellulitis of right external ear: Secondary | ICD-10-CM

## 2012-03-30 DIAGNOSIS — R071 Chest pain on breathing: Secondary | ICD-10-CM

## 2012-03-30 DIAGNOSIS — E119 Type 2 diabetes mellitus without complications: Secondary | ICD-10-CM

## 2012-03-30 DIAGNOSIS — H60399 Other infective otitis externa, unspecified ear: Secondary | ICD-10-CM

## 2012-03-30 MED ORDER — CEFUROXIME AXETIL 500 MG PO TABS: 500.0000 mg | ORAL_TABLET | Freq: Two times a day (BID) | ORAL | Status: AC

## 2012-03-30 MED ORDER — OXYCODONE-ACETAMINOPHEN 10-325 MG PO TABS: 1.0000 | ORAL_TABLET | Freq: Four times a day (QID) | ORAL | Status: DC | PRN

## 2012-03-30 MED ORDER — CEFTRIAXONE SODIUM 1 G IJ SOLR
1.0000 g | Freq: Once | INTRAMUSCULAR | Status: AC
  Administered 2012-03-30: 1 g via INTRAMUSCULAR

## 2012-03-30 NOTE — Patient Instructions (Signed)
We are giving him 1 g of rocephin  today an antibiotic that should last 24 hours and gets many skin germs.  Call tomorrow morning about how it is doing. If it is getting worse we may need to get you to see in ears nose and throat specialist. Otherwise we may just watch carefully after husband observe the area.  Most infections will get better within the week. We'll itching about culture results when they're available but that probably won't be until next week.  Pain med as we discussed and glad you are getting better from this.   Can try lidoderm patch also locally .  To see if helps.

## 2012-03-30 NOTE — Progress Notes (Signed)
  Subjective:    Patient ID: Joyce Richardson, female    DOB: 1946/02/07, 66 y.o.   MRN: [id number]  HPI  Patient comes in today for SDA for  new problem evaluation. The last 10 days she has had an irritated area on her right ear pinna her husband noted yesterday is increasing redness and weepy A. including an antibiotic ointment on it. She has no fever. Doesn't remember trauma there has no drainage.  Has seen Dr. love recently this week who did not really change her medicine wasn't that concerned about her nocturnal symptoms related to the falling out of bed.  Right chest abdominal wall pain still bothering her she states that the hydrocodone makes her nauseous and doesn't really work didn't bring in the leftovers.  He is some better and more tolerable. Asks for more pain medicine not as strong small amount. She still in touch with her sponsors.  She did take the antibiotic doxycycline for about 7 days. She's been off a little bit no history of resistant staph.    Review of Systems No fever chest pain shortness of breath except for the chest wall pain  Past history family history social history reviewed in the electronic medical record.     Objective:   Physical Exam  BP 120/78  Pulse 90  Temp(Src) 99 F (37.2 C) (Oral)  Wt 184 lb (83.462 kg)  SpO2 97% wdwn in nad looks more comfortable than last week.  HEENT right ear   On helix with a 3 mm abrasion ulcer with yellow weeping and redness about 3 cm no abscess and no deformity otherwise.   No adenopathy Chest cta cor rr  Abdomen area of pain still tender but no mass and less excruciating in pain.  Left hand   Almost totally healed.     Assessment & Plan:  Cellulitis early in for right ear cartilage pinna area.  Culture pending; think this is soft tissue but concern about cartilage infection possible.   Rocephin given today patient states that her allergy to penicillin with long time ago when she should be able to take  Keflex. Prescription percent then twice a day for now she just got off doxycycline I doubt resistant staph is the problem. As this was beginning on the doxy.   ceftin  For now close fu and call in am about progress   Pain from injury  RX 10 of the 10 mg percocet and she may cut this in half samples of lidoderm patch to cut and try for 12 hours per day and see if helps use less meds.

## 2012-03-31 ENCOUNTER — Telehealth: Payer: Self-pay

## 2012-03-31 ENCOUNTER — Telehealth: Payer: Self-pay | Admitting: Internal Medicine

## 2012-03-31 DIAGNOSIS — H6011 Cellulitis of right external ear: Secondary | ICD-10-CM | POA: Insufficient documentation

## 2012-03-31 MED ORDER — SULFAMETHOXAZOLE-TRIMETHOPRIM 800-160 MG PO TABS: 1.0000 | ORAL_TABLET | Freq: Two times a day (BID) | ORAL | Status: AC

## 2012-03-31 NOTE — Telephone Encounter (Signed)
Pt's husband called and states that the light and dark red areas are lighter.  The light area around pt's ear was at the edge yesterday and has extended around the outside curve of pt's ear.  Pt's fatigue, nausea, weakness and pain are the same.  Pt is experiencing bad diarrhea (maybe from the antibiotic per pt's husband).  If pt needs to see an ENT pt's husand states Dr. Annalee Genta is a good recommendation.  Pt is wiling to come in if needed. Pls advise.

## 2012-03-31 NOTE — Telephone Encounter (Signed)
So it sound like it is not worse  and may be getting better slightly but the diarrhea is from the antibiotic.  We dont have the culture results in yet but lets change  Antibiotic to  Septra ds take 1 po bid disp 14 .  Need to recheck her next week but be aware of sartuday clinic if needed   thanks

## 2012-03-31 NOTE — Telephone Encounter (Signed)
Opened in error

## 2012-03-31 NOTE — Telephone Encounter (Signed)
Spoke with pt- informed of dr. Rosezella [place] instructions and change in ABX - requested they be sent to [facility]. Discussed Saturday clinic and s/s getting worse. Asked her to call next week and let us know how she is doing - culture should be in by then. Verbalized understanding.

## 2012-03-31 NOTE — Telephone Encounter (Signed)
Called pt as requested - per pt - area of [place] dr. Fabian Sharp saw yesterday improving , but there is redness behind the ear now - still with soreness , low grade temp 99-100. Abx started last night  Giving her stomach pain and diarrhea . Will come in if needed. I told her i would talk with dr. Fabian Sharp - may see ENT today??? May change abx to help with diarrhea. Will call before noon to let her know Dr. Rosezella [place] reccomendations

## 2012-04-02 LAB — WOUND CULTURE
Gram Stain: NONE SEEN
Gram Stain: NONE SEEN

## 2012-04-03 ENCOUNTER — Telehealth: Payer: Self-pay | Admitting: Internal Medicine

## 2012-04-03 MED ORDER — OXYCODONE-ACETAMINOPHEN 10-325 MG PO TABS: 1.0000 | ORAL_TABLET | Freq: Four times a day (QID) | ORAL | Status: AC | PRN

## 2012-04-03 NOTE — Telephone Encounter (Signed)
Pt called req refill on oxyCODONE-acetaminophen (PERCOCET) 10-325 MG per tablet

## 2012-04-03 NOTE — Telephone Encounter (Signed)
Please note  Cancel pain referral for now. Getting better but asked today for  More 10 mg percocet  Had 10 last Thursday  Now asks for  20 pills.  Her ear is reportedly  getting better.

## 2012-04-03 NOTE — Telephone Encounter (Signed)
Patient  Is aware of lab result.  She also understands that she will need an office visit for refills of Percocet

## 2012-04-03 NOTE — Telephone Encounter (Signed)
I spoke with the patient and she is having some improvement with her ear.  It is still a little discolored.  She was requesting Percocet with #20 because she states she lives across town and would more.

## 2012-04-03 NOTE — Telephone Encounter (Signed)
Left message for patient to return our call.

## 2012-04-04 ENCOUNTER — Encounter (HOSPITAL_COMMUNITY): Payer: Self-pay | Admitting: *Deleted

## 2012-04-04 ENCOUNTER — Emergency Department (INDEPENDENT_AMBULATORY_CARE_PROVIDER_SITE_OTHER): Payer: Medicare Other

## 2012-04-04 ENCOUNTER — Emergency Department (HOSPITAL_COMMUNITY)
Admission: EM | Admit: 2012-04-04 | Discharge: 2012-04-04 | Disposition: A | Discharge status: Home / Self Care | Payer: Medicare Other | Source: Home / Self Care | Attending: Family Medicine | Admitting: Family Medicine

## 2012-04-04 DIAGNOSIS — S5290XA Unspecified fracture of unspecified forearm, initial encounter for closed fracture: Secondary | ICD-10-CM

## 2012-04-04 MED ORDER — HYDROCODONE-ACETAMINOPHEN 5-325 MG PO TABS: 2.0000 | ORAL_TABLET | ORAL | Status: AC | PRN

## 2012-04-04 NOTE — Discharge Instructions (Signed)
Forearm Fracture   Your caregiver has diagnosed you as having a broken bone (fracture) of the forearm. This is the part of your arm between the elbow and your wrist. Your forearm is made up of two bones. These are the radius and ulna. A fracture is a break in one or both bones. A cast or splint is used to protect and keep your injured bone from moving. The cast or splint will be on generally for about 5 to 6 weeks, with individual variations.   HOME CARE INSTRUCTIONS   Keep the injured part elevated while sitting or lying down. Keeping the injury above the level of your heart (the center of the chest). This will decrease swelling and pain.   Apply ice to the injury for 15 to 20 minutes, 3 to 4 times per day while awake, for 2 days. Put the ice in a plastic bag and place a thin towel between the bag of ice and your cast or splint.   If you have a plaster or fiberglass cast:   Do not try to scratch the skin under the cast using sharp or pointed objects.   Check the skin around the cast every day. You may put lotion on any red or sore areas.   Keep your cast dry and clean.   If you have a plaster splint:   Wear the splint as directed.   You may loosen the elastic around the splint if your fingers become numb, tingle, or turn cold or blue.   Do not put pressure on any part of your cast or splint. It may break. Rest your cast only on a pillow the first 24 hours until it is fully hardened.   Your cast or splint can be protected during bathing with a plastic bag. Do not lower the cast or splint into water.   Only take over-the-counter or prescription medicines for pain, discomfort, or fever as directed by your caregiver.   SEEK IMMEDIATE MEDICAL CARE IF:   Your cast gets damaged or breaks.   You have more severe pain or swelling than you did before the cast.   Your skin or nails below the injury turn blue or gray, or feel cold or numb.   There is a bad smell or new stains and/or pus like (purulent) drainage coming from  under the cast.   MAKE SURE YOU:   Understand these instructions.   Will watch your condition.   Will get help right away if you are not doing well or get worse.   Document Released: 11/05/2000 Document Revised: 10/28/2011 Document Reviewed: 06/27/2008   [employer] Patient Information 2012 [employer].

## 2012-04-04 NOTE — ED Notes (Signed)
Splint  Applied  By  Ortho  tech

## 2012-04-04 NOTE — ED Notes (Signed)
Pt  Reports  She  Joyce Richardson  This  Am     Walking  Her  Dog   He  Has  Pain  And  Swelling  To  Her  r  Wrist  -  She  Also  Reports  She  Is bing  Treated  For  An ear   Infection  And  Is  On  Anti  Biotics for  Same

## 2012-04-04 NOTE — ED Provider Notes (Signed)
History     CSN: [id number]  Arrival date & time 04/04/12  1207   First MD Initiated Contact with Patient 04/04/12 1357      Chief Complaint  Patient presents with  . Fall    (Consider location/radiation/quality/duration/timing/severity/associated sxs/prior treatment) Patient is a 66 y.o. female presenting with wrist pain. The history is provided by the patient. No language interpreter was used.  Wrist Pain This is a new problem. The current episode started 6 to 12 hours ago. The problem occurs constantly. The problem has been gradually worsening. The symptoms are aggravated by bending. The symptoms are relieved by nothing. She has tried nothing for the symptoms. The treatment provided no relief.  Pt complains of swollen right wrist.   Pt reports she also has a rash on right ear she is suppose to see dermatologist about today  Past Medical History  Diagnosis Date  . DIABETES MELLITUS, TYPE II 08/09/2007  . HYPERLIPIDEMIA 08/09/2007  . LEUKOCYTOSIS 10/20/2010  . ANXIETY 02/09/2010  . ABUSE, ALCOHOL, IN REMISSION 08/09/2007  . TOBACCO USE 08/09/2007  . NARCOTIC ABUSE 12/18/2008  . DEPRESSION 08/09/2007  . HYPERTENSION 09/07/2007  . Chronic diastolic heart failure 02/10/2010  . BRONCHITIS, CHRONIC 05/05/2009  . COPD 02/22/2008  . GERD 08/09/2007  . DIVERTICULOSIS-COLON 10/07/2008  . IBS 09/30/2008  . SEIZURE DISORDER 08/09/2007  . ABDOMINAL PAIN, GENERALIZED, CHRONIC 09/30/2008  . INCREASED BLOOD PRESSURE 08/09/2007  . PERSONAL HX COLONIC POLYPS 10/07/2008  . DIVERTICULITIS, HX OF 08/09/2007  . Personal history of failed moderate sedation 10/07/2008  . DRUG ABUSE, HX OF 12/22/2009    Past Surgical History  Procedure Date  . Appendectomy   . Abdominal hysterectomy   . Cholecystectomy   . Hemorrhoid surgery     No family history on file.  History  Substance Use Topics  . Smoking status: Current Everyday Smoker -- 0.2 packs/day  . Smokeless tobacco: Not on file  . Alcohol Use: No      OB History    Grav Para Term Preterm Abortions TAB SAB Ect Mult Living                  Review of Systems  Musculoskeletal: Positive for joint swelling.  Skin: Positive for rash.  All other systems reviewed and are negative.    Allergies  Other and Penicillins  Home Medications   Current Outpatient Rx  Name Route Sig Dispense Refill  . DOXYCYCLINE HYCLATE 100 MG PO CAPS Oral Take 100 mg by mouth 2 (two) times daily.    . ALBUTEROL SULFATE HFA 108 (90 BASE) MCG/ACT IN AERS Inhalation Inhale 1-2 puffs into the lungs every 4 (four) hours as needed. For shortness of breath or wheezing     . ASPIRIN EC 81 MG PO TBEC Oral Take 81 mg by mouth daily.    . ATORVASTATIN CALCIUM 10 MG PO TABS Oral Take 10 mg by mouth daily.    . BUDESONIDE-FORMOTEROL FUMARATE 160-4.5 MCG/ACT IN AERO Inhalation Inhale 2 puffs into the lungs daily as needed. For congestion    . CEFUROXIME AXETIL 500 MG PO TABS Oral Take 1 tablet (500 mg total) by mouth 2 (two) times daily. 20 tablet 0  . ESTROGENS CONJUGATED 0.625 MG PO TABS Oral Take 0.625 mg by mouth daily.    Marland Kitchen EXENATIDE 10 MCG/0.04ML [place] SOLN Subcutaneous Inject 10 mcg into the skin 2 (two) times daily with a meal.     . FUROSEMIDE 20 MG PO TABS Oral Take 20  mg by mouth daily.      Marland Kitchen HYDROCODONE-ACETAMINOPHEN 10-325 MG PO TABS  Take 1 tablet every 4 to 6 hours prn pain. 40 tablet 0  . INSULIN GLARGINE 100 UNIT/ML [place] SOLN Subcutaneous Inject 30 Units into the skin at bedtime.     Marland Kitchen LEVETIRACETAM 250 MG PO TABS Oral Take 250 mg by mouth 2 (two) times daily.     Marland Kitchen LOSARTAN POTASSIUM 25 MG PO TABS Oral Take 1 tablet (25 mg total) by mouth daily. 90 tablet 3  . MAGNESIUM 200 MG PO TABS Oral Take 1 tablet by mouth daily.     Marland Kitchen METFORMIN HCL 500 MG PO TABS Oral Take 500 mg by mouth daily. 500mg  tid    . MULTIPLE VITAMIN PO Oral Take 1 tablet by mouth daily.     Marland Kitchen OMEPRAZOLE 20 MG PO CPDR Oral Take 20 mg by mouth Daily.     Marland Kitchen ONDANSETRON HCL 4 MG PO TABS  Oral Take 4 mg by mouth 3 (three) times daily as needed. for nausea    . OXYCODONE-ACETAMINOPHEN 10-325 MG PO TABS Oral Take 1 tablet by mouth every 6 (six) hours as needed for pain (can use every 4 ). 20 tablet 0  . POTASSIUM CHLORIDE 10 MEQ PO TBCR Oral Take 10 mEq by mouth daily.      Marland Kitchen PREGABALIN 300 MG PO CAPS Oral Take 600 mg by mouth at bedtime.     Marland Kitchen QUETIAPINE FUMARATE 100 MG PO TABS Oral Take 300 mg by mouth at bedtime.     . SULFAMETHOXAZOLE-TRIMETHOPRIM 800-160 MG PO TABS Oral Take 1 tablet by mouth 2 (two) times daily. 14 tablet 0  . TIOTROPIUM BROMIDE MONOHYDRATE 18 MCG IN CAPS Inhalation Place 1 capsule (18 mcg total) into inhaler and inhale daily. 90 capsule 3  . VENLAFAXINE HCL ER 150 MG PO CP24 Oral Take 130 mg by mouth daily.       BP 106/49  Pulse 86  Temp(Src) 98 F (36.7 C) (Oral)  Resp 16  SpO2 99%  Physical Exam  Nursing note and vitals reviewed. Constitutional: She is oriented to person, place, and time. She appears well-developed and well-nourished.  HENT:  Head: Normocephalic and atraumatic.       Scab blistered area right ear,  ?shingles)  Musculoskeletal: She exhibits edema and tenderness.       Tender right wrist decreased range of motion,  Ns and nv intact  Neurological: She is alert and oriented to person, place, and time.  Skin: Skin is warm.  Psychiatric: She has a normal mood and affect.    ED Course  Procedures (including critical care time)  Labs Reviewed - No data to display No results found.   No diagnosis found.    MDM     Pt placed in a splint.  I advised follow up with Dr. Amanda Pea.  Rx for hydrocodone    Lonia Skinner [place], [place] 04/04/12 1436

## 2012-04-04 NOTE — ED Notes (Signed)
Pt  Wants  To  See  Dr  Cleophas Dunker   She  Is  An  Established  Pt  With  Them       appt  Made  For this  Friday  At 230 pm  With  him

## 2012-04-10 NOTE — ED Provider Notes (Signed)
Medical screening examination/treatment/procedure(s) were performed by resident physician or non-physician practitioner and as supervising physician I was immediately available for consultation/collaboration.   Barkley Bruns MD.    Linna Hoff, MD 04/10/12 [phone]

## 2012-05-13 ENCOUNTER — Emergency Department (HOSPITAL_COMMUNITY): Payer: Medicare Other

## 2012-05-13 ENCOUNTER — Inpatient Hospital Stay (HOSPITAL_COMMUNITY)
Admission: EM | Admit: 2012-05-13 | Discharge: 2012-05-14 | DRG: 092 | Discharge status: Against Medical Advice | Payer: Medicare Other | Attending: Internal Medicine | Admitting: Internal Medicine

## 2012-05-13 ENCOUNTER — Encounter (HOSPITAL_COMMUNITY): Payer: Self-pay | Admitting: Emergency Medicine

## 2012-05-13 DIAGNOSIS — F329 Major depressive disorder, single episode, unspecified: Secondary | ICD-10-CM | POA: Diagnosis present

## 2012-05-13 DIAGNOSIS — Z9119 Patient's noncompliance with other medical treatment and regimen: Secondary | ICD-10-CM

## 2012-05-13 DIAGNOSIS — G47419 Narcolepsy without cataplexy: Principal | ICD-10-CM | POA: Diagnosis present

## 2012-05-13 DIAGNOSIS — E119 Type 2 diabetes mellitus without complications: Secondary | ICD-10-CM | POA: Diagnosis present

## 2012-05-13 DIAGNOSIS — I10 Essential (primary) hypertension: Secondary | ICD-10-CM

## 2012-05-13 DIAGNOSIS — Z860100 Personal history of colon polyps, unspecified: Secondary | ICD-10-CM

## 2012-05-13 DIAGNOSIS — Z888 Allergy status to other drugs, medicaments and biological substances status: Secondary | ICD-10-CM

## 2012-05-13 DIAGNOSIS — E785 Hyperlipidemia, unspecified: Secondary | ICD-10-CM | POA: Diagnosis present

## 2012-05-13 DIAGNOSIS — R55 Syncope and collapse: Secondary | ICD-10-CM

## 2012-05-13 DIAGNOSIS — K219 Gastro-esophageal reflux disease without esophagitis: Secondary | ICD-10-CM | POA: Diagnosis present

## 2012-05-13 DIAGNOSIS — G40909 Epilepsy, unspecified, not intractable, without status epilepticus: Secondary | ICD-10-CM | POA: Diagnosis present

## 2012-05-13 DIAGNOSIS — Z8249 Family history of ischemic heart disease and other diseases of the circulatory system: Secondary | ICD-10-CM

## 2012-05-13 DIAGNOSIS — Z91199 Patient's noncompliance with other medical treatment and regimen due to unspecified reason: Secondary | ICD-10-CM

## 2012-05-13 DIAGNOSIS — K5730 Diverticulosis of large intestine without perforation or abscess without bleeding: Secondary | ICD-10-CM | POA: Diagnosis present

## 2012-05-13 DIAGNOSIS — J4489 Other specified chronic obstructive pulmonary disease: Secondary | ICD-10-CM | POA: Diagnosis present

## 2012-05-13 DIAGNOSIS — F17200 Nicotine dependence, unspecified, uncomplicated: Secondary | ICD-10-CM | POA: Diagnosis present

## 2012-05-13 DIAGNOSIS — J449 Chronic obstructive pulmonary disease, unspecified: Secondary | ICD-10-CM | POA: Diagnosis present

## 2012-05-13 DIAGNOSIS — Z9181 History of falling: Secondary | ICD-10-CM

## 2012-05-13 DIAGNOSIS — R0609 Other forms of dyspnea: Secondary | ICD-10-CM

## 2012-05-13 DIAGNOSIS — K589 Irritable bowel syndrome without diarrhea: Secondary | ICD-10-CM | POA: Diagnosis present

## 2012-05-13 DIAGNOSIS — F411 Generalized anxiety disorder: Secondary | ICD-10-CM | POA: Diagnosis present

## 2012-05-13 DIAGNOSIS — Z880 Allergy status to penicillin: Secondary | ICD-10-CM

## 2012-05-13 DIAGNOSIS — I5032 Chronic diastolic (congestive) heart failure: Secondary | ICD-10-CM | POA: Diagnosis present

## 2012-05-13 DIAGNOSIS — F1011 Alcohol abuse, in remission: Secondary | ICD-10-CM | POA: Diagnosis present

## 2012-05-13 DIAGNOSIS — F3289 Other specified depressive episodes: Secondary | ICD-10-CM | POA: Diagnosis present

## 2012-05-13 HISTORY — DX: Repeated falls: R29.6

## 2012-05-13 LAB — DIFFERENTIAL
Basophils Absolute: 0.1 10*3/uL (ref 0.0–0.1)
Basophils Relative: 0 % (ref 0–1)
Eosinophils Absolute: 0.2 10*3/uL (ref 0.0–0.7)
Neutrophils Relative %: 57 % (ref 43–77)

## 2012-05-13 LAB — COMPREHENSIVE METABOLIC PANEL
ALT: 11 U/L (ref 0–35)
Albumin: 2.7 g/dL — ABNORMAL LOW (ref 3.5–5.2)
Alkaline Phosphatase: 92 U/L (ref 39–117)
Potassium: 3.6 mEq/L (ref 3.5–5.1)
Sodium: 137 mEq/L (ref 135–145)
Total Protein: 6.2 g/dL (ref 6.0–8.3)

## 2012-05-13 LAB — RAPID URINE DRUG SCREEN, HOSP PERFORMED
Amphetamines: NOT DETECTED
Benzodiazepines: NOT DETECTED
Opiates: POSITIVE — AB

## 2012-05-13 LAB — CBC
MCH: 27 pg (ref 26.0–34.0)
MCHC: 32.6 g/dL (ref 30.0–36.0)
Platelets: 255 10*3/uL (ref 150–400)
RBC: 4.37 MIL/uL (ref 3.87–5.11)
RDW: 17.2 % — ABNORMAL HIGH (ref 11.5–15.5)

## 2012-05-13 LAB — TROPONIN I: Troponin I: <0.3 ng/mL (ref <0.30)

## 2012-05-13 LAB — CK TOTAL AND CKMB (NOT AT ARMC): Relative Index: INVALID (ref 0.0–2.5)

## 2012-05-13 LAB — PROTIME-INR: INR: 0.98 (ref 0.00–1.49)

## 2012-05-13 LAB — CARDIAC PANEL(CRET KIN+CKTOT+MB+TROPI): Relative Index: INVALID (ref 0.0–2.5)

## 2012-05-13 LAB — APTT: aPTT: 36 seconds (ref 24–37)

## 2012-05-13 MED ORDER — MORPHINE SULFATE 4 MG/ML IJ SOLN
4.0000 mg | Freq: Once | INTRAMUSCULAR | Status: AC
  Administered 2012-05-13: 4 mg via INTRAVENOUS
  Filled 2012-05-13: qty 1

## 2012-05-13 MED ORDER — ATORVASTATIN CALCIUM 10 MG PO TABS
10.0000 mg | ORAL_TABLET | Freq: Every day | ORAL | Status: DC
  Filled 2012-05-13: qty 1

## 2012-05-13 MED ORDER — ONDANSETRON HCL 4 MG/2ML IJ SOLN
4.0000 mg | Freq: Once | INTRAMUSCULAR | Status: AC
  Administered 2012-05-13: 4 mg via INTRAVENOUS
  Filled 2012-05-13: qty 2

## 2012-05-13 MED ORDER — PANTOPRAZOLE SODIUM 40 MG PO TBEC
40.0000 mg | DELAYED_RELEASE_TABLET | Freq: Every day | ORAL | Status: DC
  Administered 2012-05-14: 40 mg via ORAL
  Filled 2012-05-13: qty 1

## 2012-05-13 MED ORDER — FUROSEMIDE 20 MG PO TABS
20.0000 mg | ORAL_TABLET | Freq: Every day | ORAL | Status: DC
  Filled 2012-05-13: qty 1

## 2012-05-13 MED ORDER — VENLAFAXINE HCL ER 150 MG PO CP24
130.0000 mg | ORAL_CAPSULE | Freq: Every day | ORAL | Status: DC
  Filled 2012-05-13: qty 1

## 2012-05-13 MED ORDER — ESTROGENS CONJUGATED 0.625 MG PO TABS
0.6250 mg | ORAL_TABLET | Freq: Every day | ORAL | Status: DC
  Filled 2012-05-13: qty 1

## 2012-05-13 MED ORDER — EXENATIDE 10 MCG/0.04ML ~~LOC~~ SOPN
10.0000 ug | PEN_INJECTOR | Freq: Two times a day (BID) | SUBCUTANEOUS | Status: DC
  Filled 2012-05-13: qty 2.4

## 2012-05-13 MED ORDER — ASPIRIN EC 81 MG PO TBEC
81.0000 mg | DELAYED_RELEASE_TABLET | Freq: Every day | ORAL | Status: DC
  Filled 2012-05-13: qty 1

## 2012-05-13 MED ORDER — LEVETIRACETAM 500 MG PO TABS
500.0000 mg | ORAL_TABLET | Freq: Two times a day (BID) | ORAL | Status: DC
  Administered 2012-05-14: 500 mg via ORAL
  Filled 2012-05-13 (×3): qty 1

## 2012-05-13 MED ORDER — OXYCODONE-ACETAMINOPHEN 7.5-325 MG PO TABS: 1.0000 | ORAL_TABLET | ORAL | Status: DC | PRN

## 2012-05-13 MED ORDER — QUETIAPINE FUMARATE 300 MG PO TABS
300.0000 mg | ORAL_TABLET | Freq: Every day | ORAL | Status: DC
  Filled 2012-05-13: qty 1

## 2012-05-13 MED ORDER — TIOTROPIUM BROMIDE MONOHYDRATE 18 MCG IN CAPS
18.0000 ug | ORAL_CAPSULE | Freq: Every day | RESPIRATORY_TRACT | Status: DC
  Filled 2012-05-13: qty 5

## 2012-05-13 MED ORDER — BUDESONIDE-FORMOTEROL FUMARATE 160-4.5 MCG/ACT IN AERO
2.0000 | INHALATION_SPRAY | Freq: Two times a day (BID) | RESPIRATORY_TRACT | Status: DC
  Filled 2012-05-13: qty 6

## 2012-05-13 MED ORDER — INSULIN GLARGINE 100 UNIT/ML ~~LOC~~ SOLN
30.0000 [IU] | Freq: Every day | SUBCUTANEOUS | Status: DC
  Administered 2012-05-14: 30 [IU] via SUBCUTANEOUS

## 2012-05-13 MED ORDER — PREGABALIN 100 MG PO CAPS: 600.0000 mg | ORAL_CAPSULE | Freq: Every day | ORAL | Status: DC

## 2012-05-13 MED ORDER — POTASSIUM CHLORIDE CRYS ER 10 MEQ PO TBCR
10.0000 meq | EXTENDED_RELEASE_TABLET | Freq: Every day | ORAL | Status: DC
  Filled 2012-05-13: qty 1

## 2012-05-13 MED ORDER — ONDANSETRON HCL 4 MG PO TABS: 4.0000 mg | ORAL_TABLET | Freq: Three times a day (TID) | ORAL | Status: DC | PRN

## 2012-05-13 MED ORDER — MAGNESIUM OXIDE 400 (241.3 MG) MG PO TABS
200.0000 mg | ORAL_TABLET | Freq: Every day | ORAL | Status: DC
  Filled 2012-05-13: qty 0.5

## 2012-05-13 MED ORDER — INSULIN ASPART 100 UNIT/ML ~~LOC~~ SOLN: 0.0000 [IU] | Freq: Every day | SUBCUTANEOUS | Status: DC

## 2012-05-13 MED ORDER — SODIUM CHLORIDE 0.9 % IJ SOLN
3.0000 mL | Freq: Two times a day (BID) | INTRAMUSCULAR | Status: DC
  Administered 2012-05-14: 3 mL via INTRAVENOUS

## 2012-05-13 MED ORDER — INSULIN ASPART 100 UNIT/ML ~~LOC~~ SOLN: 0.0000 [IU] | Freq: Three times a day (TID) | SUBCUTANEOUS | Status: DC

## 2012-05-13 MED ORDER — OXYCODONE-ACETAMINOPHEN 5-325 MG PO TABS
1.5000 | ORAL_TABLET | ORAL | Status: DC | PRN
  Administered 2012-05-14: 1.5 via ORAL
  Filled 2012-05-13: qty 2

## 2012-05-13 MED ORDER — METFORMIN HCL ER 750 MG PO TB24
1500.0000 mg | ORAL_TABLET | Freq: Every day | ORAL | Status: DC
  Filled 2012-05-13 (×2): qty 2

## 2012-05-13 MED ORDER — LOSARTAN POTASSIUM 25 MG PO TABS
25.0000 mg | ORAL_TABLET | Freq: Every day | ORAL | Status: DC
  Filled 2012-05-13: qty 1

## 2012-05-13 MED ORDER — MAGNESIUM 200 MG PO TABS: 1.0000 | ORAL_TABLET | Freq: Every day | ORAL | Status: DC

## 2012-05-13 NOTE — H&P (Signed)
Joyce Richardson is an 66 y.o. female.    Joyce Richardson (pcp) Joyce Richardson (cardiologist) Joyce Richardson  (neurologist)   Chief Complaint: near syncope, falls HPI: 66 yo female with dm2, copd, ? Sleep apnea, has c/o frequent loosing of words, near syncope, lightheaded, and falling.  This has been going on for about 6months but worse for the past  2 weeks.  Denies cp, palp, sob, n/v, diarrhea,  Brbpr, black stool, dysuria, hematuria.   Pt this morning was tired of falling where she injured her left elbow due to falling back, felt light headed prior to falling back and hit her left elbow, on grass in the back yard.  In ED Ct brain negative for any acute process, and cardiac markers negative.     Past Medical History  Diagnosis Date  . DIABETES MELLITUS, TYPE II 08/09/2007  . HYPERLIPIDEMIA 08/09/2007  . LEUKOCYTOSIS 10/20/2010  . ANXIETY 02/09/2010  . ABUSE, ALCOHOL, IN REMISSION 08/09/2007  . TOBACCO USE 08/09/2007  . NARCOTIC ABUSE 12/18/2008  . DEPRESSION 08/09/2007  . HYPERTENSION 09/07/2007    pt denies h/o htn  . Chronic diastolic heart failure 02/10/2010    pt denies h/o chf  . BRONCHITIS, CHRONIC 05/05/2009  . COPD 02/22/2008    not on home o2  . GERD 08/09/2007  . DIVERTICULOSIS-COLON 10/07/2008  . IBS 09/30/2008  . SEIZURE DISORDER 08/09/2007  . ABDOMINAL PAIN, GENERALIZED, CHRONIC 09/30/2008  . INCREASED BLOOD PRESSURE 08/09/2007  . PERSONAL HX COLONIC POLYPS 10/07/2008  . DIVERTICULITIS, HX OF 08/09/2007  . Personal history of failed moderate sedation 10/07/2008  . DRUG ABUSE, HX OF 12/22/2009  . Multiple falls     Past Surgical History  Procedure Date  . Appendectomy   . Abdominal hysterectomy   . Cholecystectomy   . Hemorrhoid surgery   . Wrist fracture surgery right wrist    Family History  Problem Relation Age of Onset  . Coronary artery disease Father    Social History:  reports that she has been smoking.  She does not have any smokeless tobacco history on file.  She reports that she does not drink alcohol or use illicit drugs.  Allergies:  Allergies  Allergen Reactions  . Other     All Narcotics- Dependency Alcohol based products  . Penicillins Swelling  . Tegretol (Carbamazepine) Other (See Comments)    unknown     (Not in a hospital admission)  Results for orders placed during the hospital encounter of 05/13/12 (from the past 48 hour(s))  PROTIME-INR     Status: Normal   Collection Time   05/13/12  4:42 PM      Component Value Range Comment   Prothrombin Time 13.2  11.6 - 15.2 seconds    INR 0.98  0.00 - 1.49   APTT     Status: Normal   Collection Time   05/13/12  4:42 PM      Component Value Range Comment   aPTT 36  24 - 37 seconds   CBC     Status: Abnormal   Collection Time   05/13/12  4:42 PM      Component Value Range Comment   WBC 16.4 (*) 4.0 - 10.5 K/uL    RBC 4.37  3.87 - 5.11 MIL/uL    Hemoglobin 11.8 (*) 12.0 - 15.0 g/dL    HCT 16.1  09.6 - 04.5 %    MCV 82.8  78.0 - 100.0 fL    MCH 27.0  26.0 -  34.0 pg    MCHC 32.6  30.0 - 36.0 g/dL    RDW 16.1 (*) 09.6 - 15.5 %    Platelets 255  150 - 400 K/uL   DIFFERENTIAL     Status: Abnormal   Collection Time   05/13/12  4:42 PM      Component Value Range Comment   Neutrophils Relative 57  43 - 77 %    Neutro Abs 9.3 (*) 1.7 - 7.7 K/uL    Lymphocytes Relative 33  12 - 46 %    Lymphs Abs 5.5 (*) 0.7 - 4.0 K/uL    Monocytes Relative 8  3 - 12 %    Monocytes Absolute 1.4 (*) 0.1 - 1.0 K/uL    Eosinophils Relative 1  0 - 5 %    Eosinophils Absolute 0.2  0.0 - 0.7 K/uL    Basophils Relative 0  0 - 1 %    Basophils Absolute 0.1  0.0 - 0.1 K/uL   COMPREHENSIVE METABOLIC PANEL     Status: Abnormal   Collection Time   05/13/12  4:42 PM      Component Value Range Comment   Sodium 137  135 - 145 mEq/L    Potassium 3.6  3.5 - 5.1 mEq/L    Chloride 102  96 - 112 mEq/L    CO2 24  19 - 32 mEq/L    Glucose, Bld 185 (*) 70 - 99 mg/dL    BUN 12  6 - 23 mg/dL    Creatinine, Ser  0.45  0.50 - 1.10 mg/dL    Calcium 8.8  8.4 - 40.9 mg/dL    Total Protein 6.2  6.0 - 8.3 g/dL    Albumin 2.7 (*) 3.5 - 5.2 g/dL    AST 19  0 - 37 U/L    ALT 11  0 - 35 U/L    Alkaline Phosphatase 92  39 - 117 U/L    Total Bilirubin 0.2 (*) 0.3 - 1.2 mg/dL    GFR calc non Af Amer 89 (*) >90 mL/min    GFR calc Af Amer >90  >90 mL/min   CK TOTAL AND CKMB     Status: Normal   Collection Time   05/13/12  4:43 PM      Component Value Range Comment   Total CK 69  7 - 177 U/L    CK, MB 2.4  0.3 - 4.0 ng/mL    Relative Index RELATIVE INDEX IS INVALID  0.0 - 2.5   TROPONIN I     Status: Normal   Collection Time   05/13/12  4:43 PM      Component Value Range Comment   Troponin I <0.30  <0.30 ng/mL   GLUCOSE, CAPILLARY     Status: Abnormal   Collection Time   05/13/12  5:01 PM      Component Value Range Comment   Glucose-Capillary 156 (*) 70 - 99 mg/dL    Dg Elbow Complete Left  05/13/2012  *RADIOLOGY REPORT*  Clinical Data: Fall  LEFT ELBOW - COMPLETE 3+ VIEW  Comparison: None.  Findings: A small elbow joint effusion is present.  Mild degenerative changes are present in the elbow joint.  No obvious fracture or dislocation.  IMPRESSION: Although no obvious fracture or dislocation is   present, a joint effusion is noted therefore occult fracture cannot be excluded. Immobilization and repeat imaging is recommended.  Original Report Authenticated By: Donavan Burnet, M.D.   Ct Head  Wo Contrast  05/13/2012  *RADIOLOGY REPORT*  Clinical Data: Headache and dizziness following fall.  CT HEAD WITHOUT CONTRAST  Technique:  Contiguous axial images were obtained from the base of the skull through the vertex without contrast.  Comparison: 10/10/2009  Findings: No acute intracranial abnormalities are identified, including mass lesion or mass effect, hydrocephalus, extra-axial fluid collection, midline shift, hemorrhage, or acute infarction.  The visualized bony calvarium is unremarkable.  IMPRESSION: No evidence  of acute intracranial abnormality.  Original Report Authenticated By: Rosendo Gros, M.D.    Review of Systems  Constitutional: Negative for fever, chills, weight loss, malaise/fatigue and diaphoresis.  HENT: Negative for hearing loss, ear pain, nosebleeds, congestion, sore throat, neck pain, tinnitus and ear discharge.   Eyes: Negative for blurred vision, double vision, photophobia, pain, discharge and redness.  Respiratory: Negative for cough, sputum production, shortness of breath and stridor.   Cardiovascular: Negative for chest pain, palpitations, orthopnea, claudication, leg swelling and PND.  Gastrointestinal: Negative for heartburn, nausea, vomiting, abdominal pain, diarrhea, constipation, blood in stool and melena.  Genitourinary: Negative for dysuria, urgency, frequency and hematuria.  Musculoskeletal: Positive for back pain and falls. Negative for myalgias and joint pain.  Skin: Negative for itching and rash.  Neurological: Positive for dizziness and headaches. Negative for tingling, tremors, sensory change, speech change, focal weakness, seizures and loss of consciousness.  Endo/Heme/Allergies: Negative for environmental allergies and polydipsia. Does not bruise/bleed easily.  Psychiatric/Behavioral: Negative for depression, suicidal ideas, hallucinations and substance abuse. The patient is not nervous/anxious and does not have insomnia.     Blood pressure 114/58, pulse 80, temperature 99 F (37.2 C), temperature source Oral, resp. rate 18, SpO2 92.00%. Physical Exam  Constitutional: She is oriented to person, place, and time. She appears well-developed and well-nourished. No distress.  HENT:  Head: Normocephalic and atraumatic.  Right Ear: External ear normal.  Left Ear: External ear normal.  Mouth/Throat: No oropharyngeal exudate.  Eyes: Conjunctivae and EOM are normal. Pupils are equal, round, and reactive to light. Right eye exhibits no discharge. Left eye exhibits no  discharge. No scleral icterus.  Neck: Normal range of motion. Neck supple. No JVD present. No tracheal deviation present. No thyromegaly present.  Cardiovascular: Normal rate and regular rhythm.  Exam reveals no gallop and no friction rub.   No murmur heard. Respiratory: Effort normal and breath sounds normal. No stridor. No respiratory distress. She has no wheezes. She has no rales. She exhibits no tenderness.  GI: Soft. Bowel sounds are normal. She exhibits no distension and no mass. There is no tenderness. There is no rebound and no guarding.  Musculoskeletal: Normal range of motion. She exhibits no edema and no tenderness.  Lymphadenopathy:    She has no cervical adenopathy.  Neurological: She is alert and oriented to person, place, and time. She has normal reflexes. She displays normal reflexes. No cranial nerve deficit. She exhibits normal muscle tone. Coordination normal.  Skin: Skin is warm and dry. No rash noted. She is not diaphoretic. No erythema. No pallor.  Psychiatric: She has a normal mood and affect. Her behavior is normal. Judgment and thought content normal.     Assessment/Plan Near syncope, recurrent falls  ,  ddx sleep apnea, partial seizure tia MRI brain noncontast, carotid u/s, cardiac echo [place],  cpk mb, trop i q6h x 3 sets Recommend sleep study, as per Dr. Sandria Manly,   L elbow pain,  Ct scan L elbow r/o occult fracture as suggested by radiology  Dm2:  Continue on byetta, and metformin.  Use ISS  Gerd: cont ppi  Copd:cont spiriva,  Albuterol prn  Pearson Grippe 05/13/2012, 7:42 PM

## 2012-05-13 NOTE — ED Notes (Signed)
Risk analyst of pt complaint, pt is now agreeable to staying. Also advised Dr. Selena Batten - he advised that he intends for the patient to stay.

## 2012-05-13 NOTE — Progress Notes (Signed)
Orthopedic Tech Progress Note Patient Details:  Joyce Richardson Jul 04, 1946 [id number]  Ortho Devices Type of Ortho Device: Long arm splint;Arm foam sling Ortho Device/Splint Location: left arm Ortho Device/Splint Interventions: Application   Joyce Richardson 05/13/2012, 5:57 PM

## 2012-05-13 NOTE — ED Notes (Signed)
Please call husband, Ron @ [phone]

## 2012-05-13 NOTE — ED Notes (Signed)
Pt reports multiple falls within 6 months. Pt reports weakness and  numbness in legs and arms x 6 months. Pt reports her legs gave out on her and she fell today c/o left elbow pain. Pt denies head injury or + LOC.

## 2012-05-13 NOTE — ED Provider Notes (Signed)
History     CSN: [id number]  Arrival date & time 05/13/12  1214   First MD Initiated Contact with Patient 05/13/12 1543      Chief Complaint  Patient presents with  . Fall  . Elbow Pain  . Numbness  near syncope, falls, weakness, confusion  [facility] [place]  (Consider location/radiation/quality/duration/timing/severity/associated sxs/prior treatment) HPI This 66 year old female has several months of progressively worsening daily symptoms of sudden falls, sometimes the falls have a prodrome of lightheadedness and vertigo lasting anywhere from a few seconds at a time to a few minutes at a time but other times she is falls without any prodrome whatsoever, she states she'll be standing or walking and suddenly fall to the ground with weakness in all 4 extremities especially both legs. She does not have any lateralizing weakness. Sometimes she had sudden numbness of her entire body when she falls as well. She used to have these episodes once every few days to once a day or so of the last few months she is having multiple falls every day. Her husband is not sure if he can safely take care of her at home anymore.  The patient also wakes up at night almost every night and it has a feeling as if her entire body is paralyzed and she is unable to feel or move her arms or legs and last from several minutes up to an hour at a time. She also has several months of gradually worsening intermittent confusion with poor short-term memory and poor concentration. She is no change in speech vision swallowing or understanding. She has no headaches neck pain or back pain. She denies chest pain cough shortness breath abdominal pain black or bloody stools. She denies any fainting or head trauma. Today when she fell one of her times and she has fallen several times today, she hurt her left elbow has diffuse left elbow pain is with movement better she keeps her although still she has decreased range of motion to the  elbow although no swelling or deformity to the elbow. She has no distal weakness or numbness to the left arm. She also has a history of seizures presenting his brief staring spells but has not had those in months. She was sent to the ED by her Neuro for evaluation for possible admission to her constellation of unexplained symptoms. Currently is not confused and does not have any weakness or numbness to her arms or legs during the initial examination.  Past Medical History  Diagnosis Date  . HYPERLIPIDEMIA 08/09/2007  . LEUKOCYTOSIS 10/20/2010  . ANXIETY 02/09/2010  . ABUSE, ALCOHOL, IN REMISSION 08/09/2007  . TOBACCO USE 08/09/2007  . NARCOTIC ABUSE 12/18/2008  . DEPRESSION 08/09/2007  . Chronic diastolic heart failure 02/10/2010    pt denies h/o chf  . BRONCHITIS, CHRONIC 05/05/2009  . COPD 02/22/2008    not on home o2  . GERD 08/09/2007  . DIVERTICULOSIS-COLON 10/07/2008  . IBS 09/30/2008  . ABDOMINAL PAIN, GENERALIZED, CHRONIC 09/30/2008  . INCREASED BLOOD PRESSURE 08/09/2007  . PERSONAL HX COLONIC POLYPS 10/07/2008  . DIVERTICULITIS, HX OF 08/09/2007  . Personal history of failed moderate sedation 10/07/2008  . DRUG ABUSE, HX OF 12/22/2009  . Multiple falls   . Pneumonia   . Orthostatic hypotension 05/17/12  . DIABETES MELLITUS, TYPE II 2001  . HYPERTENSION 09/07/2007    05/17/12 pt denies h/o htn; "was on them cause of risk of HTN related to DM"  . Sleep apnea  05/17/12 "scheduled for another sleep study"  . SEIZURE DISORDER 08/09/2007    05/17/12 "superficial visual seizures/Dr. Sandria Manly"    Past Surgical History  Procedure Date  . Hemorrhoid surgery   . Wrist fracture surgery 02/2012    right  . Appendectomy 1970's  . Vaginal hysterectomy   . Cholecystectomy ~ 2005  . Cataract extraction w/ intraocular lens  implant, bilateral 2012    Family History  Problem Relation Age of Onset  . Coronary artery disease Father     History  Substance Use Topics  . Smoking status: Former Smoker  -- 1.0 packs/day for 30 years    Types: Cigarettes    Quit date: 05/17/2012  . Smokeless tobacco: Never Used  . Alcohol Use: No    OB History    Grav Para Term Preterm Abortions TAB SAB Ect Mult Living                  Review of Systems  Constitutional: Negative for fever.       10 Systems reviewed and are negative for acute change except as noted in the HPI.  HENT: Negative for congestion.   Eyes: Negative for discharge and redness.  Respiratory: Negative for cough and shortness of breath.   Cardiovascular: Negative for chest pain.  Gastrointestinal: Negative for vomiting and abdominal pain.  Musculoskeletal: Negative for back pain.  Skin: Negative for rash.  Neurological: Positive for dizziness, weakness and light-headedness. Negative for seizures, syncope, facial asymmetry, speech difficulty, numbness and headaches.  Psychiatric/Behavioral:       No behavior change.    Allergies  Penicillins; Other; and Tegretol  Home Medications   Current Outpatient Rx  Name Route Sig Dispense Refill  . ATORVASTATIN CALCIUM 10 MG PO TABS Oral Take 10 mg by mouth daily.    . BUDESONIDE-FORMOTEROL FUMARATE 160-4.5 MCG/ACT IN AERO Inhalation Inhale 2 puffs into the lungs daily as needed. For congestion    . ESTROGENS CONJUGATED 0.625 MG PO TABS Oral Take 0.625 mg by mouth daily.    Marland Kitchen EXENATIDE 10 MCG/0.04ML [place] SOLN Subcutaneous Inject 10 mcg into the skin 2 (two) times daily with a meal.     . LEVETIRACETAM 500 MG PO TABS Oral Take 500 mg by mouth 2 (two) times daily.    Marland Kitchen LOSARTAN POTASSIUM 25 MG PO TABS Oral Take 1 tablet (25 mg total) by mouth daily. 90 tablet 3  . MAGNESIUM 200 MG PO TABS Oral Take 1 tablet by mouth daily.     Marland Kitchen METFORMIN HCL ER 500 MG PO TB24 Oral Take 1,500 mg by mouth daily with breakfast.    . MULTIPLE VITAMIN PO Oral Take 1 tablet by mouth daily.     Marland Kitchen OMEPRAZOLE 20 MG PO CPDR Oral Take 20 mg by mouth Daily.     Marland Kitchen PREGABALIN 300 MG PO CAPS Oral Take 600 mg by  mouth at bedtime.     . VENLAFAXINE HCL ER 150 MG PO CP24 Oral Take 150 mg by mouth daily.     Marland Kitchen CIPROFLOXACIN HCL 250 MG PO TABS Oral Take 1 tablet (250 mg total) by mouth 2 (two) times daily. 12 tablet 0  . HYDROCODONE-ACETAMINOPHEN 10-325 MG PO TABS Oral Take 1 tablet by mouth every 6 (six) hours as needed. For pain    . INSULIN GLARGINE 100 UNIT/ML [place] SOLN Subcutaneous Inject 10 Units into the skin at bedtime.    Marland Kitchen QUETIAPINE FUMARATE 300 MG PO TABS Oral Take 300 mg by  mouth at bedtime.      BP 102/68  Pulse 74  Temp 97.9 F (36.6 C) (Oral)  Resp 18  Ht 5' 0.5" (1.537 m)  Wt 180 lb 8.9 oz (81.9 kg)  BMI 34.68 kg/m2  SpO2 94%  Physical Exam  Nursing note and vitals reviewed. Constitutional:       Awake, alert, nontoxic appearance with baseline speech for patient.  HENT:  Head: Atraumatic.  Mouth/Throat: No oropharyngeal exudate.  Eyes: EOM are normal. Pupils are equal, round, and reactive to light. Right eye exhibits no discharge. Left eye exhibits no discharge.  Neck: Neck supple.  Cardiovascular: Normal rate and regular rhythm.   No murmur heard. Pulmonary/Chest: Effort normal and breath sounds normal. No stridor. No respiratory distress. She has no wheezes. She has no rales. She exhibits no tenderness.  Abdominal: Soft. Bowel sounds are normal. She exhibits no mass. There is no tenderness. There is no rebound.  Musculoskeletal: She exhibits tenderness.       Baseline ROM, moves extremities with no obvious new focal weakness.  Right arm and both legs are nontender. Left arm is no tenderness to the clavicle shoulder wrist or hand. Left elbow has mild diffuse tenderness with decreased range of motion due to pain. Patient is able to flex to 90 but is not able to fully extend the elbow. Her skin is intact. Her left hand has intact normal light touch as well as capillary refill less than 2 seconds and strength intact in the distributions of the median, radial, and ulnar nerve  function. She also has normal light touch over the left deltoid area. Her neck and back are nontender including no C-spine tenderness.  Lymphadenopathy:    She has no cervical adenopathy.  Neurological:       Awake, alert, cooperative and aware of situation; motor strength 4+/5 bilaterally; sensation normal to light touch bilaterally; peripheral visual fields full to confrontation; no facial asymmetry; tongue midline; major cranial nerves appear intact; no pronator drift, normal finger to nose with right arm and is unable to perform finger to nose testing with left arm due to left elbow injury and left elbow tenderness.  Skin: No rash noted.  Psychiatric: She has a normal mood and affect.    ED Course  Procedures (including critical care time) ECG: Sinus rhythm, ventricular rate 73, normal axis, normal intervals, nonspecific T wave changes, no significant change compared with February 2013 Labs Reviewed  CBC - Abnormal; Notable for the following:    WBC 16.4 (*)     Hemoglobin 11.8 (*)     RDW 17.2 (*)     All other components within normal limits  DIFFERENTIAL - Abnormal; Notable for the following:    Neutro Abs 9.3 (*)     Lymphs Abs 5.5 (*)     Monocytes Absolute 1.4 (*)     All other components within normal limits  COMPREHENSIVE METABOLIC PANEL - Abnormal; Notable for the following:    Glucose, Bld 185 (*)     Albumin 2.7 (*)     Total Bilirubin 0.2 (*)     GFR calc non Af Amer 89 (*)     All other components within normal limits  URINE RAPID DRUG SCREEN (HOSP PERFORMED) - Abnormal; Notable for the following:    Opiates POSITIVE (*)     All other components within normal limits  GLUCOSE, CAPILLARY - Abnormal; Notable for the following:    Glucose-Capillary 156 (*)     All  other components within normal limits  COMPREHENSIVE METABOLIC PANEL - Abnormal; Notable for the following:    Glucose, Bld 143 (*)     Total Protein 5.7 (*)     Albumin 2.6 (*)     Total Bilirubin 0.2  (*)     GFR calc non Af Amer 88 (*)     All other components within normal limits  CBC - Abnormal; Notable for the following:    WBC 13.2 (*)     Hemoglobin 11.2 (*)     HCT 34.9 (*)     RDW 16.9 (*)     All other components within normal limits  DIFFERENTIAL - Abnormal; Notable for the following:    Neutro Abs 8.6 (*)     Monocytes Absolute 1.1 (*)     All other components within normal limits  HEMOGLOBIN A1C - Abnormal; Notable for the following:    Hemoglobin A1C 6.0 (*)     Mean Plasma Glucose 126 (*)     All other components within normal limits  GLUCOSE, CAPILLARY - Abnormal; Notable for the following:    Glucose-Capillary 134 (*)     All other components within normal limits  GLUCOSE, CAPILLARY - Abnormal; Notable for the following:    Glucose-Capillary 112 (*)     All other components within normal limits  PROTIME-INR  APTT  CK TOTAL AND CKMB  TROPONIN I  CARDIAC PANEL(CRET KIN+CKTOT+MB+TROPI)  CARDIAC PANEL(CRET KIN+CKTOT+MB+TROPI)  CARDIAC PANEL(CRET KIN+CKTOT+MB+TROPI)  LAB REPORT - SCANNED   Dg Chest 2 View  05/16/2012  *RADIOLOGY REPORT*  Clinical Data: Hypotension.  CHEST - 2 VIEW  Comparison: 03/16/2012.  Findings: The heart is upper limits of normal and stable.  There is mild tortuosity of the thoracic aorta.  There are chronic bronchitic type lung changes with peribronchial thickening and increased interstitial markings.  No acute infiltrates, edema or effusions.  The bony thorax is intact.  IMPRESSION: Chronic bronchitic type lung changes without acute overlying pulmonary process.  Original Report Authenticated By: P. Loralie Champagne, M.D.     1. Near syncope   2. Other dyspnea and respiratory abnormality   3. Chronic diastolic heart failure   4. Unspecified essential hypertension       MDM  Pt stable in ED with no significant deterioration in condition.Patient / Family / Caregiver informed of clinical course, understand medical decision-making process,  and agree with plan.        Hurman Horn, MD 05/18/12 [phone]

## 2012-05-13 NOTE — ED Notes (Signed)
Patient is unable to void at present. 

## 2012-05-13 NOTE — ED Notes (Signed)
Pt is verbally aggravated because she was unable to get out of bed to turn off her lights. This RN has apologized to the pt and offered to help with anything. Advised PA.

## 2012-05-13 NOTE — ED Provider Notes (Signed)
A provider from Dr. Alena Bills (neurology) office called at 12:00 PM today to inform the ED that [facility] plans to come some time today to the ER. The patient has seizure disorder and has recently  been having episodes of waking up with momentary Bilateral LE paralysis during the night for the past 6-12 months. The provider calling says that a sleep study has been ordered but has not yet been performed. Last night the patient had another episode of the same.  The patient called her cardiologist and neurologist for advice as she is particularly concerned about this episode. Patient advised to come to the ED. Neurology says patient may need TIA and/or cardiac work-up and possibly a neurology consult. Pt also fell and injured her arm.   Dorthula Matas, PA 05/13/12 1243

## 2012-05-13 NOTE — ED Provider Notes (Signed)
History     CSN: [id number]  Arrival date & time 05/13/12  1214   First MD Initiated Contact with Patient 05/13/12 1543      Chief Complaint  Patient presents with  . Fall  . Elbow Pain  . Numbness    (Consider location/radiation/quality/duration/timing/severity/associated sxs/prior treatment) HPI  Past Medical History  Diagnosis Date  . DIABETES MELLITUS, TYPE II 08/09/2007  . HYPERLIPIDEMIA 08/09/2007  . LEUKOCYTOSIS 10/20/2010  . ANXIETY 02/09/2010  . ABUSE, ALCOHOL, IN REMISSION 08/09/2007  . TOBACCO USE 08/09/2007  . NARCOTIC ABUSE 12/18/2008  . DEPRESSION 08/09/2007  . HYPERTENSION 09/07/2007  . Chronic diastolic heart failure 02/10/2010  . BRONCHITIS, CHRONIC 05/05/2009  . COPD 02/22/2008  . GERD 08/09/2007  . DIVERTICULOSIS-COLON 10/07/2008  . IBS 09/30/2008  . SEIZURE DISORDER 08/09/2007  . ABDOMINAL PAIN, GENERALIZED, CHRONIC 09/30/2008  . INCREASED BLOOD PRESSURE 08/09/2007  . PERSONAL HX COLONIC POLYPS 10/07/2008  . DIVERTICULITIS, HX OF 08/09/2007  . Personal history of failed moderate sedation 10/07/2008  . DRUG ABUSE, HX OF 12/22/2009  . Multiple falls     Past Surgical History  Procedure Date  . Appendectomy   . Abdominal hysterectomy   . Cholecystectomy   . Hemorrhoid surgery   . Wrist fracture surgery right wrist    History reviewed. No pertinent family history.  History  Substance Use Topics  . Smoking status: Current Everyday Smoker -- 0.2 packs/day  . Smokeless tobacco: Not on file  . Alcohol Use: No    OB History    Grav Para Term Preterm Abortions TAB SAB Ect Mult Living                  Review of Systems  Allergies  Other; Penicillins; and Tegretol  Home Medications   Current Outpatient Rx  Name Route Sig Dispense Refill  . ASPIRIN EC 81 MG PO TBEC Oral Take 81 mg by mouth daily.    . ATORVASTATIN CALCIUM 10 MG PO TABS Oral Take 10 mg by mouth daily.    . BUDESONIDE-FORMOTEROL FUMARATE 160-4.5 MCG/ACT IN AERO Inhalation Inhale 2  puffs into the lungs daily as needed. For congestion    . ESTROGENS CONJUGATED 0.625 MG PO TABS Oral Take 0.625 mg by mouth daily.    Marland Kitchen EXENATIDE 10 MCG/0.04ML [place] SOLN Subcutaneous Inject 10 mcg into the skin 2 (two) times daily with a meal.     . FUROSEMIDE 20 MG PO TABS Oral Take 20 mg by mouth daily.      Marland Kitchen GLUCAGON (RDNA) 1 MG IJ KIT Intravenous Inject 1 mg into the vein once as needed.    . INSULIN GLARGINE 100 UNIT/ML [place] SOLN Subcutaneous Inject 30 Units into the skin at bedtime.     Marland Kitchen LEVETIRACETAM 500 MG PO TABS Oral Take 500 mg by mouth 2 (two) times daily.    Marland Kitchen LOSARTAN POTASSIUM 25 MG PO TABS Oral Take 1 tablet (25 mg total) by mouth daily. 90 tablet 3  . MAGNESIUM 200 MG PO TABS Oral Take 1 tablet by mouth daily.     Marland Kitchen METFORMIN HCL ER 500 MG PO TB24 Oral Take 1,500 mg by mouth daily with breakfast.    . MULTIPLE VITAMIN PO Oral Take 1 tablet by mouth daily.     Marland Kitchen OMEPRAZOLE 20 MG PO CPDR Oral Take 20 mg by mouth Daily.     Marland Kitchen ONDANSETRON HCL 4 MG PO TABS Oral Take 4 mg by mouth 3 (three) times daily as needed.  for nausea    . OXYCODONE-ACETAMINOPHEN 7.5-325 MG PO TABS Oral Take 1 tablet by mouth every 4 (four) hours as needed. For pain.    Marland Kitchen POTASSIUM CHLORIDE 10 MEQ PO TBCR Oral Take 10 mEq by mouth daily.      Marland Kitchen PREGABALIN 300 MG PO CAPS Oral Take 600 mg by mouth at bedtime.     Marland Kitchen QUETIAPINE FUMARATE 100 MG PO TABS Oral Take 300 mg by mouth at bedtime.     Marland Kitchen TIOTROPIUM BROMIDE MONOHYDRATE 18 MCG IN CAPS Inhalation Place 1 capsule (18 mcg total) into inhaler and inhale daily. 90 capsule 3  . VENLAFAXINE HCL ER 150 MG PO CP24 Oral Take 130 mg by mouth daily.       BP 114/58  Pulse 80  Temp 98.7 F (37.1 C) (Oral)  Resp 18  SpO2 92%  Physical Exam  ED Course  Procedures (including critical care time)  Labs Reviewed  CBC - Abnormal; Notable for the following:    WBC 16.4 (*)     Hemoglobin 11.8 (*)     RDW 17.2 (*)     All other components within normal limits    DIFFERENTIAL - Abnormal; Notable for the following:    Neutro Abs 9.3 (*)     Lymphs Abs 5.5 (*)     Monocytes Absolute 1.4 (*)     All other components within normal limits  COMPREHENSIVE METABOLIC PANEL - Abnormal; Notable for the following:    Glucose, Bld 185 (*)     Albumin 2.7 (*)     Total Bilirubin 0.2 (*)     GFR calc non Af Amer 89 (*)     All other components within normal limits  GLUCOSE, CAPILLARY - Abnormal; Notable for the following:    Glucose-Capillary 156 (*)     All other components within normal limits  PROTIME-INR  APTT  CK TOTAL AND CKMB  TROPONIN I  URINE RAPID DRUG SCREEN (HOSP PERFORMED)   Dg Elbow Complete Left  05/13/2012  *RADIOLOGY REPORT*  Clinical Data: Fall  LEFT ELBOW - COMPLETE 3+ VIEW  Comparison: None.  Findings: A small elbow joint effusion is present.  Mild degenerative changes are present in the elbow joint.  No obvious fracture or dislocation.  IMPRESSION: Although no obvious fracture or dislocation is   present, a joint effusion is noted therefore occult fracture cannot be excluded. Immobilization and repeat imaging is recommended.  Original Report Authenticated By: Donavan Burnet, M.D.   Ct Head Wo Contrast  05/13/2012  *RADIOLOGY REPORT*  Clinical Data: Headache and dizziness following fall.  CT HEAD WITHOUT CONTRAST  Technique:  Contiguous axial images were obtained from the base of the skull through the vertex without contrast.  Comparison: 10/10/2009  Findings: No acute intracranial abnormalities are identified, including mass lesion or mass effect, hydrocephalus, extra-axial fluid collection, midline shift, hemorrhage, or acute infarction.  The visualized bony calvarium is unremarkable.  IMPRESSION: No evidence of acute intracranial abnormality.  Original Report Authenticated By: Rosendo Gros, M.D.     1. Near syncope     5:47 PM Patient from Pod A, handoff from Dr. Fonnie Jarvis. Patient with frequent falls increasing in number. She has had  several falls today. She sustained an elbow injury after a fall today. Dr. Imagene Gurney office suggested she come to ED to rule-out cardiogenic etiology. She has had neuro work-up in past, last MRI 2 yrs ago, and sleep study scheduled for July 15th due to recent  sleep paralysis.    Vital signs reviewed and are as follows: Filed Vitals:   05/13/12 1618  BP: 114/58  Pulse: 80  Temp:   Resp:   BP 114/58  Pulse 80  Temp 98.7 F (37.1 C) (Oral)  Resp 18  SpO2 92%  5:49 PM Exam:  Gen NAD; Heart RRR, nml S1,S2, no m/r/g; Lungs CTAB; Abd soft, NT, no rebound or guarding; Ext 2+ pedal pulses bilaterally, no edema; Neuro CN II-XII grossly intact, sensation normal.   7:31 PM [facility] to admit the patient.    MDM  Admit for evaluation of near-syncope: rule-out cardiogenic vs neurogenic cause.         Renne Crigler, [place] 05/13/12 1932

## 2012-05-13 NOTE — ED Notes (Signed)
Admitting MD at bedside.

## 2012-05-13 NOTE — Plan of Care (Signed)
Problem: Consults Goal: General Medical Patient Education See Patient Education Module for specific education. Outcome: Progressing Patient admitted with near syncopal episode with fall. Goal: Diabetes Guidelines if Diabetic/Glucose > 140 If diabetic or lab glucose is > 140 mg/dl - Initiate Diabetes/Hyperglycemia Guidelines & Document Interventions  Outcome: Completed/Met Date Met:  05/13/12 [facility] WITH [employer]SI COVERAGE

## 2012-05-14 ENCOUNTER — Ambulatory Visit (HOSPITAL_COMMUNITY)
Admission: RE | Admit: 2012-05-14 | Discharge: 2012-05-14 | Disposition: A | Discharge status: Home / Self Care | Payer: Medicare Other | Source: Ambulatory Visit | Attending: Internal Medicine | Admitting: Internal Medicine

## 2012-05-14 ENCOUNTER — Other Ambulatory Visit (HOSPITAL_COMMUNITY): Payer: Self-pay | Admitting: Internal Medicine

## 2012-05-14 ENCOUNTER — Inpatient Hospital Stay (HOSPITAL_COMMUNITY): Payer: Medicare Other

## 2012-05-14 DIAGNOSIS — I10 Essential (primary) hypertension: Secondary | ICD-10-CM

## 2012-05-14 DIAGNOSIS — R55 Syncope and collapse: Secondary | ICD-10-CM

## 2012-05-14 DIAGNOSIS — G47411 Narcolepsy with cataplexy: Secondary | ICD-10-CM

## 2012-05-14 LAB — DIFFERENTIAL
Eosinophils Relative: 1 % (ref 0–5)
Lymphocytes Relative: 26 % (ref 12–46)
Lymphs Abs: 3.4 10*3/uL (ref 0.7–4.0)
Monocytes Absolute: 1.1 10*3/uL — ABNORMAL HIGH (ref 0.1–1.0)

## 2012-05-14 LAB — CBC
HCT: 34.9 % — ABNORMAL LOW (ref 36.0–46.0)
MCV: 82.7 fL (ref 78.0–100.0)
RBC: 4.22 MIL/uL (ref 3.87–5.11)
WBC: 13.2 10*3/uL — ABNORMAL HIGH (ref 4.0–10.5)

## 2012-05-14 LAB — COMPREHENSIVE METABOLIC PANEL
Albumin: 2.6 g/dL — ABNORMAL LOW (ref 3.5–5.2)
BUN: 10 mg/dL (ref 6–23)
Creatinine, Ser: 0.7 mg/dL (ref 0.50–1.10)
Potassium: 3.9 mEq/L (ref 3.5–5.1)
Total Protein: 5.7 g/dL — ABNORMAL LOW (ref 6.0–8.3)

## 2012-05-14 LAB — CARDIAC PANEL(CRET KIN+CKTOT+MB+TROPI)
Relative Index: INVALID (ref 0.0–2.5)
Relative Index: INVALID (ref 0.0–2.5)
Total CK: 54 U/L (ref 7–177)
Troponin I: <0.3 ng/mL (ref <0.30)

## 2012-05-14 NOTE — Discharge Summary (Signed)
Joyce Richardson, [place] Female, 66 y.o., 06-Nov-1946    Discharge diagnosis #1 medical noncompliance signed out AGAINST MEDICAL ADVICE  #2 near syncopal episodes likely secondary to narcolepsy and sleep paralysis  DIABETES MELLITUS, TYPE II  08/09/2007  .  HYPERLIPIDEMIA  08/09/2007  .  LEUKOCYTOSIS  10/20/2010  .  ANXIETY  02/09/2010  .  ABUSE, ALCOHOL, IN REMISSION  08/09/2007  .  TOBACCO USE  08/09/2007  .  NARCOTIC ABUSE  12/18/2008  .  DEPRESSION  08/09/2007  .  HYPERTENSION  09/07/2007  pt denies h/o htn  .  Chronic diastolic heart failure  02/10/2010  pt denies h/o chf  .  BRONCHITIS, CHRONIC  05/05/2009  .  COPD  02/22/2008  not on home o2  .  GERD  08/09/2007  .  DIVERTICULOSIS-COLON  10/07/2008  .  IBS  09/30/2008  .  SEIZURE DISORDER  08/09/2007  .  ABDOMINAL PAIN, GENERALIZED, CHRONIC  09/30/2008  .  INCREASED BLOOD PRESSURE  08/09/2007  .  PERSONAL HX COLONIC POLYPS  10/07/2008  .  DIVERTICULITIS, HX OF  08/09/2007  .  Personal history of failed moderate sedation  10/07/2008  .  DRUG ABUSE, HX OF  12/22/2009  .  Multiple falls   Hospital course  66 year old female with multiple medical problems including diabetes type 2, COPD, sleep apnea comes in with more frequent episodes of dizziness, lightheadedness, falls, this has been ongoing for the last 6 months. Patient has seen Dr. love previously, and he has been trying to arrange for an outpatient sleep study. The patient has requested several times to leave AGAINST MEDICAL ADVICE. She made this request last night at the time of admission.  #1 near syncope, falls a workup was initiated that included an EEG, MRI of the brain, carotid Doppler, and neurology consult was also offered to the patient. The patient had motion artifact during an MRI but not fully complete the study. She kept on insisting that she wants to see Dr. love. I offered her the call the neurologist on call for that group. Then  the patient decided to leave AGAINST MEDICAL ADVICE. The patient was forewarned about the possibilities of injury from recurrent falls, seizures, increased morbidity and mortality the workup was not completed and the patient decided to leave in the presence of her husband

## 2012-05-14 NOTE — Progress Notes (Signed)
Patient arrived to unit from ED via stretcher.  Patient alert, oriented and ambulatory upon admission.  Fall risk and safety plan reviewed with patient, as well as safety video viewed.  Admission vital signs and assessment completed (see flowsheets).  Patient resting comfortably with call light within reach and bed alarm on.  Will continue to monitor.  Joyce Richardson

## 2012-05-14 NOTE — Progress Notes (Signed)
Pt seen by DR  Susie Cassette, Pt insists on signing out ama/ Md aware, Ama form filled out and signed by pt/ Pt left floor at 1115

## 2012-05-14 NOTE — ED Provider Notes (Signed)
Medical screening examination/treatment/procedure(s) were conducted as a shared visit with non-physician practitioner(s) and myself.  I personally evaluated the patient during the encounter  Hurman Horn, MD 05/14/12 1242

## 2012-05-15 ENCOUNTER — Telehealth: Payer: Self-pay | Admitting: Cardiology

## 2012-05-15 ENCOUNTER — Telehealth: Payer: Self-pay | Admitting: Internal Medicine

## 2012-05-15 NOTE — Telephone Encounter (Signed)
Spoke to the pt.  Informed her that we cannot make the pt go or stay at the hospital.  Has an appt in the morning with neurology.  Explained that if the pt needs to be admitted than the appropriate person will be contacted and the pt will be taken care of.

## 2012-05-15 NOTE — Progress Notes (Signed)
Utilization review completed.  

## 2012-05-15 NOTE — Telephone Encounter (Signed)
Printed to discuss with WP. 

## 2012-05-15 NOTE — Telephone Encounter (Signed)
Spoke with pt's husband. Husband concerned because pt is having stroke-like symptoms--poor judgement/falling /dizziness. Pt's husband spoke with Dr Sandria Manly today and has an appt to see Dr Sandria Manly 05/16/12. I encouraged husband to keep pt's appt with Dr Sandria Manly tomorrow for further evaluation.

## 2012-05-15 NOTE — Telephone Encounter (Signed)
Pts spouse called and said that pts health is deteriorating rapidly. Tried to contact [facility] but no one was avail, only gave option to lv vm with tele#. Pt went to ER on Saturday, but tried to sign herself out AMA, but was recommended pt go to Cardiology and had a MRI done, which did not go well. Pts spouse needs for Dr Fabian Sharp to have pt admitted to hospital against pts will, because pt will not stay in hospital on her own. Need nurse to call back asap.

## 2012-05-15 NOTE — Telephone Encounter (Signed)
Per pt spouse call pt was seen at hospital and had some test done she is having dizziness and syncope with some memory loss. Per pt spouse pt signed out AMA but he wants a call to discuss the test that was done in hospital and to find out how soon she needs to be seen

## 2012-05-16 ENCOUNTER — Inpatient Hospital Stay (HOSPITAL_COMMUNITY)
Admission: EM | Admit: 2012-05-16 | Discharge: 2012-05-17 | DRG: 312 | Disposition: A | Discharge status: Home / Self Care | Payer: Medicare Other | Attending: Family Medicine | Admitting: Family Medicine

## 2012-05-16 ENCOUNTER — Emergency Department (HOSPITAL_COMMUNITY): Payer: Medicare Other

## 2012-05-16 ENCOUNTER — Encounter (HOSPITAL_COMMUNITY): Payer: Self-pay

## 2012-05-16 ENCOUNTER — Telehealth: Payer: Self-pay | Admitting: Internal Medicine

## 2012-05-16 DIAGNOSIS — I951 Orthostatic hypotension: Principal | ICD-10-CM | POA: Diagnosis present

## 2012-05-16 DIAGNOSIS — I509 Heart failure, unspecified: Secondary | ICD-10-CM | POA: Diagnosis present

## 2012-05-16 DIAGNOSIS — N390 Urinary tract infection, site not specified: Secondary | ICD-10-CM | POA: Diagnosis present

## 2012-05-16 DIAGNOSIS — Z880 Allergy status to penicillin: Secondary | ICD-10-CM

## 2012-05-16 DIAGNOSIS — Z79899 Other long term (current) drug therapy: Secondary | ICD-10-CM

## 2012-05-16 DIAGNOSIS — G40909 Epilepsy, unspecified, not intractable, without status epilepticus: Secondary | ICD-10-CM | POA: Diagnosis present

## 2012-05-16 DIAGNOSIS — I5032 Chronic diastolic (congestive) heart failure: Secondary | ICD-10-CM | POA: Diagnosis present

## 2012-05-16 DIAGNOSIS — F411 Generalized anxiety disorder: Secondary | ICD-10-CM | POA: Diagnosis present

## 2012-05-16 DIAGNOSIS — F3289 Other specified depressive episodes: Secondary | ICD-10-CM | POA: Diagnosis present

## 2012-05-16 DIAGNOSIS — Z860100 Personal history of colon polyps, unspecified: Secondary | ICD-10-CM

## 2012-05-16 DIAGNOSIS — W19XXXA Unspecified fall, initial encounter: Secondary | ICD-10-CM

## 2012-05-16 DIAGNOSIS — J4489 Other specified chronic obstructive pulmonary disease: Secondary | ICD-10-CM | POA: Diagnosis present

## 2012-05-16 DIAGNOSIS — Z794 Long term (current) use of insulin: Secondary | ICD-10-CM

## 2012-05-16 DIAGNOSIS — Z8249 Family history of ischemic heart disease and other diseases of the circulatory system: Secondary | ICD-10-CM

## 2012-05-16 DIAGNOSIS — E1165 Type 2 diabetes mellitus with hyperglycemia: Secondary | ICD-10-CM

## 2012-05-16 DIAGNOSIS — F1011 Alcohol abuse, in remission: Secondary | ICD-10-CM | POA: Diagnosis present

## 2012-05-16 DIAGNOSIS — F329 Major depressive disorder, single episode, unspecified: Secondary | ICD-10-CM | POA: Diagnosis present

## 2012-05-16 DIAGNOSIS — E119 Type 2 diabetes mellitus without complications: Secondary | ICD-10-CM | POA: Diagnosis present

## 2012-05-16 DIAGNOSIS — I959 Hypotension, unspecified: Secondary | ICD-10-CM

## 2012-05-16 DIAGNOSIS — E782 Mixed hyperlipidemia: Secondary | ICD-10-CM

## 2012-05-16 DIAGNOSIS — F17200 Nicotine dependence, unspecified, uncomplicated: Secondary | ICD-10-CM | POA: Diagnosis present

## 2012-05-16 DIAGNOSIS — Z9181 History of falling: Secondary | ICD-10-CM

## 2012-05-16 DIAGNOSIS — K5730 Diverticulosis of large intestine without perforation or abscess without bleeding: Secondary | ICD-10-CM | POA: Diagnosis present

## 2012-05-16 DIAGNOSIS — E669 Obesity, unspecified: Secondary | ICD-10-CM | POA: Diagnosis present

## 2012-05-16 DIAGNOSIS — J449 Chronic obstructive pulmonary disease, unspecified: Secondary | ICD-10-CM | POA: Diagnosis present

## 2012-05-16 DIAGNOSIS — Z888 Allergy status to other drugs, medicaments and biological substances status: Secondary | ICD-10-CM

## 2012-05-16 DIAGNOSIS — K219 Gastro-esophageal reflux disease without esophagitis: Secondary | ICD-10-CM | POA: Diagnosis present

## 2012-05-16 HISTORY — DX: Sleep apnea, unspecified: G47.30

## 2012-05-16 HISTORY — DX: Orthostatic hypotension: I95.1

## 2012-05-16 LAB — BASIC METABOLIC PANEL
BUN: 15 mg/dL (ref 6–23)
CO2: 25 mEq/L (ref 19–32)
Calcium: 8.4 mg/dL (ref 8.4–10.5)
Chloride: 105 mEq/L (ref 96–112)
Creatinine, Ser: 0.94 mg/dL (ref 0.50–1.10)
GFR calc Af Amer: 71 mL/min — ABNORMAL LOW (ref >90)
GFR calc non Af Amer: 61 mL/min — ABNORMAL LOW (ref >90)
Glucose, Bld: 148 mg/dL — ABNORMAL HIGH (ref 70–99)
Potassium: 3.7 mEq/L (ref 3.5–5.1)
Sodium: 141 mEq/L (ref 135–145)

## 2012-05-16 LAB — CBC
HCT: 36.3 % (ref 36.0–46.0)
Hemoglobin: 11.6 g/dL — ABNORMAL LOW (ref 12.0–15.0)
MCH: 26.4 pg (ref 26.0–34.0)
MCH: 26.2 pg (ref 26.0–34.0)
MCHC: 32 g/dL (ref 30.0–36.0)
MCHC: 31.9 g/dL (ref 30.0–36.0)
MCV: 82.7 fL (ref 78.0–100.0)
Platelets: 300 10*3/uL (ref 150–400)
Platelets: 324 10*3/uL (ref 150–400)
RBC: 4.39 MIL/uL (ref 3.87–5.11)
RDW: 16.8 % — ABNORMAL HIGH (ref 11.5–15.5)
WBC: 12.9 10*3/uL — ABNORMAL HIGH (ref 4.0–10.5)

## 2012-05-16 LAB — URINE MICROSCOPIC-ADD ON

## 2012-05-16 LAB — DIFFERENTIAL
Basophils Absolute: 0 10*3/uL (ref 0.0–0.1)
Basophils Relative: 0 % (ref 0–1)
Eosinophils Absolute: 0.2 10*3/uL (ref 0.0–0.7)
Eosinophils Relative: 2 % (ref 0–5)
Lymphocytes Relative: 38 % (ref 12–46)
Lymphs Abs: 4.9 10*3/uL — ABNORMAL HIGH (ref 0.7–4.0)
Monocytes Absolute: 1 10*3/uL (ref 0.1–1.0)
Monocytes Relative: 8 % (ref 3–12)
Neutro Abs: 6.7 10*3/uL (ref 1.7–7.7)
Neutrophils Relative %: 52 % (ref 43–77)

## 2012-05-16 LAB — URINALYSIS, ROUTINE W REFLEX MICROSCOPIC
Glucose, UA: NEGATIVE mg/dL
Hgb urine dipstick: NEGATIVE
Ketones, ur: 15 mg/dL — AB
Nitrite: POSITIVE — AB
Protein, ur: NEGATIVE mg/dL
Specific Gravity, Urine: 1.024 (ref 1.005–1.030)
Urobilinogen, UA: 1 mg/dL (ref 0.0–1.0)
pH: 5.5 (ref 5.0–8.0)

## 2012-05-16 LAB — GLUCOSE, CAPILLARY
Glucose-Capillary: 136 mg/dL — ABNORMAL HIGH (ref 70–99)
Glucose-Capillary: 207 mg/dL — ABNORMAL HIGH (ref 70–99)
Glucose-Capillary: 98 mg/dL (ref 70–99)

## 2012-05-16 LAB — CREATININE, SERUM: Creatinine, Ser: 0.8 mg/dL (ref 0.50–1.10)

## 2012-05-16 MED ORDER — INSULIN ASPART 100 UNIT/ML ~~LOC~~ SOLN: 0.0000 [IU] | Freq: Every day | SUBCUTANEOUS | Status: DC

## 2012-05-16 MED ORDER — SODIUM CHLORIDE 0.9 % IV BOLUS (SEPSIS)
1000.0000 mL | Freq: Once | INTRAVENOUS | Status: AC
  Administered 2012-05-16: 1000 mL via INTRAVENOUS

## 2012-05-16 MED ORDER — EXENATIDE 10 MCG/0.04ML ~~LOC~~ SOPN: 10.0000 ug | PEN_INJECTOR | Freq: Two times a day (BID) | SUBCUTANEOUS | Status: DC

## 2012-05-16 MED ORDER — QUETIAPINE FUMARATE 300 MG PO TABS
300.0000 mg | ORAL_TABLET | Freq: Every day | ORAL | Status: DC
  Administered 2012-05-16: 300 mg via ORAL
  Filled 2012-05-16 (×3): qty 1

## 2012-05-16 MED ORDER — ENOXAPARIN SODIUM 30 MG/0.3ML ~~LOC~~ SOLN
30.0000 mg | SUBCUTANEOUS | Status: DC
  Administered 2012-05-16: 30 mg via SUBCUTANEOUS
  Filled 2012-05-16 (×2): qty 0.3

## 2012-05-16 MED ORDER — ESTROGENS CONJUGATED 0.625 MG PO TABS
0.6250 mg | ORAL_TABLET | Freq: Every day | ORAL | Status: DC
  Administered 2012-05-16 – 2012-05-17 (×2): 0.625 mg via ORAL
  Filled 2012-05-16 (×2): qty 1

## 2012-05-16 MED ORDER — INSULIN GLARGINE 100 UNIT/ML ~~LOC~~ SOLN
10.0000 [IU] | Freq: Every day | SUBCUTANEOUS | Status: DC
  Administered 2012-05-16: 10 [IU] via SUBCUTANEOUS

## 2012-05-16 MED ORDER — INSULIN ASPART 100 UNIT/ML ~~LOC~~ SOLN
0.0000 [IU] | Freq: Three times a day (TID) | SUBCUTANEOUS | Status: DC
  Administered 2012-05-16: 3 [IU] via SUBCUTANEOUS

## 2012-05-16 MED ORDER — ACETAMINOPHEN 650 MG RE SUPP: 650.0000 mg | Freq: Four times a day (QID) | RECTAL | Status: DC | PRN

## 2012-05-16 MED ORDER — NICOTINE 7 MG/24HR TD PT24
7.0000 mg | MEDICATED_PATCH | Freq: Once | TRANSDERMAL | Status: AC
  Administered 2012-05-16: 7 mg via TRANSDERMAL
  Filled 2012-05-16 (×2): qty 1

## 2012-05-16 MED ORDER — ATORVASTATIN CALCIUM 10 MG PO TABS
10.0000 mg | ORAL_TABLET | Freq: Every day | ORAL | Status: DC
  Administered 2012-05-16 – 2012-05-17 (×2): 10 mg via ORAL
  Filled 2012-05-16 (×2): qty 1

## 2012-05-16 MED ORDER — DEXTROSE 5 % IV SOLN
1.0000 g | INTRAVENOUS | Status: DC
  Administered 2012-05-16: 1 g via INTRAVENOUS
  Filled 2012-05-16 (×2): qty 10

## 2012-05-16 MED ORDER — BUDESONIDE-FORMOTEROL FUMARATE 160-4.5 MCG/ACT IN AERO
2.0000 | INHALATION_SPRAY | Freq: Two times a day (BID) | RESPIRATORY_TRACT | Status: DC
  Administered 2012-05-17: 2 via RESPIRATORY_TRACT
  Filled 2012-05-16 (×2): qty 6

## 2012-05-16 MED ORDER — ONDANSETRON HCL 4 MG PO TABS: 4.0000 mg | ORAL_TABLET | Freq: Four times a day (QID) | ORAL | Status: DC | PRN

## 2012-05-16 MED ORDER — VENLAFAXINE HCL ER 150 MG PO CP24
150.0000 mg | ORAL_CAPSULE | Freq: Every day | ORAL | Status: DC
  Administered 2012-05-16 – 2012-05-17 (×2): 150 mg via ORAL
  Filled 2012-05-16 (×2): qty 1

## 2012-05-16 MED ORDER — NONFORMULARY OR COMPOUNDED ITEM
1.0000 | Freq: Two times a day (BID) | Status: DC
  Filled 2012-05-16 (×2): qty 1

## 2012-05-16 MED ORDER — ONDANSETRON HCL 4 MG/2ML IJ SOLN: 4.0000 mg | Freq: Four times a day (QID) | INTRAMUSCULAR | Status: DC | PRN

## 2012-05-16 MED ORDER — SODIUM CHLORIDE 0.9 % IV SOLN: INTRAVENOUS | Status: DC

## 2012-05-16 MED ORDER — HYDROCODONE-ACETAMINOPHEN 5-325 MG PO TABS
1.0000 | ORAL_TABLET | Freq: Four times a day (QID) | ORAL | Status: DC | PRN
  Administered 2012-05-16 – 2012-05-17 (×2): 1 via ORAL
  Filled 2012-05-16 (×2): qty 1

## 2012-05-16 MED ORDER — PANTOPRAZOLE SODIUM 40 MG PO TBEC
40.0000 mg | DELAYED_RELEASE_TABLET | Freq: Every day | ORAL | Status: DC
  Administered 2012-05-16 – 2012-05-17 (×2): 40 mg via ORAL
  Filled 2012-05-16 (×3): qty 1

## 2012-05-16 MED ORDER — ACETAMINOPHEN 325 MG PO TABS: 650.0000 mg | ORAL_TABLET | Freq: Four times a day (QID) | ORAL | Status: DC | PRN

## 2012-05-16 MED ORDER — DEXTROSE 5 % IV SOLN
1.0000 g | Freq: Once | INTRAVENOUS | Status: AC
  Administered 2012-05-16: 1 g via INTRAVENOUS
  Filled 2012-05-16: qty 10

## 2012-05-16 MED ORDER — LEVETIRACETAM 500 MG PO TABS: 500.0000 mg | ORAL_TABLET | Freq: Two times a day (BID) | ORAL | Status: DC

## 2012-05-16 MED ORDER — SODIUM CHLORIDE 0.9 % IV SOLN
INTRAVENOUS | Status: DC
  Administered 2012-05-16 – 2012-05-17 (×2): via INTRAVENOUS

## 2012-05-16 NOTE — H&P (Signed)
PCP: Berniece Andreas, MD Neurologist: Dr.Love Cardiologist: Dr.Mclean   Chief Complaint:  Low blood pressure  HPI: Joyce Richardson is a 65/F with multiple med problems namely DM, Chronic diastolic CHF, seizure disorder presents to ER from Dr.Love's office with chief complaint of low BP. Patient is a poor historian, who reports multiple falls over the last 2-3 months, most of the times without any prodrome or warning. These falls have  resulted in R arm fracture and now L elbow effusion, she presented to ER on 6/22 after one of these episodes and then left AMA on 6/23. Today she was in Dr.Love's office for cognitive evaluation and evaluation of these falls, when she was noted to have SBP of , which dropped to upon standing She was then sent to the ER where she was noted to be Orthostatic as well and noted to have a UTI and admitted She denies any recent changes in her medications, denies any N/V/D Reports taking all her medication regularly including lasix and losartan     Allergies:   Allergies  Allergen Reactions  . Other     All Narcotics- Dependency Alcohol based products  . Penicillins Swelling  . Tegretol (Carbamazepine) Other (See Comments)    unknown      Past Medical History  Diagnosis Date  . DIABETES MELLITUS, TYPE II 08/09/2007  . HYPERLIPIDEMIA 08/09/2007  . LEUKOCYTOSIS 10/20/2010  . ANXIETY 02/09/2010  . ABUSE, ALCOHOL, IN REMISSION 08/09/2007  . TOBACCO USE 08/09/2007  . NARCOTIC ABUSE 12/18/2008  . DEPRESSION 08/09/2007  . HYPERTENSION 09/07/2007    pt denies h/o htn  . Chronic diastolic heart failure 02/10/2010    pt denies h/o chf  . BRONCHITIS, CHRONIC 05/05/2009  . COPD 02/22/2008    not on home o2  . GERD 08/09/2007  . DIVERTICULOSIS-COLON 10/07/2008  . IBS 09/30/2008  . SEIZURE DISORDER 08/09/2007  . ABDOMINAL PAIN, GENERALIZED, CHRONIC 09/30/2008  . INCREASED BLOOD PRESSURE 08/09/2007  . PERSONAL HX COLONIC POLYPS 10/07/2008  . DIVERTICULITIS, HX  OF 08/09/2007  . Personal history of failed moderate sedation 10/07/2008  . DRUG ABUSE, HX OF 12/22/2009  . Multiple falls   . Pneumonia     Past Surgical History  Procedure Date  . Appendectomy   . Abdominal hysterectomy   . Cholecystectomy   . Hemorrhoid surgery   . Wrist fracture surgery right wrist    Prior to Admission medications   Medication Sig Start Date End Date Taking? Authorizing Provider  atorvastatin (LIPITOR) 10 MG tablet Take 10 mg by mouth daily. 10/26/11  Yes Madelin Headings, MD  budesonide-formoterol (SYMBICORT) 160-4.5 MCG/ACT inhaler Inhale 2 puffs into the lungs daily as needed. For congestion   Yes Historical Provider, MD  estrogens, conjugated, (PREMARIN) 0.625 MG tablet Take 0.625 mg by mouth daily.   Yes Madelin Headings, MD  exenatide (BYETTA) 10 MCG/0.04ML SOLN Inject 10 mcg into the skin 2 (two) times daily with a meal.    Yes Historical Provider, MD  furosemide (LASIX) 20 MG tablet Take 20 mg by mouth daily.     Yes Laurey Morale, MD  HYDROcodone-acetaminophen [facility]) 10-325 MG per tablet Take 1 tablet by mouth every 6 (six) hours as needed. For pain   Yes Historical Provider, MD  insulin glargine (LANTUS) 100 UNIT/ML injection Inject 30 Units into the skin at bedtime.    Yes Historical Provider, MD  levETIRAcetam (KEPPRA) 500 MG tablet Take 500 mg by mouth 2 (two) times daily.   Yes  Historical Provider, MD  losartan (COZAAR) 25 MG tablet Take 1 tablet (25 mg total) by mouth daily. 01/20/12  Yes Laurey Morale, MD  Magnesium 200 MG TABS Take 1 tablet by mouth daily.  10/11/11  Yes Laurey Morale, MD  metFORMIN (GLUCOPHAGE-XR) 500 MG 24 hr tablet Take 1,500 mg by mouth daily with breakfast.   Yes Historical Provider, MD  MULTIPLE VITAMIN PO Take 1 tablet by mouth daily.    Yes Historical Provider, MD  omeprazole (PRILOSEC) 20 MG capsule Take 20 mg by mouth Daily.  09/22/11  Yes Historical Provider, MD  potassium chloride (KLOR-CON) 10 MEQ CR tablet Take 10 mEq  by mouth daily.     Yes Laurey Morale, MD  pregabalin (LYRICA) 300 MG capsule Take 600 mg by mouth at bedtime.    Yes Melvyn Novas, MD  QUEtiapine (SEROQUEL) 300 MG tablet Take 300 mg by mouth at bedtime.   Yes Historical Provider, MD  venlafaxine (EFFEXOR XR) 150 MG 24 hr capsule Take 150 mg by mouth daily.    Yes Historical Provider, MD    Social History:  reports that she has been smoking.  She has never used smokeless tobacco. She reports that she does not drink alcohol or use illicit drugs.  Family History  Problem Relation Age of Onset  . Coronary artery disease Father     Review of Systems:  Constitutional: Denies fever, chills, diaphoresis, appetite change and fatigue.  HEENT: Denies photophobia, eye pain, redness, hearing loss, ear pain, congestion, sore throat, rhinorrhea, sneezing, mouth sores, trouble swallowing, neck pain, neck stiffness and tinnitus.   Respiratory: Denies SOB, DOE, cough, chest tightness,  and wheezing.   Cardiovascular: Denies chest pain, palpitations and leg swelling.  Gastrointestinal: Denies nausea, vomiting, abdominal pain, diarrhea, constipation, blood in stool and abdominal distention.  Genitourinary: Denies dysuria, urgency, frequency, hematuria, flank pain and difficulty urinating.  Musculoskeletal: Denies myalgias, back pain, joint swelling, arthralgias and gait problem.  Skin: Denies pallor, rash and wound.  Neurological: Denies dizziness, seizures, syncope, weakness, light-headedness, numbness and headaches.  Hematological: Denies adenopathy. Easy bruising, personal or family bleeding history  Psychiatric/Behavioral: Denies suicidal ideation, mood changes, confusion, nervousness, sleep disturbance and agitation   Physical Exam: Blood pressure 94/51, pulse 71, temperature 98 F (36.7 C), temperature source Oral, resp. rate 19, height 5' (1.524 m), weight 81 kg (178 lb 9.2 oz), SpO2 92.00%. GEn: AAOx3, no distress CVS: S1S2/RRR, no  m/r/g Lungs: CTAB Abd: obese, soft, NT, no organomegaly, no flank tenderness Ext:no edema/c/c Neuro: moves all ext, no localising signs L arm with elbow swelling, in ACE wrap   Labs on Admission:  Results for orders placed during the hospital encounter of 05/16/12 (from the past 48 hour(s))  CBC     Status: Abnormal   Collection Time   05/16/12 12:10 PM      Component Value Range Comment   WBC 12.9 (*) 4.0 - 10.5 K/uL    RBC 4.39  3.87 - 5.11 MIL/uL    Hemoglobin 11.6 (*) 12.0 - 15.0 g/dL    HCT 04.5  40.9 - 81.1 %    MCV 82.7  78.0 - 100.0 fL    MCH 26.4  26.0 - 34.0 pg    MCHC 32.0  30.0 - 36.0 g/dL    RDW 91.4 (*) 78.2 - 15.5 %    Platelets 300  150 - 400 K/uL   DIFFERENTIAL     Status: Abnormal   Collection Time  05/16/12 12:10 PM      Component Value Range Comment   Neutrophils Relative 52  43 - 77 %    Neutro Abs 6.7  1.7 - 7.7 K/uL    Lymphocytes Relative 38  12 - 46 %    Lymphs Abs 4.9 (*) 0.7 - 4.0 K/uL    Monocytes Relative 8  3 - 12 %    Monocytes Absolute 1.0  0.1 - 1.0 K/uL    Eosinophils Relative 2  0 - 5 %    Eosinophils Absolute 0.2  0.0 - 0.7 K/uL    Basophils Relative 0  0 - 1 %    Basophils Absolute 0.0  0.0 - 0.1 K/uL   BASIC METABOLIC PANEL     Status: Abnormal   Collection Time   05/16/12 12:10 PM      Component Value Range Comment   Sodium 141  135 - 145 mEq/L    Potassium 3.7  3.5 - 5.1 mEq/L    Chloride 105  96 - 112 mEq/L    CO2 25  19 - 32 mEq/L    Glucose, Bld 148 (*) 70 - 99 mg/dL    BUN 15  6 - 23 mg/dL    Creatinine, Ser 1.61  0.50 - 1.10 mg/dL    Calcium 8.4  8.4 - 09.6 mg/dL    GFR calc non Af Amer 61 (*) >90 mL/min    GFR calc Af Amer 71 (*) >90 mL/min   URINALYSIS, ROUTINE W REFLEX MICROSCOPIC     Status: Abnormal   Collection Time   05/16/12 12:22 PM      Component Value Range Comment   Color, Urine AMBER (*) YELLOW BIOCHEMICALS MAY BE AFFECTED BY COLOR   APPearance CLOUDY (*) CLEAR    Specific Gravity, Urine 1.024  1.005 -  1.030    pH 5.5  5.0 - 8.0    Glucose, UA NEGATIVE  NEGATIVE mg/dL    Hgb urine dipstick NEGATIVE  NEGATIVE    Bilirubin Urine SMALL (*) NEGATIVE    Ketones, ur 15 (*) NEGATIVE mg/dL    Protein, ur NEGATIVE  NEGATIVE mg/dL    Urobilinogen, UA 1.0  0.0 - 1.0 mg/dL    Nitrite POSITIVE (*) NEGATIVE    Leukocytes, UA MODERATE (*) NEGATIVE   URINE MICROSCOPIC-ADD ON     Status: Abnormal   Collection Time   05/16/12 12:22 PM      Component Value Range Comment   Squamous Epithelial / LPF FEW (*) RARE    WBC, UA 21-50  <3 WBC/hpf    RBC / HPF 0-2  <3 RBC/hpf    Bacteria, UA MANY (*) RARE    Casts HYALINE CASTS (*) NEGATIVE    Urine-Other MUCOUS PRESENT     GLUCOSE, CAPILLARY     Status: Abnormal   Collection Time   05/16/12 12:30 PM      Component Value Range Comment   Glucose-Capillary 136 (*) 70 - 99 mg/dL   GLUCOSE, CAPILLARY     Status: Abnormal   Collection Time   05/16/12  4:37 PM      Component Value Range Comment   Glucose-Capillary 207 (*) 70 - 99 mg/dL    Comment 1 Documented in Chart      Comment 2 Notify RN       Radiological Exams on Admission: Dg Chest 2 View  05/16/2012  *RADIOLOGY REPORT*  Clinical Data: Hypotension.  CHEST - 2 VIEW  Comparison: 03/16/2012.  Findings: The heart is upper limits of normal and stable.  There is mild tortuosity of the thoracic aorta.  There are chronic bronchitic type lung changes with peribronchial thickening and increased interstitial markings.  No acute infiltrates, edema or effusions.  The bony thorax is intact.  IMPRESSION: Chronic bronchitic type lung changes without acute overlying pulmonary process.  Original Report Authenticated By: P. Loralie Champagne, M.D.    Assessment/Plan 1. Orthostatic Hypotension Could be related to hypovolemia/dehydration/being on Diuretic VS Autonomic neuropathy from Longstanding DM Hydrate with IVF, Discontinue Diuretic/ARB Also check random costisol, TSH  2. UTi- continue ceftraxone, FU Urine  cultures  3. Frequent falls for 2-3 months: could be related gait disorder vs atypical seizures Check EEG PT eval  4. Seizure disorder: continue KEppra at home dose  5. DM on Insulin: resume lantus at lower dose, SSI  6. L elbow traumatic effusion: FU with Dr.Whitfield  7. Chronic diastolic CHF: stable, lasix on hold  8. DVT prophylaxis: lovenox   Time Spent on Admission:  Pragya Lofaso [facility] Pager: [phone] 05/16/2012, 9:00 PM

## 2012-05-16 NOTE — ED Notes (Signed)
MD at bedside. 

## 2012-05-16 NOTE — ED Provider Notes (Signed)
History     CSN: [id number]  Arrival date & time 05/16/12  1023   First MD Initiated Contact with Patient 05/16/12 1147      Chief Complaint  Patient presents with  . Hypotension    Patient is a 66 y.o. female presenting with fall and weakness. The history is provided by the patient and the spouse.  Fall Incident onset: Patient admits to 5-10 falls in the past week. Incident: at various times in various circumstances. Pertinent negatives include no fever, no abdominal pain, no nausea, no vomiting, no headaches and no loss of consciousness.  Weakness Primary symptoms do not include headaches, syncope (near syncope), loss of consciousness, altered mental status, seizures, dizziness, focal weakness, loss of sensation, memory loss, fever, nausea or vomiting. Episode onset: 1 week ago. The symptoms are unchanged. The neurological symptoms are diffuse.  Additional symptoms include weakness. Additional symptoms do not include neck stiffness. Medical issues also include hypertension (on Valsartan and Lasix). Medical issues do not include seizures.    Past Medical History  Diagnosis Date  . DIABETES MELLITUS, TYPE II 08/09/2007  . HYPERLIPIDEMIA 08/09/2007  . LEUKOCYTOSIS 10/20/2010  . ANXIETY 02/09/2010  . ABUSE, ALCOHOL, IN REMISSION 08/09/2007  . TOBACCO USE 08/09/2007  . NARCOTIC ABUSE 12/18/2008  . DEPRESSION 08/09/2007  . HYPERTENSION 09/07/2007    pt denies h/o htn  . Chronic diastolic heart failure 02/10/2010    pt denies h/o chf  . BRONCHITIS, CHRONIC 05/05/2009  . COPD 02/22/2008    not on home o2  . GERD 08/09/2007  . DIVERTICULOSIS-COLON 10/07/2008  . IBS 09/30/2008  . SEIZURE DISORDER 08/09/2007  . ABDOMINAL PAIN, GENERALIZED, CHRONIC 09/30/2008  . INCREASED BLOOD PRESSURE 08/09/2007  . PERSONAL HX COLONIC POLYPS 10/07/2008  . DIVERTICULITIS, HX OF 08/09/2007  . Personal history of failed moderate sedation 10/07/2008  . DRUG ABUSE, HX OF 12/22/2009  . Multiple falls   . Pneumonia       Past Surgical History  Procedure Date  . Appendectomy   . Abdominal hysterectomy   . Cholecystectomy   . Hemorrhoid surgery   . Wrist fracture surgery right wrist    Family History  Problem Relation Age of Onset  . Coronary artery disease Father     History  Substance Use Topics  . Smoking status: Current Everyday Smoker -- 0.5 packs/day for 30 years  . Smokeless tobacco: Never Used  . Alcohol Use: No    OB History    Grav Para Term Preterm Abortions TAB SAB Ect Mult Living                  Review of Systems  Constitutional: Negative for fever, chills, activity change and appetite change.  HENT: Negative for neck pain and neck stiffness.   Respiratory: Negative for cough, chest tightness, shortness of breath and wheezing.   Cardiovascular: Negative for chest pain, palpitations and syncope (near syncope).  Gastrointestinal: Negative for nausea, vomiting, abdominal pain, diarrhea and constipation.  Genitourinary: Negative for dysuria, decreased urine volume and difficulty urinating.  Skin: Positive for wound (fractured left arm). Negative for rash.  Neurological: Positive for weakness. Negative for dizziness, focal weakness, seizures, loss of consciousness, syncope (near syncope), facial asymmetry, light-headedness and headaches.  Psychiatric/Behavioral: Negative for memory loss, confusion, agitation and altered mental status.  All other systems reviewed and are negative.    Allergies  Other; Penicillins; and Tegretol  Home Medications   Current Outpatient Rx  Name Route Sig Dispense Refill  .  ATORVASTATIN CALCIUM 10 MG PO TABS Oral Take 10 mg by mouth daily.    . BUDESONIDE-FORMOTEROL FUMARATE 160-4.5 MCG/ACT IN AERO Inhalation Inhale 2 puffs into the lungs daily as needed. For congestion    . ESTROGENS CONJUGATED 0.625 MG PO TABS Oral Take 0.625 mg by mouth daily.    Marland Kitchen EXENATIDE 10 MCG/0.04ML [place] SOLN Subcutaneous Inject 10 mcg into the skin 2 (two) times daily  with a meal.     . FUROSEMIDE 20 MG PO TABS Oral Take 20 mg by mouth daily.      Marland Kitchen HYDROCODONE-ACETAMINOPHEN 10-325 MG PO TABS Oral Take 1 tablet by mouth every 6 (six) hours as needed. For pain    . INSULIN GLARGINE 100 UNIT/ML [place] SOLN Subcutaneous Inject 30 Units into the skin at bedtime.     Marland Kitchen LEVETIRACETAM 500 MG PO TABS Oral Take 500 mg by mouth 2 (two) times daily.    Marland Kitchen LOSARTAN POTASSIUM 25 MG PO TABS Oral Take 1 tablet (25 mg total) by mouth daily. 90 tablet 3  . MAGNESIUM 200 MG PO TABS Oral Take 1 tablet by mouth daily.     Marland Kitchen METFORMIN HCL ER 500 MG PO TB24 Oral Take 1,500 mg by mouth daily with breakfast.    . MULTIPLE VITAMIN PO Oral Take 1 tablet by mouth daily.     Marland Kitchen OMEPRAZOLE 20 MG PO CPDR Oral Take 20 mg by mouth Daily.     Marland Kitchen POTASSIUM CHLORIDE 10 MEQ PO TBCR Oral Take 10 mEq by mouth daily.      Marland Kitchen PREGABALIN 300 MG PO CAPS Oral Take 600 mg by mouth at bedtime.     Marland Kitchen QUETIAPINE FUMARATE 300 MG PO TABS Oral Take 300 mg by mouth at bedtime.    . VENLAFAXINE HCL ER 150 MG PO CP24 Oral Take 150 mg by mouth daily.       BP 126/58  Pulse 69  Temp 97.8 F (36.6 C) (Oral)  Resp 16  Ht 5' (1.524 m)  Wt 171 lb (77.565 kg)  BMI 33.40 kg/m2  SpO2 97%  Physical Exam  Nursing note and vitals reviewed. Constitutional: She is oriented to person, place, and time. She appears well-developed and well-nourished.  HENT:  Head: Normocephalic and atraumatic.  Right Ear: External ear normal.  Left Ear: External ear normal.  Nose: Nose normal.  Mouth/Throat: Oropharynx is clear and moist. No oropharyngeal exudate.  Eyes: Conjunctivae are normal. Pupils are equal, round, and reactive to light.  Neck: Normal range of motion. Neck supple.  Cardiovascular: Normal rate, regular rhythm, normal heart sounds and intact distal pulses.  Exam reveals no gallop and no friction rub.   No murmur heard. Pulmonary/Chest: Effort normal and breath sounds normal. No respiratory distress. She has no  wheezes. She has no rales. She exhibits no tenderness.  Abdominal: Soft. Bowel sounds are normal. She exhibits no distension and no mass. There is no tenderness. There is no rebound and no guarding.  Musculoskeletal: Normal range of motion. She exhibits tenderness (left arm). She exhibits no edema.       Left arm in splint and sling   Neurological: She is alert and oriented to person, place, and time.  Skin: Skin is warm and dry.  Psychiatric: She has a normal mood and affect. Her behavior is normal. Judgment and thought content normal.    ED Course  Procedures (including critical care time)  Labs Reviewed  CBC - Abnormal; Notable for the following:  WBC 12.9 (*)     Hemoglobin 11.6 (*)     RDW 16.8 (*)     All other components within normal limits  DIFFERENTIAL - Abnormal; Notable for the following:    Lymphs Abs 4.9 (*)     All other components within normal limits  BASIC METABOLIC PANEL  URINALYSIS, ROUTINE W REFLEX MICROSCOPIC   Dg Chest 2 View  05/16/2012  *RADIOLOGY REPORT*  Clinical Data: Hypotension.  CHEST - 2 VIEW  Comparison: 03/16/2012.  Findings: The heart is upper limits of normal and stable.  There is mild tortuosity of the thoracic aorta.  There are chronic bronchitic type lung changes with peribronchial thickening and increased interstitial markings.  No acute infiltrates, edema or effusions.  The bony thorax is intact.  IMPRESSION: Chronic bronchitic type lung changes without acute overlying pulmonary process.  Original Report Authenticated By: P. Loralie Champagne, M.D.     1. Urinary tract infection   2. Falls   3. Hypotension      Date: 05/16/2012  Rate: 73 bpm  Rhythm: normal sinus rhythm  QRS Axis: normal  Intervals: normal  ST/T Wave abnormalities: nonspecific ST changes  Conduction Disutrbances:none  Narrative Interpretation: No evidence of acute ischemia or arrythmia  Old EKG Reviewed: unchanged (05/13/12)    MDM  66 yo F presents for recurrent  falls and symptomatic hypotension. Pt was admitted to the hospital 6/22 but left AMA. She followed-up with her PCP (Dr. Sandria Manly) today where her SBP was in the 60's and 70's with positive orthostatics. Patient denies chest pain, dyspnea, or light-headedness here. No focal neuro complaints or deficits on exam. BP here 72/59-126/58. Orthostatics negative here. IVF provided. Labs c/w UTI. IV Rocephin given. Hospitalist service consulted and will admit.         Clemetine Marker, MD 05/16/12 [phone]

## 2012-05-16 NOTE — ED Notes (Signed)
Pt was brought in by ambulance with complaint of low blood pressure from [facility]. BP was 60/40 standing, 90/60 supine.  Recheck BP by EMS was 120/70. Pt was here a couple days ago for frequent falls but was admitted and left AMA. Pt is A/A/Ox4, skin is warm and dry, respiration is even and unlabored. Pt denies any chest pain, no SOB, no dizziness.

## 2012-05-16 NOTE — Telephone Encounter (Signed)
Received call this morning from Dr. Rosanne Ashing love who evaluated this patient early this morning because of her mental confusion and concern by husband about her situation. She had been in the emergency room  6/22earlier this week  For these sx and left AMA. Dr. Sandria Manly  had noted no focal signs on her exam and her MMSEexam showed a 25/30 however she was hypotensive 90 laying/ sitting and down to 60 standing He did not think it was an acute neurologic event but could have a UTI with an elevated white count.  Disc  About  Arranging  an admission. I called Dr. Smith Robert of the hospital service and based on the reported  clinical status she recommended EMS transport to the emergency room to stabilize because of her hypotension. And that a direct admit was not the safest nor appropriate  clinical route..  I called back Dr. Sandria Manly [phone]  and reported this information; he confirmed that her blood pressure was in the 80s.although she was ambulatory and not falling. Marland Kitchen

## 2012-05-17 ENCOUNTER — Ambulatory Visit (HOSPITAL_COMMUNITY): Payer: Medicare Other

## 2012-05-17 ENCOUNTER — Encounter (HOSPITAL_COMMUNITY): Payer: Self-pay | Admitting: General Practice

## 2012-05-17 DIAGNOSIS — E1165 Type 2 diabetes mellitus with hyperglycemia: Secondary | ICD-10-CM

## 2012-05-17 DIAGNOSIS — I951 Orthostatic hypotension: Secondary | ICD-10-CM

## 2012-05-17 DIAGNOSIS — E782 Mixed hyperlipidemia: Secondary | ICD-10-CM

## 2012-05-17 HISTORY — DX: Orthostatic hypotension: I95.1

## 2012-05-17 LAB — CORTISOL: Cortisol, Plasma: 9 ug/dL

## 2012-05-17 LAB — COMPREHENSIVE METABOLIC PANEL
AST: 17 U/L (ref 0–37)
BUN: 14 mg/dL (ref 6–23)
CO2: 21 mEq/L (ref 19–32)
Calcium: 8.7 mg/dL (ref 8.4–10.5)
Chloride: 108 mEq/L (ref 96–112)
Creatinine, Ser: 0.71 mg/dL (ref 0.50–1.10)
GFR calc Af Amer: >90 mL/min (ref >90)
GFR calc non Af Amer: 87 mL/min — ABNORMAL LOW (ref >90)
Glucose, Bld: 81 mg/dL (ref 70–99)
Total Bilirubin: 0.2 mg/dL — ABNORMAL LOW (ref 0.3–1.2)

## 2012-05-17 LAB — GLUCOSE, CAPILLARY
Glucose-Capillary: 77 mg/dL (ref 70–99)
Glucose-Capillary: 121 mg/dL — ABNORMAL HIGH (ref 70–99)
Glucose-Capillary: 93 mg/dL (ref 70–99)

## 2012-05-17 LAB — CBC
HCT: 35.7 % — ABNORMAL LOW (ref 36.0–46.0)
MCH: 26.5 pg (ref 26.0–34.0)
MCV: 82.8 fL (ref 78.0–100.0)
Platelets: 319 10*3/uL (ref 150–400)
RBC: 4.31 MIL/uL (ref 3.87–5.11)
WBC: 10.3 10*3/uL (ref 4.0–10.5)

## 2012-05-17 MED ORDER — INSULIN GLARGINE 100 UNIT/ML ~~LOC~~ SOLN: 10.0000 [IU] | Freq: Every day | SUBCUTANEOUS | Status: DC

## 2012-05-17 MED ORDER — CIPROFLOXACIN HCL 250 MG PO TABS: 250.0000 mg | ORAL_TABLET | Freq: Two times a day (BID) | ORAL | Status: AC

## 2012-05-17 MED ORDER — ENOXAPARIN SODIUM 40 MG/0.4ML ~~LOC~~ SOLN
40.0000 mg | SUBCUTANEOUS | Status: DC
  Filled 2012-05-17: qty 0.4

## 2012-05-17 NOTE — Progress Notes (Signed)
[facility] PROGRESS NOTE  NONNA RENNINGER ZOX:[id number] DOB: Nov 11, 1946 DOA: 05/16/2012 PCP: Lorretta Harp, MD Neurologist: Avie Echevaria, MD  Cardiologist: Dr.Mclean  Assessment/Plan: 1. Symptomatic orthostatic hypotension: Resolved. Most likely diuretic-induced (patient denies history of lower extremity edema or heart failure exacerbation) exacerbated by poor fluid intake. Doubt autonomic neuropathy from long-standing diabetes mellitus.Cortisol within normal limits. TSH 4 months ago within normal limits. 2. UTI: Empiric Rocephin--change to amoxicillin. Followup culture. 3. Recurrent falls: Present over last 2-3 months. History most suggestive of symptomatic orthostatic hypotension. 4. Report confusion: Resolved. Most likely secondary to urinary tract infection. MRI 6/23 was incomplete but no evidence of acute infarct or mass. 5. History of distal right radial fracture present on admission: Related to recent fall. 6. History of left radial head fracture: Followup with Dr. Cleophas Dunker as an outpatient. 7. Diabetes mellitus type 2, well controlled hemoglobin A1c 6.0.: Well controlled. Hold metformin while in inpatient. Continue Lantus. 8. Seizure disorder: No evidence of ongoing seizure activity. Continue Keppra. No need for EEG. 9. Chronic diastolic congestive heart failure:  Code Status: Full code Family Communication: Discussed with husband at bedside Disposition Plan: Home  Brendia Sacks, MD  [facility] Pager [phone]. If 8PM-8AM, please contact night-coverage at [email or web address], password [facility] 05/17/2012, 11:20 AM  LOS: 1 day   Brief narrative: 66 year old woman presented to her neurologist office for followup. No altered mental status, seizures, memory loss. No focal signs on exam and Mini-Mental status exam was notable for 25/30. No acute neurologic event was suspected.  Chart review:  05/14/2012 hospitalization: History of progressive daily symptoms of  sudden falls with prodrome of lightheadedness and vertigo, whole body numbness, intermittent confusion and poor concentration. However in the emergency department she was not confused and had no weakness or numbness. Admitted for near syncope, recurrent falls. Patient left AGAINST MEDICAL ADVICE.  Consultants:  Physical therapy: No followup needed.  Procedures:  None  HPI/Subjective: Afebrile, vital signs stable. No complaints.  Objective: Filed Vitals:   05/17/12 8295 05/17/12 0637 05/17/12 0738 05/17/12 1100  BP: 119/60 117/57 130/49 148/54  Pulse:   65 65  Temp:   98.1 F (36.7 C) 98.5 F (36.9 C)  TempSrc:   Oral Oral  Resp:   18 18  Height:      Weight:      SpO2:   94% 99%    Intake/Output Summary (Last 24 hours) at 05/17/12 1120 Last data filed at 05/17/12 0738  Gross per 24 hour  Intake 1991.66 ml  Output      0 ml  Net 1991.66 ml    Exam:   General:  Appears calm and comfortable.  Psychiatric: Speech fluent and clear. Alert and oriented to herself, location, month, year. History congruent with that reported by husband.  Cardiovascular: Regular rate and rhythm. No murmur, rub, gallop. No lower extremity edema.  Respiratory: Clear to auscultation bilaterally. No wheezes, rales, rhonchi. Normal respiratory effort.  Neurologic: Cranial nerves 2-12 intact. No dysdiadochokinesis of the right hand. Left hand not tested secondary to fracture of elbow. Toe strength upper and lower joints appears grossly normal.  Data Reviewed: Basic Metabolic Panel:  Lab 05/17/12 6213 05/16/12 2047 05/16/12 1210 05/14/12 0231 05/13/12 1642  NA 142 -- 141 140 137  K 4.0 -- 3.7 3.9 3.6  CL 108 -- 105 104 102  CO2 21 -- 25 26 24   GLUCOSE 81 -- 148* 143* 185*  BUN 14 -- 15 10 12   CREATININE 0.71 0.80 0.94 0.70 0.66  CALCIUM 8.7 -- 8.4 8.7 8.8  MG -- -- -- -- --  PHOS -- -- -- -- --   Liver Function Tests:  Lab 05/17/12 0630 05/14/12 0231 05/13/12 1642  AST 17 17 19   ALT  8 9 11   ALKPHOS 78 87 92  BILITOT 0.2* 0.2* 0.2*  PROT 6.0 5.7* 6.2  ALBUMIN 2.6* 2.6* 2.7*   CBC:  Lab 05/17/12 0630 05/16/12 2047 05/16/12 1210 05/14/12 0231 05/13/12 1642  WBC 10.3 12.4* 12.9* 13.2* 16.4*  NEUTROABS -- -- 6.7 8.6* 9.3*  HGB 11.4* 11.3* 11.6* 11.2* 11.8*  HCT 35.7* 35.4* 36.3 34.9* 36.2  MCV 82.8 82.1 82.7 82.7 82.8  PLT 319 324 300 255 255   Cardiac Enzymes:  Lab 05/14/12 0945 05/14/12 0231 05/13/12 2031 05/13/12 1643  CKTOTAL 59 54 63 69  CKMB 2.1 2.1 2.3 2.4  CKMBINDEX -- -- -- --  TROPONINI <0.30 <0.30 <0.30 <0.30   CBG:  Lab 05/17/12 0743 05/16/12 2128 05/16/12 1637 05/16/12 1230 05/14/12 0611  GLUCAP 77 98 207* 136* 112*   Studies: Dg Chest 2 View  05/16/2012  *RADIOLOGY REPORT*  Clinical Data: Hypotension.  CHEST - 2 VIEW  Comparison: 03/16/2012.  Findings: The heart is upper limits of normal and stable.  There is mild tortuosity of the thoracic aorta.  There are chronic bronchitic type lung changes with peribronchial thickening and increased interstitial markings.  No acute infiltrates, edema or effusions.  The bony thorax is intact.  IMPRESSION: Chronic bronchitic type lung changes without acute overlying pulmonary process.  Original Report Authenticated By: P. Loralie Champagne, M.D.   Ct Head Wo Contrast  05/13/2012  *RADIOLOGY REPORT*  Clinical Data: Headache and dizziness following fall.  CT HEAD WITHOUT CONTRAST  Technique:  Contiguous axial images were obtained from the base of the skull through the vertex without contrast.  Comparison: 10/10/2009  Findings: No acute intracranial abnormalities are identified, including mass lesion or mass effect, hydrocephalus, extra-axial fluid collection, midline shift, hemorrhage, or acute infarction.  The visualized bony calvarium is unremarkable.  IMPRESSION: No evidence of acute intracranial abnormality.  Original Report Authenticated By: Rosendo Gros, M.D.   Mr Brain Ltd W/o Cm  05/14/2012  *RADIOLOGY  REPORT*  Clinical Data: Near syncope  MRI HEAD WITHOUT CONTRAST  Technique:  Multiplanar, multiecho pulse sequences of the brain and surrounding structures were obtained according to standard protocol without intravenous contrast.  Comparison: None.  Findings: Incomplete study.  The patient was not able to tolerate the study.  Good quality diffusion weighted imaging was performed. Motion degraded sagittal T1 and axial T2 images were obtained prior to the patient refusing further imaging.  Negative for acute infarct.  Ventricle size is normal.  No mass lesion is identified.  IMPRESSION: Incomplete study.  No acute infarct.  Original Report Authenticated By: Camelia Phenes, M.D.    Scheduled Meds:   . atorvastatin  10 mg Oral Daily  . budesonide-formoterol  2 puff Inhalation BID  . cefTRIAXone (ROCEPHIN)  IV  1 g Intravenous Once  . cefTRIAXone (ROCEPHIN)  IV  1 g Intravenous Q24H  . enoxaparin  30 mg Subcutaneous Q24H  . estrogens (conjugated)  0.625 mg Oral Daily  . insulin aspart  0-9 Units Subcutaneous TID WC  . insulin glargine  10 Units Subcutaneous QHS  . nicotine  7 mg Transdermal Once  . NONFORMULARY OR COMPOUNDED ITEM 1 each  1 each Oral Q12H  . pantoprazole  40 mg Oral Q1200  . QUEtiapine  300 mg Oral QHS  . sodium chloride  1,000 mL Intravenous Once  . sodium chloride  1,000 mL Intravenous Once  . venlafaxine XR  150 mg Oral Daily  . DISCONTD: sodium chloride   Intravenous STAT  . DISCONTD: exenatide  10 mcg Subcutaneous BID WC  . DISCONTD: insulin aspart  0-5 Units Subcutaneous QHS  . DISCONTD: levETIRAcetam  500 mg Oral BID   Continuous Infusions:   . sodium chloride 100 mL/hr at 05/17/12 7846    Active Problems:  UTI (urinary tract infection)  Orthostatic hypotension  DM (diabetes mellitus)  Chronic diastolic CHF (congestive heart failure)  Seizure disorder

## 2012-05-17 NOTE — Progress Notes (Signed)
Pt was discharged without complications. Pt was educated with instructions and given medications. Pt was told to contact doctor and/or go to the hospital if there is an emergency.

## 2012-05-17 NOTE — Progress Notes (Signed)
Physical Therapy Evaluation Patient Details Name: Joyce Richardson MRN: [id number] DOB: 08/23/1946 Today's Date: 05/17/2012 Time: 1015-1030 PT Time Calculation (min): 15 min  PT Assessment / Plan / Recommendation Clinical Impression  66 yo female admitted with UTI, L elbow effusion, and frequent falls presents to PT at modified independent functional level including stair negotiation;   No further PT needs identified;   Pt plans to follow-up with Dr. Cleophas Dunker re: her elbow effusion    PT Assessment  Patent does not need any further PT services    Follow Up Recommendations  No PT follow up;Supervision - Intermittent    Barriers to Discharge        Equipment Recommendations  None recommended by PT    Recommendations for Other Services Other (comment) (follow-up with Ortho as Outpatient)   Frequency      Precautions / Restrictions Precautions Precautions: Other (comment) (L elbow in splint; effusion post fall) Precaution Comments: Pt will follow-up with Dr. Cleophas Dunker re: L elbow as an outpatient   Pertinent Vitals/Pain no apparent distress       Mobility  Transfers Transfers: Sit to Stand;Stand to Sit Sit to Stand: 7: Independent Stand to Sit: 7: Independent Details for Transfer Assistance: Smooth transitions  Ambulation/Gait Ambulation/Gait Assistance: 6: Modified independent (Device/Increase time) Ambulation Distance (Feet): 150 Feet (greater than) Assistive device: None;Other (Comment) (was also able to push own IV pole with R hand) Ambulation/Gait Assistance Details: Smooth pattern, no loss of balance Gait Pattern: Within Functional Limits Gait velocity: near normal Stairs: Yes Stairs Assistance: 4: Min guard (without phsyical contact) Stair Management Technique: One rail Right;One rail Left;Forwards (R rail ascending/ L rail descending) Number of Stairs: 4     Exercises     PT Diagnosis:    PT Problem List:   PT Treatment Interventions:     PT Goals    Visit Information  Last PT Received On: 05/17/12 Assistance Needed: +1    Subjective Data  Subjective: Reports feels better, attributes recent falls to UTI Patient Stated Goal: home   Prior Functioning  Home Living Lives With: Spouse Available Help at Discharge: Family;Available 24 hours/day Type of Home: House Home Access: Stairs to enter [employer] of Steps: 4 Entrance Stairs-Rails: Right;Left Home Layout: One level Bathroom Shower/Tub: Walk-in shower;Curtain Prior Function Level of Independence: Independent 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    Cognition  Overall Cognitive Status: Appears within functional limits for tasks assessed/performed Arousal/Alertness: Awake/alert Orientation Level: Appears intact for tasks assessed Behavior During Session: [facility] for tasks performed    Extremity/Trunk Assessment Right Upper Extremity Assessment RUE ROM/Strength/Tone: [facility] for tasks assessed Left Upper Extremity Assessment LUE ROM/Strength/Tone: Deficits LUE ROM/Strength/Tone Deficits: Elbow splinted secondary to effusion Right Lower Extremity Assessment RLE ROM/Strength/Tone: Within functional levels Left Lower Extremity Assessment LLE ROM/Strength/Tone: Within functional levels Trunk Assessment Trunk Assessment: Normal   Balance    End of Session PT - End of Session Activity Tolerance: Patient tolerated treatment well Patient left: in bed;with call bell/phone within reach (sitting EOB) Nurse Communication: Mobility status  GP     Olen Pel [place], [place] [phone]  05/17/2012, 12:09 PM

## 2012-05-17 NOTE — Discharge Summary (Signed)
Physician Discharge Summary  Joyce Richardson:[id number] DOB: [birth year] DOA: 05/16/2012  PCP: Lorretta Harp, MD Neurologist: Avie Echevaria, MD   Cardiologist: Dr.Mclean  Admit date: 05/16/2012 Discharge date: 05/17/2012  Recommendations for Outpatient Follow-up:  1. Followup history of hypertension and documented orthostasis this admission. Patient instructed to keep a blood pressure log. 2. Consider outpatient 2-D echocardiogram and bilateral carotid ultrasound--patient deferred this testing to the outpatient setting. 3. Followup clinical status of diastolic dysfunction as patient currently off Lasix. Patient denies any previous exacerbation. 4. Followup radius fracture per orthopedics.   Follow-up Information    Follow up with Lorretta Harp, MD in 1 week.   Contact information:   [address] [place] [address] [phone]       Follow up with Evie Lacks, MD in 2 weeks.   Contact information:   [address] [place] [address] [phone]       Follow up with Marca Ancona, MD in 2 weeks.   Contact information:   [address] [place] [address] [phone]         Discharge Diagnoses:  1. Symptomatic orthostatic hypotension 2. UTI 3. Reported confusion as an outpatient, not seen on this hospitalization 4. Recurrent falls 5. Diabetes mellitus type 2, well controlled 6. History of seizure disorder, stable 7. Compensated diastolic congestive heart failure  Discharge Condition: Improved Disposition: Home  Diet recommendation: Heart healthy  History of present illness:  66 year old woman presented to her neurologist office for followup. No altered mental status, seizures, memory loss. No focal signs on exam and Mini-Mental status exam was notable for 25/30. No acute neurologic event was suspected. She was found to be hypotensive however and  referred for admission.  Hospital Course:  Ms. Frieling was admitted to the medical floor. She was aggressively rehydrated and Lasix was held. Orthostasis has resolved the patient been ambulating without problem. Physical therapy evaluated her and patient requires no further physical therapy followup. Workup for orthostatic hypotension was limited by patient's desire--she wishes to proceed with outpatient 2-D echocardiogram and bilateral carotid ultrasound although I specifically offered the studies as an inpatient. Urinary tract infection be treated with empiric antibiotic therapy. She is a long history of falls which are often brought on by quick positional changes. No reported seizure activity. No focal neurologic symptoms. History most suggestive of symptomatically orthostatic hypotension complicated by diuretic therapy and poor oral intake. Long discussion with her and her husband at bedside (45 minutes). Husband agrees patient has no evidence of confusion at this point. 1. Symptomatic orthostatic hypotension: Resolved. Most likely diuretic-induced (patient denies history of lower extremity edema or heart failure exacerbation) exacerbated by poor fluid intake. Doubt autonomic neuropathy from long-standing diabetes mellitus. Cortisol within normal limits. TSH 4 months ago within normal limits. Encouraged to keep blood pressure log. Hold Lasix. Resume ARB. 2. UTI: Empiric Rocephin--change to ciprofloxacin. Followup culture.  3. Recurrent falls: Present over last 2-3 months. History most suggestive of symptomatic orthostatic hypotension. No physical therapy needs. Followup as an outpatient.  4. Report confusion: Resolved. Most likely secondary to urinary tract infection. MRI 6/23 was incomplete but no evidence of acute infarct or mass.  5. History of distal right radial fracture present on admission: Related to recent fall.  6. History of left radial head fracture: Followup with Dr. Cleophas Dunker as an  outpatient.  7. Diabetes mellitus type 2, well controlled hemoglobin A1c 6.0.: Well controlled.  Hold metformin while in inpatient. Continue Lantus.  8. Seizure disorder: No evidence of ongoing seizure activity. Continue Keppra. No need for EEG.  9. Chronic diastolic congestive heart failure: We will compensated. Patient denies any previous exacerbation or lower remedy edema. This point we will simply hold diuretic. Suggest outpatient followup with her physicians.  Consultants:  Physical therapy: No followup needed.  Procedures:  None  Discharge Instructions  Discharge Orders    Future Appointments: Provider: Department: Dept Phone: Center:   05/18/2012 9:30 AM [facility]  None     Future Orders Please Complete By Expires   Diet - low sodium heart healthy      Increase activity slowly      Discharge instructions      Comments:   Wear bilateral leg stockings. Slow position changes.     Medication List  As of 05/17/2012  5:09 PM   STOP taking these medications         furosemide 20 MG tablet      potassium chloride 10 MEQ CR tablet         TAKE these medications         atorvastatin 10 MG tablet   Commonly known as: LIPITOR   Take 10 mg by mouth daily.      budesonide-formoterol 160-4.5 MCG/ACT inhaler   Commonly known as: SYMBICORT   Inhale 2 puffs into the lungs daily as needed. For congestion      ciprofloxacin 250 MG tablet   Commonly known as: CIPRO   Take 1 tablet (250 mg total) by mouth 2 (two) times daily.      EFFEXOR XR 150 MG 24 hr capsule   Generic drug: venlafaxine XR   Take 150 mg by mouth daily.      estrogens (conjugated) 0.625 MG tablet   Commonly known as: PREMARIN   Take 0.625 mg by mouth daily.      exenatide 10 MCG/0.04ML Soln   Commonly known as: BYETTA   Inject 10 mcg into the skin 2 (two) times daily with a meal.      HYDROcodone-acetaminophen 10-325 MG per tablet   Commonly known as: NORCO   Take 1 tablet by mouth every 6  (six) hours as needed. For pain      insulin glargine 100 UNIT/ML injection   Commonly known as: LANTUS   Inject 10 Units into the skin at bedtime.      levETIRAcetam 500 MG tablet   Commonly known as: KEPPRA   Take 500 mg by mouth 2 (two) times daily.      losartan 25 MG tablet   Commonly known as: COZAAR   Take 1 tablet (25 mg total) by mouth daily.      Magnesium 200 MG Tabs   Take 1 tablet by mouth daily.      metFORMIN 500 MG 24 hr tablet   Commonly known as: GLUCOPHAGE-XR   Take 1,500 mg by mouth daily with breakfast.      MULTIPLE VITAMIN PO   Take 1 tablet by mouth daily.      omeprazole 20 MG capsule   Commonly known as: PRILOSEC   Take 20 mg by mouth Daily.      pregabalin 300 MG capsule   Commonly known as: LYRICA   Take 600 mg by mouth at bedtime.      QUEtiapine 300 MG tablet   Commonly known as: SEROQUEL   Take 300 mg by mouth at bedtime.  The results of significant diagnostics from this hospitalization (including imaging, microbiology, ancillary and laboratory) are listed below for reference.    Significant Diagnostic Studies: Dg Chest 2 View  05/16/2012  *RADIOLOGY REPORT*  Clinical Data: Hypotension.  CHEST - 2 VIEW  Comparison: 03/16/2012.  Findings: The heart is upper limits of normal and stable.  There is mild tortuosity of the thoracic aorta.  There are chronic bronchitic type lung changes with peribronchial thickening and increased interstitial markings.  No acute infiltrates, edema or effusions.  The bony thorax is intact.  IMPRESSION: Chronic bronchitic type lung changes without acute overlying pulmonary process.  Original Report Authenticated By: P. Loralie Champagne, M.D.   Mr Brain Ltd W/o Cm  05/14/2012  *RADIOLOGY REPORT*  Clinical Data: Near syncope  MRI HEAD WITHOUT CONTRAST  Technique:  Multiplanar, multiecho pulse sequences of the brain and surrounding structures were obtained according to standard protocol without intravenous  contrast.  Comparison: None.  Findings: Incomplete study.  The patient was not able to tolerate the study.  Good quality diffusion weighted imaging was performed. Motion degraded sagittal T1 and axial T2 images were obtained prior to the patient refusing further imaging.  Negative for acute infarct.  Ventricle size is normal.  No mass lesion is identified.  IMPRESSION: Incomplete study.  No acute infarct.  Original Report Authenticated By: Camelia Phenes, M.D.   Labs: Basic Metabolic Panel:  Lab 05/17/12 1610 05/16/12 2047 05/16/12 1210 05/14/12 0231 05/13/12 1642  NA 142 -- 141 140 137  K 4.0 -- 3.7 3.9 3.6  CL 108 -- 105 104 102  CO2 21 -- 25 26 24   GLUCOSE 81 -- 148* 143* 185*  BUN 14 -- 15 10 12   CREATININE 0.71 0.80 0.94 0.70 0.66  CALCIUM 8.7 -- 8.4 8.7 8.8  MG -- -- -- -- --  PHOS -- -- -- -- --   Liver Function Tests:  Lab 05/17/12 0630 05/14/12 0231 05/13/12 1642  AST 17 17 19   ALT 8 9 11   ALKPHOS 78 87 92  BILITOT 0.2* 0.2* 0.2*  PROT 6.0 5.7* 6.2  ALBUMIN 2.6* 2.6* 2.7*   CBC:  Lab 05/17/12 0630 05/16/12 2047 05/16/12 1210 05/14/12 0231 05/13/12 1642  WBC 10.3 12.4* 12.9* 13.2* 16.4*  NEUTROABS -- -- 6.7 8.6* 9.3*  HGB 11.4* 11.3* 11.6* 11.2* 11.8*  HCT 35.7* 35.4* 36.3 34.9* 36.2  MCV 82.8 82.1 82.7 82.7 82.8  PLT 319 324 300 255 255   Cardiac Enzymes:  Lab 05/14/12 0945 05/14/12 0231 05/13/12 2031 05/13/12 1643  CKTOTAL 59 54 63 69  CKMB 2.1 2.1 2.3 2.4  CKMBINDEX -- -- -- --  TROPONINI <0.30 <0.30 <0.30 <0.30   CBG:  Lab 05/17/12 1630 05/17/12 1118 05/17/12 0743 05/16/12 2128 05/16/12 1637  GLUCAP 93 121* 77 98 207*    Active Problems:  UTI (urinary tract infection)  Orthostatic hypotension  DM (diabetes mellitus)  Chronic diastolic CHF (congestive heart failure)  Seizure disorder   Time coordinating discharge: 60 minutes.  Signed:  Brendia Sacks, MD [facility] 05/17/2012, 5:09 PM

## 2012-05-17 NOTE — Progress Notes (Signed)
Utilization Review Completed.Joyce Richardson T6/26/2013   

## 2012-05-18 ENCOUNTER — Ambulatory Visit (HOSPITAL_COMMUNITY): Payer: Medicare Other

## 2012-05-18 ENCOUNTER — Other Ambulatory Visit: Payer: Self-pay | Admitting: Neurology

## 2012-05-18 DIAGNOSIS — F29 Unspecified psychosis not due to a substance or known physiological condition: Secondary | ICD-10-CM

## 2012-05-18 DIAGNOSIS — G479 Sleep disorder, unspecified: Secondary | ICD-10-CM

## 2012-05-18 DIAGNOSIS — I951 Orthostatic hypotension: Secondary | ICD-10-CM

## 2012-05-18 DIAGNOSIS — G4733 Obstructive sleep apnea (adult) (pediatric): Secondary | ICD-10-CM

## 2012-05-18 DIAGNOSIS — R269 Unspecified abnormalities of gait and mobility: Secondary | ICD-10-CM

## 2012-05-19 LAB — URINE CULTURE
Colony Count: >=100000
Culture  Setup Time: 201306252159

## 2012-05-25 NOTE — ED Provider Notes (Signed)
I saw and evaluated the patient, reviewed the resident's note and I agree with the findings and plan.  65yF with recurrent falls. Suspect related to orthostasis. W/u also significant for UTI. Plan admit for further tx and eval.  Raeford Razor, MD 05/25/12 1429

## 2012-05-26 ENCOUNTER — Ambulatory Visit
Admission: RE | Admit: 2012-05-26 | Discharge: 2012-05-26 | Disposition: A | Discharge status: Home / Self Care | Payer: Medicare Other | Source: Ambulatory Visit | Attending: Neurology | Admitting: Neurology

## 2012-05-26 DIAGNOSIS — G4733 Obstructive sleep apnea (adult) (pediatric): Secondary | ICD-10-CM

## 2012-05-26 DIAGNOSIS — R269 Unspecified abnormalities of gait and mobility: Secondary | ICD-10-CM

## 2012-05-26 DIAGNOSIS — I951 Orthostatic hypotension: Secondary | ICD-10-CM

## 2012-05-26 DIAGNOSIS — G4730 Sleep apnea, unspecified: Secondary | ICD-10-CM

## 2012-05-26 DIAGNOSIS — G479 Sleep disorder, unspecified: Secondary | ICD-10-CM

## 2012-05-26 DIAGNOSIS — F29 Unspecified psychosis not due to a substance or known physiological condition: Secondary | ICD-10-CM

## 2012-05-26 HISTORY — DX: Sleep apnea, unspecified: G47.30

## 2012-06-06 ENCOUNTER — Other Ambulatory Visit (HOSPITAL_COMMUNITY): Payer: Self-pay | Admitting: Cardiology

## 2012-06-06 DIAGNOSIS — R55 Syncope and collapse: Secondary | ICD-10-CM

## 2012-06-09 ENCOUNTER — Ambulatory Visit (HOSPITAL_COMMUNITY): Payer: Medicare Other | Attending: Cardiovascular Disease | Admitting: Radiology

## 2012-06-09 DIAGNOSIS — I059 Rheumatic mitral valve disease, unspecified: Secondary | ICD-10-CM | POA: Insufficient documentation

## 2012-06-09 DIAGNOSIS — J449 Chronic obstructive pulmonary disease, unspecified: Secondary | ICD-10-CM | POA: Insufficient documentation

## 2012-06-09 DIAGNOSIS — R55 Syncope and collapse: Secondary | ICD-10-CM

## 2012-06-09 DIAGNOSIS — I10 Essential (primary) hypertension: Secondary | ICD-10-CM | POA: Insufficient documentation

## 2012-06-09 DIAGNOSIS — I509 Heart failure, unspecified: Secondary | ICD-10-CM | POA: Insufficient documentation

## 2012-06-09 DIAGNOSIS — J4489 Other specified chronic obstructive pulmonary disease: Secondary | ICD-10-CM | POA: Insufficient documentation

## 2012-06-09 DIAGNOSIS — I959 Hypotension, unspecified: Secondary | ICD-10-CM | POA: Insufficient documentation

## 2012-06-09 DIAGNOSIS — R5381 Other malaise: Secondary | ICD-10-CM | POA: Insufficient documentation

## 2012-06-09 DIAGNOSIS — I517 Cardiomegaly: Secondary | ICD-10-CM | POA: Insufficient documentation

## 2012-06-09 DIAGNOSIS — E669 Obesity, unspecified: Secondary | ICD-10-CM | POA: Insufficient documentation

## 2012-06-09 DIAGNOSIS — Z87891 Personal history of nicotine dependence: Secondary | ICD-10-CM | POA: Insufficient documentation

## 2012-06-09 DIAGNOSIS — E785 Hyperlipidemia, unspecified: Secondary | ICD-10-CM | POA: Insufficient documentation

## 2012-06-09 DIAGNOSIS — F1911 Other psychoactive substance abuse, in remission: Secondary | ICD-10-CM | POA: Insufficient documentation

## 2012-06-09 DIAGNOSIS — E119 Type 2 diabetes mellitus without complications: Secondary | ICD-10-CM | POA: Insufficient documentation

## 2012-06-09 NOTE — Progress Notes (Signed)
Echocardiogram performed.  

## 2012-06-13 ENCOUNTER — Telehealth: Payer: Self-pay | Admitting: Internal Medicine

## 2012-06-13 NOTE — Telephone Encounter (Signed)
Caller: Ronald/Spouse; PCP: Madelin Headings.; CB#: [phone]. Call regarding Needs Rx Sent To Mail Order Pharmacy. Caller requesting 90 day supply of Albuterol with 3 refills be sent to [facility]. # [phone] and fax # [phone]. Caller reports the Symbicort now costs over 600$. Caller can be reached at above # prn.

## 2012-06-14 ENCOUNTER — Encounter: Payer: Self-pay | Admitting: Cardiology

## 2012-06-14 ENCOUNTER — Ambulatory Visit (INDEPENDENT_AMBULATORY_CARE_PROVIDER_SITE_OTHER): Payer: Medicare Other | Admitting: Cardiology

## 2012-06-14 VITALS — BP 118/64 | HR 73 | Ht 60.0 in | Wt 182.0 lb

## 2012-06-14 DIAGNOSIS — I5032 Chronic diastolic (congestive) heart failure: Secondary | ICD-10-CM

## 2012-06-14 DIAGNOSIS — I951 Orthostatic hypotension: Secondary | ICD-10-CM

## 2012-06-14 NOTE — Telephone Encounter (Signed)
Left message on voicemail.  Pt needs notified that albuterol and Symbicort are two different types of inhalers that work in different ways.  Pt should contact insurance to see what is covered.  Will check with [facility] about albuterol if needed after talking to the pt.

## 2012-06-14 NOTE — Patient Instructions (Addendum)
Stop losartan.      Your physician recommends that you schedule a follow-up appointment in: 3 months with Dr Shirlee Latch.

## 2012-06-15 NOTE — Telephone Encounter (Signed)
Spoke to the husband.  She has been using only her albuterol inhaler and says she is breathing fine.  There is no cost for this inhaler.  Would [facility]  Please advise.  She will dc Symbicort.

## 2012-06-16 ENCOUNTER — Other Ambulatory Visit: Payer: Self-pay | Admitting: Family Medicine

## 2012-06-16 MED ORDER — ALBUTEROL SULFATE HFA 108 (90 BASE) MCG/ACT IN AERS: 2.0000 | INHALATION_SPRAY | Freq: Four times a day (QID) | RESPIRATORY_TRACT | Status: DC | PRN

## 2012-06-16 NOTE — Assessment & Plan Note (Signed)
Joyce Richardson has had severe orthostatic hypotension with multiple falls.  She is borderline orthostatic today and BP still runs on the low side.  She is off Lasix.  She may have a component of diabetic autonomic neuropathy.  I am going to have her stop losartan today.  She should continue to wear compression stockings and walk with a cane.

## 2012-06-16 NOTE — Progress Notes (Signed)
Patient ID: Joyce Richardson, female   DOB: 19-Jan-1946, 66 y.o.   MRN: [id number] PCP: Dr. Fabian Sharp  66 yo with history of hyperlipidemia, HTN, and diabetes was seen initially in cardiology clinic for evaluation of shortness of breath. She has COPD with mild obstruction on PFTs and notes frequent wheezing. She had an echo done showing preserved systolic function but moderate diastolic dysfunction. Lexiscan myoview (3/11) was likely normal with anterior attenuation.   I started the patient on a diuretic trial with Lasix. This improved her exertional dyspnea.  Symbicort also helped.  She is an ongoing smoker.   Since I last saw her, patient has had problems with orthostatic hypotension.  She has had multiple falls over the last year and has broken bones.  Most of the time when she falls, she has been lightheaded and has been standing after prolonged lying or sitting.  She gets lightheaded with standing regularly.  She was admitted in 6/13 with falls, orthostatic hypotension, and a UTI.  Lasix was stopped.  Since then, she has not fallen.  She still gets lightheaded with standing on occasion.  She is now using a cane.  She saw neurology recently and had a head MRI/MRA that was relatively unremarkable.  She was told to wear compression stockings.  At home, SBP is now running in the 80s-110s (she brings a list of readings today).  Her BP drops from 110/61 lying to 99/64 standing.    Labs (2/11): LDL 94, HDL 64, creatinine 0.8, BNP 21  Labs (4/11): K 4.1, creatinine 0.9  Labs (5/12): K 4.4, creatinine 0.8, LDL 92, HDL 41 Labs (2/13): TSH normal Labs (6/13): K 4, creatinine 0.7  Allergies (verified):  1) ! Penicillin  2) * All Narcotics  3) * Alchol Base Drugs  4) Hyoscyamine   Past Medical History:  1. HYPERTENSION (ICD-401.9)  2. HYPERLIPIDEMIA (ICD-272.4)  3. ANXIETY (ICD-300.00)  4. 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 Hx of NARCOTIC ABUSE (ICD-305.90): [facility] summer 2010.  7.  DIVERTICULOSIS-COLON (ICD-562.10)  8. IBS (ICD-564.1)  9. COPD (ICD-496): PFTs (3/11) FVC 80%, FEV1 74%, ratio 66%, TLC 90%, DLCO 54%. Mild obstruction, some response to bronchodilator.  10. TOBACCO USE (ICD-305.1)  11. ABUSE, ALCOHOL, IN REMISSION (ICD-305.03)  12. SEIZURE DISORDER (ICD-780.39)  13. GERD (ICD-530.81)  14. DIABETES MELLITUS, TYPE II (ICD-250.00)  15. DEPRESSION (ICD-311)per dr. Nolen Mu  16. ALLERGIC RHINITIS (ICD-477.9)  17. Hyponatremia with tegretol  18. Diastolic CHF: Echo (3/11) with EF 55-60%, moderate (grade II) diastolic dysfunction, mild LAE.  Echo (7/13) with EF 55-60%, mild MR, mild LVH.  19. Lexiscan myoview (3/11): likely normal with anterior attenuation. EF 68%. Low risk study.  20. Orthostatic hypotension.   Family History:  Family History of father  2 suicides in family  Family History of Colon Cancer:Mothers sister  Family History of Diabetes: Both sides of family, parents, grandparents  Family History of Heart Disease: Both sides of family, parents, grandparents   Social History:  Married 3rd time  Current Smoker 1 ppd per day since in 68s down now to about 1/2 ppd attends AA  Husband with MS on disability getting worse. in [facility] a good bit  from [place] originally.  Former Engineer, civil (consulting)  Daily Caffeine Use-1/3 cup daily  Illicit Drug Use - no  Patient does not get regular exercise.   ROS: All systems reviewed and negative except as per HPI.   Current Outpatient Prescriptions  Medication Sig Dispense Refill  . atorvastatin (LIPITOR) 10  MG tablet Take 10 mg by mouth daily.      . budesonide-formoterol (SYMBICORT) 160-4.5 MCG/ACT inhaler Inhale 2 puffs into the lungs daily as needed. For congestion      . estrogens, conjugated, (PREMARIN) 0.625 MG tablet Take 0.625 mg by mouth daily.      Marland Kitchen exenatide (BYETTA) 10 MCG/0.04ML SOLN Inject 10 mcg into the skin 2 (two) times daily with a meal.       . HYDROcodone-acetaminophen (NORCO) 10-325 MG per tablet Take  1 tablet by mouth every 6 (six) hours as needed. For pain      . insulin glargine (LANTUS) 100 UNIT/ML injection Inject 10 Units into the skin at bedtime.      . levETIRAcetam (KEPPRA) 500 MG tablet Take 500 mg by mouth 2 (two) times daily.      . Magnesium 200 MG TABS Take 1 tablet by mouth daily.       . metFORMIN (GLUCOPHAGE-XR) 500 MG 24 hr tablet Take 1,500 mg by mouth daily with breakfast.      . MULTIPLE VITAMIN PO Take 1 tablet by mouth daily.       Marland Kitchen omeprazole (PRILOSEC) 20 MG capsule Take 20 mg by mouth Daily.       . pregabalin (LYRICA) 300 MG capsule Take 600 mg by mouth at bedtime.       Marland Kitchen QUEtiapine (SEROQUEL) 300 MG tablet Take 300 mg by mouth at bedtime.      Marland Kitchen venlafaxine (EFFEXOR XR) 150 MG 24 hr capsule Take 150 mg by mouth daily.         BP 118/64  Pulse 73  Ht 5' (1.524 m)  Wt 182 lb (82.555 kg)  BMI 35.54 kg/m2 General: NAD, obese Neck: No JVD, no thyromegaly or thyroid nodule.  Lungs: bilateral diffuse end expiratory wheezes CV: Nondisplaced PMI.  Heart regular S1/S2, no S3/S4, no murmur.  No peripheral edema.  No carotid bruit.  Normal pedal pulses.  Abdomen: Soft, nontender, no hepatosplenomegaly, no distention.  Neurologic: Alert and oriented x 3.  Psych: Normal affect. Extremities: No clubbing or cyanosis.

## 2012-06-16 NOTE — Assessment & Plan Note (Signed)
Off Lasix with orthostatic hypotension.  She does not have significant exertional dyspnea currently.

## 2012-06-16 NOTE — Telephone Encounter (Signed)
Can refill albuterol 90 days  No refills    If she has to use this every day she would benefit from a controller med to prevent flare ups.   Such as  dulera symbicort or spiriva. Or similar .  Have her assess with her plan which is the  Least expensive for her.   Also  No tobacco exposure of any kind  would be the best cost savings and  Help protect lungs from further  Decline.

## 2012-06-16 NOTE — Telephone Encounter (Signed)
Sent to [employer] order company by e[employer].  Left message on pts voicemail.

## 2012-06-19 ENCOUNTER — Encounter (HOSPITAL_COMMUNITY): Payer: Self-pay | Admitting: Neurology

## 2012-06-21 ENCOUNTER — Encounter: Payer: Self-pay | Admitting: Internal Medicine

## 2012-06-22 ENCOUNTER — Other Ambulatory Visit: Payer: Self-pay | Admitting: Internal Medicine

## 2012-06-22 ENCOUNTER — Ambulatory Visit (INDEPENDENT_AMBULATORY_CARE_PROVIDER_SITE_OTHER): Payer: Medicare Other | Admitting: Internal Medicine

## 2012-06-22 ENCOUNTER — Encounter: Payer: Self-pay | Admitting: Internal Medicine

## 2012-06-22 VITALS — BP 112/62 | Temp 99.0°F | Wt 181.0 lb

## 2012-06-22 DIAGNOSIS — R300 Dysuria: Secondary | ICD-10-CM

## 2012-06-22 DIAGNOSIS — R0902 Hypoxemia: Secondary | ICD-10-CM

## 2012-06-22 DIAGNOSIS — G4734 Idiopathic sleep related nonobstructive alveolar hypoventilation: Secondary | ICD-10-CM

## 2012-06-22 DIAGNOSIS — F17200 Nicotine dependence, unspecified, uncomplicated: Secondary | ICD-10-CM

## 2012-06-22 DIAGNOSIS — J449 Chronic obstructive pulmonary disease, unspecified: Secondary | ICD-10-CM

## 2012-06-22 DIAGNOSIS — J42 Unspecified chronic bronchitis: Secondary | ICD-10-CM

## 2012-06-22 DIAGNOSIS — I951 Orthostatic hypotension: Secondary | ICD-10-CM

## 2012-06-22 LAB — POCT URINALYSIS DIPSTICK
Glucose, UA: NEGATIVE
Nitrite, UA: NEGATIVE
Protein, UA: NEGATIVE
Spec Grav, UA: 1.025
Urobilinogen, UA: 0.2

## 2012-06-22 MED ORDER — CIPROFLOXACIN HCL 500 MG PO TABS: 500.0000 mg | ORAL_TABLET | Freq: Two times a day (BID) | ORAL | Status: AC

## 2012-06-22 NOTE — Progress Notes (Signed)
Subjective:    Patient ID: Joyce Richardson, female    DOB: 08-Aug-1946, 66 y.o.   MRN: [id number]  HPI Patient comes in today for SDA for   problem evaluation. Has had 1-3  days of dysuria and burning low abd pain like uti.  No syncope fever chills   See last month with hosp for hypotension and enterococcus uti.   Comes in early to evaluate for uti . No hematuria but clumps of things in urine and cloudy See hops  And eval for  Hypotension and meds changed  also had sleep study and was hypoxic to 60 % and planning an overnight pulse ox test to evaluate for o2 support.   Using albuterol daily in am as needed and denies  Flares of copd  Continues to smoke . Doesn't think needs xtra meds and cost  Of symbicort is prohibitive 160 $ or more.   Review of Systems No fever flank pain NVD no swelling using compression stocking and legs feel better . Past history family history social history reviewed in the electronic medical record. Outpatient Encounter Prescriptions as of 06/22/2012  Medication Sig Dispense Refill  . atorvastatin (LIPITOR) 10 MG tablet Take 10 mg by mouth daily.      Marland Kitchen estrogens, conjugated, (PREMARIN) 0.625 MG tablet Take 0.625 mg by mouth daily.      Marland Kitchen exenatide (BYETTA) 10 MCG/0.04ML SOLN Inject 10 mcg into the skin 2 (two) times daily with a meal.       . HYDROcodone-acetaminophen (NORCO) 10-325 MG per tablet Take 1 tablet by mouth every 6 (six) hours as needed. For pain      . insulin glargine (LANTUS) 100 UNIT/ML injection Inject 10 Units into the skin at bedtime.      . levETIRAcetam (KEPPRA) 500 MG tablet Take 500 mg by mouth 2 (two) times daily.      . Magnesium 200 MG TABS Take 1 tablet by mouth daily.       . metFORMIN (GLUCOPHAGE-XR) 500 MG 24 hr tablet Take 1,500 mg by mouth daily with breakfast.      . MULTIPLE VITAMIN PO Take 1 tablet by mouth daily.       Marland Kitchen omeprazole (PRILOSEC) 20 MG capsule Take 20 mg by mouth Daily.       . pregabalin (LYRICA) 300 MG capsule Take  600 mg by mouth at bedtime.       Marland Kitchen QUEtiapine (SEROQUEL) 300 MG tablet Take 300 mg by mouth at bedtime.      Marland Kitchen venlafaxine (EFFEXOR XR) 150 MG 24 hr capsule Take 150 mg by mouth daily.       .      . DISCONTD: albuterol (PROVENTIL HFA;VENTOLIN HFA) 108 (90 BASE) MCG/ACT inhaler Inhale 2 puffs into the lungs every 6 (six) hours as needed for wheezing.  3 Inhaler  0  . DISCONTD: budesonide-formoterol (SYMBICORT) 160-4.5 MCG/ACT inhaler Inhale 2 puffs into the lungs daily as needed. For congestion            Objective:   Physical Exam BP 112/62  Temp 99 F (37.2 C) (Oral)  Wt 181 lb (82.101 kg)  WDWn in nad looks well today  Smells of tobacco   heent grossly non acute . Chest:  Clear to A&P without wheezes rales or rhonchi CV:  S1-S2 no gallops or murmurs peripheral perfusion is normal Abdomen:  Sof,t normal bowel sounds without hepatosplenomegaly, no guarding rebound or masses no CVA tenderness   Some suprapubic  tenderness.  ua neg     Assessment & Plan:   Dysuria suprapubic pain less than 24 hours acts like UTI cystitis recent hospitalization with hypotension and UTI with enterococcus.although the urine screen is negative to culture and begin on antibiotics because of past history.  History of hypotension evaluated cardiology neurology. Medications haven't changed.  COPD  cost and issue husband says that generic albuterol is available from the mail away and that a branded was sent in and cost more money please send in 90 days of the generic albuterol inhaler told him I wasn't aware that there was a generic. At this time she does not feel like she needs a controller medicine and not worth the cost Symbicort will cost next for pneumonia money and that is least expensive in their formulary. Another option would be to use a nebulizer with a long-acting LAD a and/or steroid in it.    Nocturnal hypoxia under evaluation agreed that perhaps oxygen supplementation and intervention will make  her feel a lot better and less fatigue and at that time consider trying nebulizer controller medication if she is having flares of her COPD. Of course she is aware the stopping smoking would best.  Briefly brought up bone health issue  Hx of fracture with fall   May benefit from osteoporosis rx . Last bone density a long time ago.

## 2012-06-22 NOTE — Patient Instructions (Signed)
Will institute a culture of the urine today. In the meantime we can start on antibiotic as discussed.  We'll look into sending generic albuterol inhaler to the primary off pharmacy.  Consideration of controller medicines can be nebulized budesonide and long-acting bronchodilators thickened the next and the cost goes down for this although the pump sprays her more efficient and recommended.   Agree with followup with sleep evaluation and oxygen treatment. The same people who do you of oxygen support can supply with prescription nebulizers and supplies. We can consider ordering this at that time.

## 2012-06-24 LAB — URINE CULTURE

## 2012-06-25 NOTE — Progress Notes (Signed)
Quick Note:  Tell pt urine culture showed multiple bacteria but not predominant germ. Cannot tell if she has UTI. However takt the med anyway cause of her sx ______

## 2012-07-03 ENCOUNTER — Other Ambulatory Visit: Payer: Self-pay | Admitting: Internal Medicine

## 2012-07-03 ENCOUNTER — Other Ambulatory Visit: Payer: Self-pay | Admitting: Family Medicine

## 2012-07-03 NOTE — Progress Notes (Signed)
Unsure what to do with this document  but if for a refill  Ok  If not already done ( thought we did this earlier this month)

## 2012-07-07 ENCOUNTER — Telehealth: Payer: Self-pay | Admitting: Family Medicine

## 2012-07-07 NOTE — Telephone Encounter (Signed)
Pt call stated Dr Evlyn Kanner would like for Dr Fabian Sharp to order her a pain medicine. Pt stated oxycodone

## 2012-07-07 NOTE — Telephone Encounter (Signed)
Pt is aware MD out of office until tues 07-11-12. Pt is requesting appt ASAP

## 2012-07-07 NOTE — Telephone Encounter (Signed)
Dr. Evlyn Kanner called to see if this patient's chart was flagged for narcotic use.  It was not. Will have Holly flag the chart.  This patient should not have any narcotics.  Dr. Evlyn Kanner requesting call back @ [phone].

## 2012-07-09 NOTE — Telephone Encounter (Signed)
I am not currently prescribing narcotics for her at this time. And was unaware she was requesting meds for this ..( last med from [place]  In EHR was May  For an injury) .  I don't  Think  it is a good idea and don't feel comfortable using    Narcotics for any chronic pain  condition  In her situation.   Why is she needing narcotic pain meds. ?    (She has a hx of narcotic dependence  and went through rehab   In the past.   [facility] in 2010  Or so  At  that time she was having lots of abd pain   She should not be needing narcotics for now as I am aware. )

## 2012-07-10 ENCOUNTER — Ambulatory Visit (INDEPENDENT_AMBULATORY_CARE_PROVIDER_SITE_OTHER): Payer: Medicare Other | Admitting: Cardiology

## 2012-07-10 ENCOUNTER — Encounter: Payer: Self-pay | Admitting: Cardiology

## 2012-07-10 ENCOUNTER — Encounter (INDEPENDENT_AMBULATORY_CARE_PROVIDER_SITE_OTHER): Payer: Medicare Other

## 2012-07-10 VITALS — BP 126/74 | HR 77 | Ht 60.0 in | Wt 180.0 lb

## 2012-07-10 DIAGNOSIS — I4892 Unspecified atrial flutter: Secondary | ICD-10-CM

## 2012-07-10 DIAGNOSIS — I951 Orthostatic hypotension: Secondary | ICD-10-CM

## 2012-07-10 DIAGNOSIS — F17200 Nicotine dependence, unspecified, uncomplicated: Secondary | ICD-10-CM

## 2012-07-10 DIAGNOSIS — I499 Cardiac arrhythmia, unspecified: Secondary | ICD-10-CM

## 2012-07-10 DIAGNOSIS — I498 Other specified cardiac arrhythmias: Secondary | ICD-10-CM

## 2012-07-10 MED ORDER — MIDODRINE HCL 2.5 MG PO TABS: 2.5000 mg | ORAL_TABLET | Freq: Three times a day (TID) | ORAL | Status: DC

## 2012-07-10 NOTE — Patient Instructions (Addendum)
Start midodrine 2.5mg  three times a day.  Start aspirin 81mg  daily. Take this with food.  Continue to take and record your blood pressure. Call Dr Shirlee Latch if you see a general upward trend in your blood pressure after starting midodrine.   Your physician has recommended that you wear an event monitor. Event monitors are medical devices that record the heart's electrical activity. Doctors most often [place] these monitors to diagnose arrhythmias. Arrhythmias are problems with the speed or rhythm of the heartbeat. The monitor is a small, portable device. You can wear one while you do your normal daily activities. This is usually used to diagnose what is causing palpitations/syncope (passing out). 3 week monitor  Your physician recommends that you schedule a follow-up appointment in: 1 month with Dr Shirlee Latch. Complete the event monitor before you see Dr Shirlee Latch in 1 month.

## 2012-07-10 NOTE — Telephone Encounter (Signed)
Left message for the pt to return my call. 

## 2012-07-10 NOTE — Assessment & Plan Note (Signed)
There is a question of atrial flutter on the sleep study.  Looking at the available strips, I do not think this is definitive.  I am going to put her on a 3 week monitor to assess for atrial flutter or fibrillation.   Nocturnal oxygen certainly could help decrease the risk of atrial arrhythmias.  For now, I will have her start ASA 81 mg daily.

## 2012-07-10 NOTE — Assessment & Plan Note (Signed)
Patient continues to be orthostatic by husband's readings, especially in the morning.  She has had 1 additional fall.  She has felt better since we cut back on her BP-active medications but still feels weak/lightheaded at times when she stands.  I suspect she has autonomic insufficiency related to diabetes.  I will have her start midodrine 2.5 mg tid.  We will need to monitor her BP closely to make sure that it does not rise significantly.  If it does, I will replace midodrine with pyridostigmine.  He husband will check her BP daily and give [place] a call in a couple of weeks with readings.

## 2012-07-10 NOTE — Assessment & Plan Note (Signed)
I again counseled her to quit smoking but she is not ready to do this.

## 2012-07-10 NOTE — Telephone Encounter (Signed)
Appt 07/12/12 with [facility].

## 2012-07-10 NOTE — Progress Notes (Signed)
Patient ID: Joyce Richardson, female   DOB: 03/28/1946, 66 y.o.   MRN: [id number] PCP: Dr. Fabian Sharp  66 yo with history of hyperlipidemia, HTN, and diabetes was seen initially in cardiology clinic for evaluation of shortness of breath. She has COPD with mild obstruction on PFTs and notes frequent wheezing. She had an echo done showing preserved systolic function but moderate diastolic dysfunction. Lexiscan myoview (3/11) was likely normal with anterior attenuation.   I started the patient on a diuretic trial with Lasix. This improved her exertional dyspnea.  Symbicort also helped.  She is an ongoing smoker.   More recently, patient has had problems with orthostatic hypotension.  She has had multiple falls over the last year and has broken bones.  Most of the time when she falls, she has been lightheaded and has been standing after prolonged lying or sitting.  She gets lightheaded with standing regularly.  She was admitted in 6/13 with falls, orthostatic hypotension, and a UTI.  Lasix was stopped.  She still gets lightheaded with standing on occasion.  She is now using a cane.  She saw neurology recently and had a head MRI/MRA that was relatively unremarkable.  She is wearing compression stockings.  At last appointment, I stopped her BP medications.  She has fallen once since I last saw her.  Her husband has been checking her orthostatic BP daily.  She is still orthostatic, especially in the morning.  SBP is typically in the 130s range when lying or sitting but does go as high as 150.  She had a sleep study done at [facility].  She had only mild sleep apnea but she did have significant nocturnal hypoxemia.  There was also a question of atrial flutter for a brief period by ECG => I reviewed the available strips and it is difficult to tell (artifact).  No chest pain.  Weight is down 2 lbs since last appointment.   Labs (2/11): LDL 94, HDL 64, creatinine 0.8, BNP 21  Labs (4/11): K 4.1, creatinine 0.9    Labs (5/12): K 4.4, creatinine 0.8, LDL 92, HDL 41 Labs (2/13): TSH normal Labs (6/13): K 4, creatinine 0.7  Allergies (verified):  1) ! Penicillin  2) * All Narcotics  3) * Alchol Base Drugs  4) Hyoscyamine   Past Medical History:  1. HYPERTENSION (ICD-401.9)  2. HYPERLIPIDEMIA (ICD-272.4)  3. ANXIETY (ICD-300.00)  4. 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 Hx of NARCOTIC ABUSE (ICD-305.90): [facility] summer 2010.  7. DIVERTICULOSIS-COLON (ICD-562.10)  8. IBS (ICD-564.1)  9. COPD (ICD-496): PFTs (3/11) FVC 80%, FEV1 74%, ratio 66%, TLC 90%, DLCO 54%. Mild obstruction, some response to bronchodilator.  10. TOBACCO USE (ICD-305.1)  11. ABUSE, ALCOHOL, IN REMISSION (ICD-305.03)  12. SEIZURE DISORDER (ICD-780.39)  13. GERD (ICD-530.81)  14. DIABETES MELLITUS, TYPE II (ICD-250.00)  15. DEPRESSION (ICD-311)per dr. Nolen Mu  16. ALLERGIC RHINITIS (ICD-477.9)  17. Hyponatremia with tegretol  18. Diastolic CHF: Echo (3/11) with EF 55-60%, moderate (grade II) diastolic dysfunction, mild LAE.  Echo (7/13) with EF 55-60%, mild MR, mild LVH.  19. Lexiscan myoview (3/11): likely normal with anterior attenuation. EF 68%. Low risk study.  20. Orthostatic hypotension.  21. Nocturnal hypoxemia: Sleep study in 8/13 showed mild OSA but significant nocturnal hypoxemia.  There was also a question of atrial flutter.   Family History:  Family History of father  2 suicides in family  Family History of Colon Cancer:Mothers sister  Family History of Diabetes: Both sides of family,  parents, grandparents  Family History of Heart Disease: Both sides of family, parents, grandparents   Social History:  Married 3rd time  Current Smoker 1 ppd per day since in 74s down now to about 1/2 ppd attends AA  Husband with MS on disability getting worse. in [facility] a good bit  from [place] originally.  Former Engineer, civil (consulting)  Daily Caffeine Use-1/3 cup daily  Illicit Drug Use - no  Patient does not get regular exercise.    ROS: All systems reviewed and negative except as per HPI.   Current Outpatient Prescriptions  Medication Sig Dispense Refill  . atorvastatin (LIPITOR) 10 MG tablet Take 10 mg by mouth daily.      Marland Kitchen estrogens, conjugated, (PREMARIN) 0.625 MG tablet Take 0.625 mg by mouth daily.      Marland Kitchen exenatide (BYETTA) 10 MCG/0.04ML SOLN Inject 10 mcg into the skin 2 (two) times daily with a meal.       . HYDROcodone-acetaminophen (NORCO) 10-325 MG per tablet Take 1 tablet by mouth every 4 (four) hours as needed. For pain      . insulin glargine (LANTUS) 100 UNIT/ML injection Inject 10 Units into the skin at bedtime.      . levETIRAcetam (KEPPRA) 500 MG tablet Take 500 mg by mouth 2 (two) times daily.      . Magnesium 200 MG TABS Take 1 tablet by mouth daily.       . metFORMIN (GLUCOPHAGE-XR) 500 MG 24 hr tablet Take 1,500 mg by mouth daily with breakfast.      . MULTIPLE VITAMIN PO Take 1 tablet by mouth daily.       Marland Kitchen omeprazole (PRILOSEC) 20 MG capsule Take 20 mg by mouth Daily.       . pregabalin (LYRICA) 300 MG capsule Take 600 mg by mouth at bedtime.       Marland Kitchen QUEtiapine (SEROQUEL) 300 MG tablet Take 300 mg by mouth at bedtime.      Marland Kitchen venlafaxine (EFFEXOR XR) 150 MG 24 hr capsule Take 150 mg by mouth daily.       Marland Kitchen aspirin EC 81 MG tablet Take 1 tablet (81 mg total) by mouth daily.      . midodrine (PROAMATINE) 2.5 MG tablet Take 1 tablet (2.5 mg total) by mouth 3 (three) times daily.  90 tablet  6    BP 126/74  Pulse 77  Ht 5' (1.524 m)  Wt 180 lb (81.647 kg)  BMI 35.15 kg/m2  SpO2 95% General: NAD, obese Neck: No JVD, no thyromegaly or thyroid nodule.  Lungs: bilateral diffuse end expiratory wheezes CV: Nondisplaced PMI.  Heart regular S1/S2, no S3/S4, no murmur.  No peripheral edema.  No carotid bruit.  Normal pedal pulses.  Abdomen: Soft, nontender, no hepatosplenomegaly, no distention.  Neurologic: Alert and oriented x 3.  Psych: Normal affect. Extremities: No clubbing or cyanosis.

## 2012-07-12 ENCOUNTER — Ambulatory Visit (INDEPENDENT_AMBULATORY_CARE_PROVIDER_SITE_OTHER): Payer: Medicare Other | Admitting: Internal Medicine

## 2012-07-12 ENCOUNTER — Encounter: Payer: Self-pay | Admitting: Internal Medicine

## 2012-07-12 VITALS — BP 120/60 | HR 78 | Temp 97.6°F | Wt 180.0 lb

## 2012-07-12 DIAGNOSIS — Z5189 Encounter for other specified aftercare: Secondary | ICD-10-CM

## 2012-07-12 DIAGNOSIS — F1910 Other psychoactive substance abuse, uncomplicated: Secondary | ICD-10-CM

## 2012-07-12 DIAGNOSIS — R52 Pain, unspecified: Secondary | ICD-10-CM | POA: Insufficient documentation

## 2012-07-12 DIAGNOSIS — F1911 Other psychoactive substance abuse, in remission: Secondary | ICD-10-CM

## 2012-07-12 DIAGNOSIS — I5032 Chronic diastolic (congestive) heart failure: Secondary | ICD-10-CM

## 2012-07-12 DIAGNOSIS — E119 Type 2 diabetes mellitus without complications: Secondary | ICD-10-CM

## 2012-07-12 DIAGNOSIS — I951 Orthostatic hypotension: Secondary | ICD-10-CM

## 2012-07-12 DIAGNOSIS — R350 Frequency of micturition: Secondary | ICD-10-CM | POA: Insufficient documentation

## 2012-07-12 DIAGNOSIS — F17200 Nicotine dependence, unspecified, uncomplicated: Secondary | ICD-10-CM

## 2012-07-12 DIAGNOSIS — G8921 Chronic pain due to trauma: Secondary | ICD-10-CM

## 2012-07-12 DIAGNOSIS — R0902 Hypoxemia: Secondary | ICD-10-CM

## 2012-07-12 DIAGNOSIS — G4734 Idiopathic sleep related nonobstructive alveolar hypoventilation: Secondary | ICD-10-CM

## 2012-07-12 LAB — POCT URINALYSIS DIPSTICK
Leukocytes, UA: NEGATIVE
Nitrite, UA: NEGATIVE
Protein, UA: NEGATIVE
pH, UA: 5.5

## 2012-07-12 MED ORDER — ALBUTEROL SULFATE HFA 108 (90 BASE) MCG/ACT IN AERS: 2.0000 | INHALATION_SPRAY | RESPIRATORY_TRACT | Status: AC | PRN

## 2012-07-12 MED ORDER — ALBUTEROL SULFATE HFA 108 (90 BASE) MCG/ACT IN AERS: 2.0000 | INHALATION_SPRAY | RESPIRATORY_TRACT | Status: DC | PRN

## 2012-07-12 MED ORDER — TRIMETHOPRIM 100 MG PO TABS: 100.0000 mg | ORAL_TABLET | Freq: Two times a day (BID) | ORAL | Status: AC

## 2012-07-12 NOTE — Patient Instructions (Signed)
Antibiotic and will contact you about  Culture .  Get notes and information from ortho office about the orthopedic issue and pain management.   We need a plan and poss rx short term  In the meantime .  I do not feel comfortable to do chronic pain management with your complex medical disease.   Respiratory suppression can  Happen even if you dont feel this.

## 2012-07-12 NOTE — Progress Notes (Signed)
Subjective:    Patient ID: Joyce Richardson, female    DOB: 12/11/1945, 66 y.o.   MRN: [id number]  HPI Patient comes in today for SDA for   problem evaluation. Having onset for the last day or 2 of dysuria and frequency again. She's been treated for urinary tract infection the last time despite a negative predominant germ urine culture and responded to the Cipro given after 2-3 days. She's been off the antibiotic for about 2 weeks.  She does not feel like she has bladder dysfunction such as obstructive symptoms dribbling or incomplete voiding. No fever chills  She's under evaluation for orthostatic hypotension now has a call alert button she has had a fall since the last time she was here cardiology is planning an event monitor or. Her medication is being adjusted for her chronic diastolic heart failure. She's recently had a sleep evaluation which shows hypoxia at night and was told that she's going to have to be put on oxygen during the day and night she gets hypoxic with exertion. She is working on stopping smoking can sit her in using nicotine patches and is determined to do this.  However during this visit she is asking for [place] to take over pain management because the orthopedist Dr. Hoy Register office is no longer going to do this as their offices disbanding as doctors are going to various other practices.  Since last fall she has been mostly on narcotics small amounts on a regular basis related to orthopedic surgeries and injuries. Originally shoulder surgery in the fall then falling events some documented in our record a fracture of the left arm surgery on the right arm. She states that the pain in her forearms and hands are the places that are the most problematic makes it hard for her to grip and use her arms and hands. The practitioner PA at the orthopedic office explained they thought it should get better with time as the nerves grow back. At some point recently she was told that the primary  care doctor or other Dr. should take over pain management and husband was given names of pain doctors in town. He doesn't have that information with him today.  At this time husband is managing the narcotic pain medicine and hasn't locked away and belts it out. They report that she is taking a half of a Norco 08/24/2024 4 times a day. She reports that oxycodone works much better even if she takes a half.  This offices has not prescribe medication since May.  Dr. Evlyn Kanner sees her for diabetes and this apparently is well managed. In control. Dr. Evlyn Kanner had contacted her office that [facility] had asked for him to give pain medication. He had recommended primary care pain management.   Review of Systems Negative for new chest pain shortness of breath no fever chills flank pain. Rest as per history of present illness. No hematuria Past history family history social history reviewed in the electronic medical record. She has had a hysterectomy has one ovary    Objective:   Physical Exam BP 120/60  Pulse 78  Temp 97.6 F (36.4 C) (Oral)  Wt 180 lb (81.647 kg)  SpO2 94% Well-developed well-nourished in no acute distress. Normal speech respirations at rest here with her husband. Examination of her upper extremities shows no edema skin changes no acute swelling. Abdomen soft without organomegaly guarding or rebound some tenderness in the suprapubic area no obvious masses. No clubbing or cyanosis Urinalysis is clear today  Cognition oriented x3 speech is normal.    Assessment & Plan:   Dysuria frequency recurrent  documented UTI in the past last culture was unrevealing but responded to antibiotic Cipro. Even though UA appears clear today we'll go ahead and give trimethoprim and reculture. She adamantly denies bladder dysfunction that could be setting her up. However discussed with her she keeps getting recurrent symptoms we should have a urologist evaluate. She is on many medicines they can affect bladder  function although they all seem to be necessary at this time.  Nocturnal hypoxia currently under evaluation and treatment chronic diastolic heart failure orthostatic   Hypotension, tobacco use agree 1 encouraged to stop tobacco. Asked for refill of the proarrhythmia never got sent in despite the fact that we thought it was done will send in prone air HFA as this is the same cost as the generic combination Symbicort in 2 layer are too expensive at this time for them in the $200-$300 for 3 months range.   Requested take over pain management with narcotic medication. Discussed at length my great discomfort  And uncertainty of risk benefit  with her multiple medical problems ;that narcotics are not indicated for chronic pain she has a high-risk history and medical conditions make this more problematic such as her falling ,near-syncope, orthostatic hypotension, nocturnal hypoxia to mention a few.  I agree with her that she shouldn't have to be in pain  If at all possible but we don't want to put her in the hospital with medications. Husband can monitor medications and dole them out, however she is at risk of becoming dependent and ramping up doses cause of  Hx of tolerant to these medicines. For this reason I will not prescribe narcotics today however  they will get a copy of most recent notes from he orthopedic office he's been treating and prescribing since the fall of 2012. And also names of pain management or physical medicine doctors that might be helpful in her situation. We may be able to prescribe a small amount short-term in between this transition.  Total visit 40 mins > 50% spent counseling and coordinating care   Call us tomorrow to see if we have records yet  And maybe can make referral.

## 2012-07-13 ENCOUNTER — Ambulatory Visit: Payer: Medicare Other | Admitting: Cardiology

## 2012-07-19 ENCOUNTER — Other Ambulatory Visit: Payer: Self-pay | Admitting: Internal Medicine

## 2012-07-19 ENCOUNTER — Other Ambulatory Visit: Payer: Self-pay | Admitting: Family Medicine

## 2012-07-25 ENCOUNTER — Other Ambulatory Visit: Payer: Self-pay | Admitting: Family Medicine

## 2012-07-25 DIAGNOSIS — G8929 Other chronic pain: Secondary | ICD-10-CM

## 2012-07-25 MED ORDER — HYDROCODONE-ACETAMINOPHEN 10-325 MG PO TABS: 1.0000 | ORAL_TABLET | ORAL | Status: DC | PRN

## 2012-07-28 MED ORDER — ALBUTEROL SULFATE HFA 108 (90 BASE) MCG/ACT IN AERS: 2.0000 | INHALATION_SPRAY | Freq: Four times a day (QID) | RESPIRATORY_TRACT | Status: DC | PRN

## 2012-07-28 NOTE — Addendum Note (Signed)
Addended by: Kern Reap B on: 07/28/2012 04:42 PM   Modules accepted: Orders

## 2012-07-31 ENCOUNTER — Other Ambulatory Visit: Payer: Self-pay | Admitting: Internal Medicine

## 2012-07-31 ENCOUNTER — Telehealth: Payer: Self-pay | Admitting: Internal Medicine

## 2012-07-31 MED ORDER — HYDROCODONE-ACETAMINOPHEN 10-325 MG PO TABS: 1.0000 | ORAL_TABLET | ORAL | Status: DC | PRN

## 2012-07-31 NOTE — Telephone Encounter (Signed)
Pt requesting a refill on HYDROcodone-acetaminophen (NORCO) 10-325 MG per tablet. Pt husband stated he spoke with Terri about pain management referral and pt will not be able to get appt for 3 weeks or more due to referral process. Pt would like refill on medication to get up to appt with Pain management.  PT HUSBAND REQUESTING A CALL BACK TODAY BEFORE 4:00

## 2012-07-31 NOTE — Telephone Encounter (Signed)
Thirty sent to the pharmacy.  Pt notified.  States she takes 1 pill in the morning, 1 in the afternoon and 2 at bedtime.  She is unsure when she will be able to get into pain management.

## 2012-07-31 NOTE — Telephone Encounter (Signed)
refill medication #30  until follow plan is confirmed .  Will not be managing long-term narcotic pain medication  her need for these medicines should decrease with time. As her pain reported as no skeletal orthopedic in nature from trauma and she has high risk conditions.

## 2012-08-01 ENCOUNTER — Telehealth: Payer: Self-pay | Admitting: Cardiology

## 2012-08-01 MED ORDER — PYRIDOSTIGMINE BROMIDE 60 MG PO TABS: ORAL_TABLET | ORAL | Status: DC

## 2012-08-01 NOTE — Telephone Encounter (Signed)
Pt's husband calling stating wife is having problems with ambulating, orhtostatic BP's, and elevated BP--per pt's husband "dr love thinks reducing dose of midodrine might help--all information given to dr Shirlee Latch who stated he would call husband back after clinic--i called husband and related information to him--husband agrees

## 2012-08-01 NOTE — Telephone Encounter (Signed)
Talked with patient's husband.  Will stop midodrine as she has supine hypertension and try her on pyridostigmine 30 mg tid.  They were warned about possible side effects to watch for.

## 2012-08-01 NOTE — Telephone Encounter (Signed)
Pt is having problems waking up in the am mainily in her limbs are hurting and she is finding hard to move and she takes ibuprophen and hydrocodone. b/p is 150 to 159 systolic and that is lying down and when she stands up it drops about 30 points or more. This is starting to concern Dr. Sandria Manly and her midodrine may be too strong and we need to look at that. The 24/7 heart monitor will complete tonight and he will rtn it tomorrow. He would like a call asap regarding this.

## 2012-08-11 ENCOUNTER — Telehealth: Payer: Self-pay | Admitting: Internal Medicine

## 2012-08-11 ENCOUNTER — Other Ambulatory Visit: Payer: Self-pay | Admitting: Internal Medicine

## 2012-08-11 ENCOUNTER — Telehealth: Payer: Self-pay | Admitting: *Deleted

## 2012-08-11 MED ORDER — HYDROCODONE-ACETAMINOPHEN 10-325 MG PO TABS: 1.0000 | ORAL_TABLET | ORAL | Status: DC | PRN

## 2012-08-11 NOTE — Telephone Encounter (Signed)
Pts spouse called back and said that he spoke with the pain management clinic and said that they are hoping to make a decision by the end of next wk as to whether they will accept the referral or not. Pt needs refill of HYDROcodone-acetaminophen (NORCO) 10-325 MG per tablet to [facility] on [address]. Pls call in today and notify when done.

## 2012-08-11 NOTE — Telephone Encounter (Signed)
Dr Shirlee Latch reviewed monitor done 07/10/12-08/01/12. No atrial fibrillation or flutter. Pt notified.

## 2012-08-11 NOTE — Telephone Encounter (Signed)
Ron (husband) notified by telephone.

## 2012-08-11 NOTE — Telephone Encounter (Signed)
Medication called to the pharmacy and left on voicemail. 

## 2012-08-11 NOTE — Telephone Encounter (Signed)
Pt husband called stating that his wife still has not been scheduled for pain management and is running out of meds and would like a refill. Pt husband stated that she will be out by Monday. It could be several more week before she gets in with pain management

## 2012-08-16 ENCOUNTER — Encounter: Payer: Self-pay | Admitting: Internal Medicine

## 2012-08-17 ENCOUNTER — Telehealth: Payer: Self-pay | Admitting: Internal Medicine

## 2012-08-17 NOTE — Telephone Encounter (Signed)
FYI Pt has referral for pain management to see Joyce Richardson. They declined her as pt. Was was re-referred to [facility] and was scheduled for today. Pt's husband called and canceled appt.

## 2012-08-18 NOTE — Telephone Encounter (Signed)
Please document  Why they cancelled and plan . Will not continue refilling rx as she needs specialty care to evaluated and manage her pain.

## 2012-08-18 NOTE — Telephone Encounter (Signed)
I spoke to Joyce Richardson (husband) who informed me that [facility] called several times to make an appt for Joyce Richardson.  Joyce Richardson stated that he felt pressured to make an appt.  He also stated that he went on the internet to look up the clinic and the doctors and did not find any good comments about them.  Therefore, he cancelled the appt and is going to call Dr. Sandria Manly to see if he will take over her narcotic medication.  Informed Joyce Richardson that Dr. Fabian Sharp will probably not continue to refill hydrocodone-acetaminophen for the patient.

## 2012-08-21 ENCOUNTER — Encounter: Payer: Self-pay | Admitting: Cardiology

## 2012-08-21 ENCOUNTER — Ambulatory Visit (INDEPENDENT_AMBULATORY_CARE_PROVIDER_SITE_OTHER): Payer: Medicare Other | Admitting: Cardiology

## 2012-08-21 VITALS — BP 110/70 | HR 80 | Ht 60.5 in | Wt 180.8 lb

## 2012-08-21 DIAGNOSIS — F17200 Nicotine dependence, unspecified, uncomplicated: Secondary | ICD-10-CM

## 2012-08-21 DIAGNOSIS — R55 Syncope and collapse: Secondary | ICD-10-CM

## 2012-08-21 DIAGNOSIS — E785 Hyperlipidemia, unspecified: Secondary | ICD-10-CM

## 2012-08-21 DIAGNOSIS — I951 Orthostatic hypotension: Secondary | ICD-10-CM

## 2012-08-21 LAB — BASIC METABOLIC PANEL
CO2: 27 mEq/L (ref 19–32)
Calcium: 8.7 mg/dL (ref 8.4–10.5)
Chloride: 102 mEq/L (ref 96–112)
Sodium: 138 mEq/L (ref 135–145)

## 2012-08-21 LAB — LIPID PANEL: Total CHOL/HDL Ratio: 4

## 2012-08-21 MED ORDER — PYRIDOSTIGMINE BROMIDE 60 MG PO TABS: ORAL_TABLET | ORAL | Status: DC

## 2012-08-21 NOTE — Patient Instructions (Addendum)
Increase pyridostigmine(mestinon) to 60mg  three times a day.  Your physician recommends that you have  lab work today--Lipid profile/BMET.  Your physician recommends that you schedule a follow-up appointment in: 3 months with Dr Shirlee Latch.

## 2012-08-21 NOTE — Progress Notes (Signed)
Patient ID: Joyce Richardson, female   DOB: 1946/01/14, 66 y.o.   MRN: [id number] PCP: Dr. Fabian Sharp  66 yo with history of hyperlipidemia, HTN, and diabetes was seen initially in cardiology clinic for evaluation of shortness of breath. She has COPD with mild obstruction on PFTs and notes frequent wheezing. She had an echo done showing preserved systolic function but moderate diastolic dysfunction. Lexiscan myoview (3/11) was likely normal with anterior attenuation.   I started the patient on a diuretic trial with Lasix. This improved her exertional dyspnea.  Symbicort also helped.  She is an ongoing smoker though she is now wearing the nicotine patch and has cut back from 3 ppd to 6 cigs/day.   More recently, patient has had problems with orthostatic hypotension.  She has had multiple falls over the last year and has broken bones.  Most of the time when she falls, she has been lightheaded and has been standing after prolonged lying or sitting.  She gets lightheaded with standing regularly.  She was admitted in 6/13 with falls, orthostatic hypotension, and a UTI.  Lasix was stopped.  She still gets lightheaded with standing on occasion.  She is now using a cane.  She saw neurology and had a head MRI/MRA that was relatively unremarkable.  She is wearing compression stockings.  At a prior appointment, I stopped her BP medications. She continued to be orthostatic when her husband would check, so I began her on a low dose of midodrine.  This caused her supine BP to rise significantly, so she stopped this.  I had her start on a low dose of pyridostigmine instead.  She is still orthostatic when her husband measures but has not had a lot of symptomatic lightheadedness.  One fall since I last saw her (got her feet tangled up working in the garden, does not seem to have been related to lightheadedness).    She had a sleep study done at [facility].  She had only mild sleep apnea but she did have significant  nocturnal hypoxemia.  There was also a question of atrial flutter for a brief period by ECG => I reviewed the available strips and it is difficult to tell (artifact).  I had her wear a 3 week monitor in 8/13 to look for atrial arrhythmias: she did not have any atrial fibrillation or flutter.   Labs (2/11): LDL 94, HDL 64, creatinine 0.8, BNP 21  Labs (4/11): K 4.1, creatinine 0.9  Labs (5/12): K 4.4, creatinine 0.8, LDL 92, HDL 41 Labs (2/13): TSH normal Labs (6/13): K 4, creatinine 0.7  Allergies (verified):  1) ! Penicillin  2) * All Narcotics  3) * Alchol Base Drugs  4) Hyoscyamine   Past Medical History:  1. HYPERTENSION (ICD-401.9)  2. HYPERLIPIDEMIA (ICD-272.4)  3. ANXIETY (ICD-300.00)  4. 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 Hx of NARCOTIC ABUSE (ICD-305.90): [facility] summer 2010.  7. DIVERTICULOSIS-COLON (ICD-562.10)  8. IBS (ICD-564.1)  9. COPD (ICD-496): PFTs (3/11) FVC 80%, FEV1 74%, ratio 66%, TLC 90%, DLCO 54%. Mild obstruction, some response to bronchodilator.  10. TOBACCO USE (ICD-305.1)  11. ABUSE, ALCOHOL, IN REMISSION (ICD-305.03)  12. SEIZURE DISORDER (ICD-780.39)  13. GERD (ICD-530.81)  14. DIABETES MELLITUS, TYPE II (ICD-250.00)  15. DEPRESSION (ICD-311)per dr. Nolen Mu  16. ALLERGIC RHINITIS (ICD-477.9)  17. Hyponatremia with tegretol  18. Diastolic CHF: Echo (3/11) with EF 55-60%, moderate (grade II) diastolic dysfunction, mild LAE.  Echo (7/13) with EF 55-60%, mild MR, mild LVH.  19. Lexiscan myoview (3/11): likely normal with anterior attenuation. EF 68%. Low risk study.  20. Orthostatic hypotension with supine hypertension.   21. Nocturnal hypoxemia: Sleep study in 8/13 showed mild OSA but significant nocturnal hypoxemia.  There was also a question of atrial flutter.  3 week monitor in 8/13 showed no atrial fibrillation or flutter.   Family History:  Family History of father  2 suicides in family  Family History of Colon Cancer:Mothers sister    Family History of Diabetes: Both sides of family, parents, grandparents  Family History of Heart Disease: Both sides of family, parents, grandparents   Social History:  Married 3rd time  Current Smoker up to 3 ppd per day since in 20s down now to < 1/2 ppd attends [employer]  Husband with MS on disability getting worse. in [facility] a good bit  from [place] originally.  Former Engineer, civil (consulting)  Daily Caffeine Use-1/3 cup daily  Illicit Drug Use - no  Patient does not get regular exercise.   ROS: All systems reviewed and negative except as per HPI.   Current Outpatient Prescriptions  Medication Sig Dispense Refill  . albuterol (PROAIR HFA) 108 (90 BASE) MCG/ACT inhaler Inhale 2 puffs into the lungs every 4 (four) hours as needed for shortness of breath.  1 Inhaler  0  . aspirin EC 81 MG tablet Take 1 tablet (81 mg total) by mouth daily.      Marland Kitchen atorvastatin (LIPITOR) 10 MG tablet Take 10 mg by mouth daily.      Marland Kitchen estrogens, conjugated, (PREMARIN) 0.625 MG tablet Take 0.625 mg by mouth daily.      Marland Kitchen exenatide (BYETTA) 10 MCG/0.04ML SOLN Inject 10 mcg into the skin 2 (two) times daily with a meal.       . HYDROcodone-acetaminophen (NORCO) 10-325 MG per tablet Take 1 tablet by mouth every 4 (four) hours as needed. For pain  30 tablet  0  . insulin glargine (LANTUS) 100 UNIT/ML injection Inject 10 Units into the skin at bedtime.      . levETIRAcetam (KEPPRA) 500 MG tablet Take 500 mg by mouth 2 (two) times daily.      . Magnesium 200 MG TABS Take 1 tablet by mouth daily.       . metFORMIN (GLUCOPHAGE-XR) 500 MG 24 hr tablet Take 1,500 mg by mouth daily with breakfast.      . MULTIPLE VITAMIN PO Take 1 tablet by mouth daily.       Marland Kitchen omeprazole (PRILOSEC) 20 MG capsule Take 20 mg by mouth Daily.       . pregabalin (LYRICA) 300 MG capsule Take 1 capsule (300 mg total) by mouth at bedtime.  30 capsule  0  . pyridostigmine (MESTINON) 60 MG tablet 1 tablet  three times a day  270 tablet  3  . QUEtiapine (SEROQUEL) 300  MG tablet Take 300 mg by mouth at bedtime.      Marland Kitchen venlafaxine (EFFEXOR XR) 150 MG 24 hr capsule Take 150 mg by mouth daily.       Marland Kitchen DISCONTD: pyridostigmine (MESTINON) 60 MG tablet 1/2 tablet (total 30 mg ) three times a day  135 tablet  3    BP 110/70  Pulse 80  Ht 5' 0.5" (1.537 m)  Wt 180 lb 12.8 oz (82.01 kg)  BMI 34.73 kg/m2  SpO2 96% General: NAD, obese Neck: No JVD, no thyromegaly or thyroid nodule.  Lungs: bilateral diffuse end expiratory wheezes CV: Nondisplaced PMI.  Heart regular  S1/S2, no S3/S4, no murmur.  No peripheral edema.  No carotid bruit.  Normal pedal pulses.  Abdomen: Soft, nontender, no hepatosplenomegaly, no distention.  Neurologic: Alert and oriented x 3.  Psych: Normal affect. Extremities: No clubbing or cyanosis.   Assessment/Plan:  Orthostatic hypotension  Patient continues to be orthostatic by husband's readings. She has had 1 additional fall that actually seems to be mechanical rather than related to lightheadedness. She is not have much symptomatic lightheadedness.  We stopped midodrine and started pyridostigmine due to supine hypertension with orthostatic hypotension.  Today, I will increase pyridostigmine to 60 mg tid to see if this helps with the orthostasis.  Husband will continue to check daily orthostatics.  Atrial arrhythmia  There is a question of atrial flutter on the sleep study. Looking at the available strips, I did not think this was definitive. I had her wear an event monitor for 3 wks.  This showed no atrial fibrillation or flutter.  Nocturnal oxygen certainly could help decrease the risk of atrial arrhythmias. Continue ASA 81 mg daily. TOBACCO USE  She has been cutting back with a nicotine patch.  I counselled her to continue to work to quit.  Hyperlipidemia Check lipids today.   Marca Ancona 08/21/2012 12:39 PM

## 2012-09-05 ENCOUNTER — Ambulatory Visit: Payer: Medicare Other | Admitting: Cardiology

## 2012-09-06 ENCOUNTER — Telehealth: Payer: Self-pay | Admitting: Cardiology

## 2012-09-06 NOTE — Telephone Encounter (Signed)
Spoke with husband. Recently  pt has been more fatigued /muscle cramps/dizzy  -recent orthostatic BP readings:  140/74 L 114/71 S 09/06/12 132/70 L 104/71 S 09/04/12 .

## 2012-09-06 NOTE — Telephone Encounter (Signed)
New Problem:    Patient's husband called in wanting to speak with you about his wife's Orthostatic Hypotension.  Her dystolic is still dropping rapidly when she goes from lying to standing.  She is feeling dizzy, has rapid muscle spasms, and is lightheaded.  Patient had a reoccurring nightmare three times last night. Please call back.

## 2012-09-06 NOTE — Telephone Encounter (Signed)
For now, would make sure to continue compression stockings every day while awake and continue pyridostigmine.  She can liberalize dietary sodium.  Would not change anything else yet.

## 2012-09-06 NOTE — Telephone Encounter (Signed)
Pt's husband advised, verbalized understanding. 

## 2012-09-06 NOTE — Telephone Encounter (Signed)
Pt's husband states orthostatic BP readings not better since increasing pyridostigmine. I will forward to Dr Shirlee Latch for review.

## 2012-09-18 ENCOUNTER — Telehealth: Payer: Self-pay | Admitting: Cardiology

## 2012-09-18 NOTE — Telephone Encounter (Signed)
Spoke with pt's husband. About 30 minutes ago pt complained of hard heart beat associated with right sided chest  discomfort and shaking all over. Pt was just resting at the time.  It was time to take Keppra so she took it thinking she may be having some sort of seizure activity.  Pt's husband checked her BP. It was 144/69 lying 98/67 standing. Husband states pt is feeling better now, but may go to an Urgent Care if symptoms worsen again. I will forward to Dr Shirlee Latch for review.

## 2012-09-18 NOTE — Telephone Encounter (Signed)
If palpitations recur, may need heart monitor.  We will also need to consider going on fludrocortisone to help with the orthostasis that is ongoing.

## 2012-09-18 NOTE — Telephone Encounter (Signed)
New Problem:    Patient's husband called in because his wife was lying down watching TV when she started feeling real bad.  Patient felt like her heart was beating real hard and her left side close to her hear was hurting.  Patient's left side was trembling and when she stood her BP when

## 2012-09-18 NOTE — Telephone Encounter (Signed)
Spoke with pt's husband.

## 2012-09-18 NOTE — Telephone Encounter (Signed)
Attempted to contact pt. No answer

## 2012-09-19 ENCOUNTER — Ambulatory Visit
Admission: RE | Admit: 2012-09-19 | Discharge: 2012-09-19 | Disposition: A | Discharge status: Home / Self Care | Payer: Medicare Other | Source: Ambulatory Visit | Attending: Neurology | Admitting: Neurology

## 2012-09-19 ENCOUNTER — Other Ambulatory Visit: Payer: Self-pay | Admitting: Neurology

## 2012-09-19 ENCOUNTER — Telehealth: Payer: Self-pay | Admitting: Internal Medicine

## 2012-09-19 DIAGNOSIS — M25529 Pain in unspecified elbow: Secondary | ICD-10-CM

## 2012-09-19 NOTE — Telephone Encounter (Signed)
Spoke with pt's husband. Pt was seen by Dr Sandria Manly this morning. Pt's husband states that Dr Sandria Manly recommended pt increase aspirin to two 81mg  tablets daily,but did not recommend any other medication changes. Pt's husband would  like Dr Alford Highland recommendation about going on fludrocortisone.

## 2012-09-19 NOTE — Telephone Encounter (Signed)
She can try fludrocortisone 0.1 mg daily but needs to follow BP carefully on this medication and call us if BP rises significantly.

## 2012-09-19 NOTE — Telephone Encounter (Signed)
Pt requesting to switch pcp from Dr. Fabian Sharp to Dr. Felicity Coyer due to distance to office.

## 2012-09-19 NOTE — Telephone Encounter (Signed)
LMTCB

## 2012-09-19 NOTE — Telephone Encounter (Signed)
I will forward to Dr McLean for review 

## 2012-09-20 NOTE — Telephone Encounter (Signed)
Sorry, no, i will not accept this patient.

## 2012-09-21 NOTE — Telephone Encounter (Signed)
Spoke with pt's husband. He will call me back later today to discuss fludrocortisone.

## 2012-09-22 NOTE — Telephone Encounter (Signed)
Follow-up:    Patient returned Joyce Richardson's call.  Patient's wife has decreased her doses from three whole tablets to 2.5 tablets and seems to be doing fine.  Please call back.

## 2012-09-22 NOTE — Telephone Encounter (Signed)
She can stay on Mestinon for now.  Can add fludrocortisone to this in the future if needed.

## 2012-09-22 NOTE — Telephone Encounter (Signed)
Joyce Richardson was notified and agrees.

## 2012-09-22 NOTE — Telephone Encounter (Signed)
Joyce Richardson is doing better and would like to stay on this decreased dose of mestinon instead of switching to the fludocortisone.

## 2012-09-28 ENCOUNTER — Ambulatory Visit
Admission: RE | Admit: 2012-09-28 | Discharge: 2012-09-28 | Disposition: A | Discharge status: Home / Self Care | Payer: Medicare Other | Source: Ambulatory Visit | Attending: Anesthesiology | Admitting: Anesthesiology

## 2012-09-28 ENCOUNTER — Other Ambulatory Visit: Payer: Self-pay | Admitting: Anesthesiology

## 2012-09-28 DIAGNOSIS — M25551 Pain in right hip: Secondary | ICD-10-CM

## 2012-09-28 DIAGNOSIS — W19XXXA Unspecified fall, initial encounter: Secondary | ICD-10-CM

## 2012-10-02 NOTE — Telephone Encounter (Signed)
Pt requesting to switch to another female provider at location closer to home(lives in [place] [facility] [place] [facility] [place] [facility])  Since Dr. Felicity Coyer declined to allow pt to transfer, requesting to see Dr Ermalene Searing.

## 2012-10-02 NOTE — Telephone Encounter (Signed)
Ok with me if ok with other physician.

## 2012-10-03 NOTE — Telephone Encounter (Signed)
I am not accepting new pts at this time

## 2012-11-01 ENCOUNTER — Telehealth: Payer: Self-pay | Admitting: *Deleted

## 2012-11-01 NOTE — Telephone Encounter (Signed)
Pt's husband given monitor report done 07/10/12-08/01/12--no significant arrhythmia reviewed by Dr Shirlee Latch.

## 2012-11-20 ENCOUNTER — Ambulatory Visit (INDEPENDENT_AMBULATORY_CARE_PROVIDER_SITE_OTHER): Payer: Medicare Other | Admitting: Cardiology

## 2012-11-20 ENCOUNTER — Encounter: Payer: Self-pay | Admitting: Cardiology

## 2012-11-20 VITALS — BP 105/60 | HR 81 | Ht 60.0 in | Wt 179.0 lb

## 2012-11-20 DIAGNOSIS — I951 Orthostatic hypotension: Secondary | ICD-10-CM

## 2012-11-20 DIAGNOSIS — R0602 Shortness of breath: Secondary | ICD-10-CM

## 2012-11-20 DIAGNOSIS — F17200 Nicotine dependence, unspecified, uncomplicated: Secondary | ICD-10-CM

## 2012-11-20 DIAGNOSIS — J449 Chronic obstructive pulmonary disease, unspecified: Secondary | ICD-10-CM

## 2012-11-20 NOTE — Progress Notes (Signed)
Patient ID: Joyce Richardson, female   DOB: Jan 01, 1946, 66 y.o.   MRN: [id number] PCP: Dr. Fabian Sharp  66 yo with history of HTN, COPD, and diastolic CHF returns for cardiology followup.  Over the last year, patient has had problems with orthostatic hypotension.  She has had multiple falls and has had broken bones.  At a prior appointment, I stopped her BP medications. She continued to be orthostatic when her husband would check, so I began her on a low dose of midodrine.  This caused her supine BP to rise significantly, so she stopped this.  I had her start on a low dose of pyridostigmine instead.  This medication seems to be working well.  She is no longer lightheaded with standing.  She has had some trouble recently with falling out of bed at night while dreaming.  She continues to have exertional dyspnea after walking about 50 feet.  She is using oxygen now at all times.  She is still smoking 1-2 cigarettes/day.  She is not on any inhalers other than albuterol.    Labs (2/11): LDL 94, HDL 64, creatinine 0.8, BNP 21  Labs (4/11): K 4.1, creatinine 0.9  Labs (5/12): K 4.4, creatinine 0.8, LDL 92, HDL 41 Labs (2/13): TSH normal Labs (6/13): K 4, creatinine 0.7 Labs (9/13): K 4.3, creatinine 1.0, LDL 72, TGs 244  Allergies (verified):  1) ! Penicillin  2) * All Narcotics  3) * Alchol Base Drugs  4) Hyoscyamine   Past Medical History:  1. HYPERTENSION (ICD-401.9)  2. HYPERLIPIDEMIA (ICD-272.4)  3. ANXIETY (ICD-300.00)  4. 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 Hx of NARCOTIC ABUSE (ICD-305.90): [facility] summer 2010.  7. DIVERTICULOSIS-COLON (ICD-562.10)  8. IBS (ICD-564.1)  9. COPD (ICD-496): PFTs (3/11) FVC 80%, FEV1 74%, ratio 66%, TLC 90%, DLCO 54%. Mild obstruction, some response to bronchodilator.  10. TOBACCO USE (ICD-305.1)  11. ABUSE, ALCOHOL, IN REMISSION (ICD-305.03)  12. SEIZURE DISORDER (ICD-780.39)  13. GERD (ICD-530.81)  14. DIABETES MELLITUS, TYPE II (ICD-250.00)  15.  DEPRESSION (ICD-311)per dr. Nolen Mu  16. ALLERGIC RHINITIS (ICD-477.9)  17. Hyponatremia with tegretol  18. Diastolic CHF: Echo (3/11) with EF 55-60%, moderate (grade II) diastolic dysfunction, mild LAE.  Echo (7/13) with EF 55-60%, mild MR, mild LVH.  19. Lexiscan myoview (3/11): likely normal with anterior attenuation. EF 68%. Low risk study.  20. Orthostatic hypotension with supine hypertension.   21. Nocturnal hypoxemia: Sleep study in 8/13 showed mild OSA but significant nocturnal hypoxemia.  There was also a question of atrial flutter.  3 week monitor in 8/13 showed no atrial fibrillation or flutter.   Family History:  Family History of father  2 suicides in family  Family History of Colon Cancer:Mothers sister  Family History of Diabetes: Both sides of family, parents, grandparents  Family History of Heart Disease: Both sides of family, parents, grandparents   Social History:  Married 3rd time  Current Smoker up to 3 ppd per day since in 20s down now to < 1/2 ppd attends [employer]  Husband with MS on disability getting worse. in [facility] a good bit  from [place] originally.  Former Engineer, civil (consulting)  Daily Caffeine Use-1/3 cup daily  Illicit Drug Use - no  Patient does not get regular exercise.   ROS: All systems reviewed and negative except as per HPI.   Current Outpatient Prescriptions  Medication Sig Dispense Refill  . albuterol (PROAIR HFA) 108 (90 BASE) MCG/ACT inhaler Inhale 2 puffs into the lungs every 4 (  four) hours as needed for shortness of breath.  1 Inhaler  0  . aspirin EC 81 MG tablet Take 162 mg by mouth daily.       Marland Kitchen atorvastatin (LIPITOR) 10 MG tablet Take 10 mg by mouth daily.      Marland Kitchen estrogens, conjugated, (PREMARIN) 0.625 MG tablet Take 0.625 mg by mouth daily.      Marland Kitchen exenatide (BYETTA) 10 MCG/0.04ML SOLN Inject 10 mcg into the skin 2 (two) times daily with a meal.       . HYDROcodone-acetaminophen (NORCO) 10-325 MG per tablet Take 1 tablet by mouth every 6 (six) hours as  needed.      . insulin glargine (LANTUS) 100 UNIT/ML injection Inject 30 Units into the skin at bedtime.      . levETIRAcetam (KEPPRA) 500 MG tablet Take 500 mg by mouth 2 (two) times daily.      . Magnesium 200 MG TABS Take 1 tablet by mouth daily.       . metFORMIN (GLUCOPHAGE-XR) 500 MG 24 hr tablet Take 1,500 mg by mouth daily with breakfast.      . MULTIPLE VITAMIN PO Take 1 tablet by mouth daily.       . nicotine (NICODERM CQ - DOSED IN MG/24 HOURS) 21 mg/24hr patch Place 1 patch onto the skin daily.      Marland Kitchen omeprazole (PRILOSEC) 20 MG capsule Take 20 mg by mouth Daily.       Marland Kitchen oxyCODONE-acetaminophen (PERCOCET) 7.5-325 MG per tablet Take 1 tablet by mouth every 4 (four) hours as needed.      . pregabalin (LYRICA) 300 MG capsule Take 600 mg by mouth at bedtime.   30 capsule  0  . pyridostigmine (MESTINON) 60 MG tablet Take 60 mg by mouth 2 (two) times daily.      . QUEtiapine (SEROQUEL) 300 MG tablet Take 300 mg by mouth at bedtime.      Marland Kitchen venlafaxine (EFFEXOR XR) 150 MG 24 hr capsule Take 150 mg by mouth daily.         BP 105/60  Pulse 81  Ht 5' (1.524 m)  Wt 179 lb (81.194 kg)  BMI 34.96 kg/m2 General: NAD, obese Neck: No JVD, no thyromegaly or thyroid nodule.  Lungs: bilateral diffuse end expiratory wheezes CV: Nondisplaced PMI.  Heart regular S1/S2, no S3/S4, no murmur.  No peripheral edema.  No carotid bruit.  Normal pedal pulses.  Abdomen: Soft, nontender, no hepatosplenomegaly, no distention.  Neurologic: Alert and oriented x 3.  Psych: Normal affect. Extremities: No clubbing or cyanosis.   Assessment/Plan:   Orthostatic hypotension  She is no longer having symptomatic lightheadedness with standing.  She feels that pyridostigmine is helping.  I will continue the current dose.  She will also continue wearing compression stockings.   TOBACCO USE  She has been cutting back with a nicotine patch.  I counselled her to continue to work to quit.  Exertional dyspnea She does  not appear volume overloaded on exam.  I think that the major cause for her dyspnea may be COPD.  She is now on home oxygen.  I will get repeat PFTs (last in 2011).  I will also refer her to pulmonology.  She says that she cannot afford Spiriva, etc.  She may be able to get samples through the pulmonary office.    Joyce Richardson 11/20/2012

## 2012-11-20 NOTE — Patient Instructions (Addendum)
Your physician has recommended that you have a pulmonary function test. Pulmonary Function Tests are a group of tests that measure how well air moves in and out of your lungs.  You have been referred to [facility] for management of your COPD.   Your physician wants you to follow-up in: 6 months with Dr Shirlee Latch. (June 2014). You will receive a reminder letter in the mail two months in advance. If you don't receive a letter, please call our office to schedule the follow-up appointment.

## 2012-12-12 ENCOUNTER — Ambulatory Visit (INDEPENDENT_AMBULATORY_CARE_PROVIDER_SITE_OTHER): Payer: Medicare Other | Admitting: Internal Medicine

## 2012-12-12 DIAGNOSIS — R0989 Other specified symptoms and signs involving the circulatory and respiratory systems: Secondary | ICD-10-CM

## 2012-12-12 DIAGNOSIS — J449 Chronic obstructive pulmonary disease, unspecified: Secondary | ICD-10-CM

## 2012-12-12 LAB — PULMONARY FUNCTION TEST

## 2012-12-12 NOTE — Progress Notes (Signed)
PFT done today. 

## 2012-12-13 ENCOUNTER — Encounter: Payer: Self-pay | Admitting: Internal Medicine

## 2012-12-13 ENCOUNTER — Ambulatory Visit (INDEPENDENT_AMBULATORY_CARE_PROVIDER_SITE_OTHER): Payer: Medicare Other | Admitting: Internal Medicine

## 2012-12-13 ENCOUNTER — Ambulatory Visit (INDEPENDENT_AMBULATORY_CARE_PROVIDER_SITE_OTHER)
Admission: RE | Admit: 2012-12-13 | Discharge: 2012-12-13 | Disposition: A | Discharge status: Home / Self Care | Payer: Medicare Other | Source: Ambulatory Visit | Attending: Internal Medicine | Admitting: Internal Medicine

## 2012-12-13 VITALS — BP 122/62 | HR 88 | Temp 98.2°F | Ht 61.0 in | Wt 186.0 lb

## 2012-12-13 DIAGNOSIS — J449 Chronic obstructive pulmonary disease, unspecified: Secondary | ICD-10-CM

## 2012-12-13 DIAGNOSIS — J9610 Chronic respiratory failure, unspecified whether with hypoxia or hypercapnia: Secondary | ICD-10-CM | POA: Insufficient documentation

## 2012-12-13 DIAGNOSIS — F17200 Nicotine dependence, unspecified, uncomplicated: Secondary | ICD-10-CM

## 2012-12-13 NOTE — Assessment & Plan Note (Addendum)
GOLD 0 COPD with nocturnal desats (see chronic resp failure sep a/p) but very little airflow obstruction  The issue is whether adding maint rx would translate to better sats > I think smoking cessation would do more  Will follow up in 3 months just on prn saba for now

## 2012-12-13 NOTE — Patient Instructions (Addendum)
When you walk flat up a hundred yards you don't need oxygen   When you sleep and when you exert you should the 2lpm   Only use your albuterol (ventolin)  as a rescue medication to be used if you can't catch your breath by resting or doing a relaxed purse lip breathing pattern. The less you use it, the better it will work when you need it.   Please remember to go to the  x-ray department downstairs for your tests - we will call you with the results when they are available.     Please schedule a follow up visit in 3 months but call sooner if needed

## 2012-12-13 NOTE — Progress Notes (Signed)
  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 No unusual exp hx or h/o childhood pna/ asthma or premature birth to her knowledge.   Reduced smoking when started 23 Jun 2012 but prior to that desats walking mailbox and back and therefore placed on 2lpm 24/7 by Dr Sandria Manly plus saba q am whether needs it or not but doesn't perceive it does anything for her.  On 02 leeping ok without nocturnal  or early am exacerbation  of respiratory  c/o's or need for noct saba or am ha.  Also denies any obvious fluctuation of symptoms with weather or environmental changes or other aggravating or alleviating factors except as outlined above.    Review of Systems  Constitutional: Negative for fever, chills and unexpected weight change.  HENT: Positive for trouble swallowing. Negative for ear pain, nosebleeds, congestion, sore throat, rhinorrhea, sneezing, dental problem, voice change, postnasal drip and sinus pressure.   Eyes: Negative for visual disturbance.  Respiratory: Positive for cough and shortness of breath. Negative for choking.   Cardiovascular: Positive for leg swelling. Negative for chest pain.  Gastrointestinal: Negative for vomiting, abdominal pain and diarrhea.  Genitourinary: Negative for difficulty urinating.  Musculoskeletal: Positive for arthralgias.  Skin: Negative for rash.  Neurological: Negative for tremors, syncope and headaches.  Hematological: Does not bruise/bleed easily.       Objective:   Physical Exam  Wt Readings from Last 3 Encounters:  12/13/12 186 lb (84.369 kg)  11/20/12 179 lb (81.194  kg)  08/21/12 180 lb 12.8 oz (82.01 kg)   pleasant amb wf with mild rattling cough HEENT mild turbinate edema.  Oropharynx no thrush or excess pnd or cobblestoning.  No JVD or cervical adenopathy. Mild accessory muscle hypertrophy. Trachea midline, nl thryroid. Chest was hyperinflated by percussion with diminished breath sounds and min increased exp time with min exp rhonchi  wheeze. Hoover sign positive at mid inspiration. Regular rate and rhythm without murmur gallop or rub or increase P2 or edema.  Abd: obese no hsm, nl excursion. Ext warm without cyanosis or clubbing.    CXR  12/13/2012 :  Chronic emphysematous and bronchitic lung changes without acute overlying pulmonary process.      Assessment & Plan:

## 2012-12-13 NOTE — Assessment & Plan Note (Signed)
Strongly advised stopping all smoking at this point

## 2012-12-13 NOTE — Assessment & Plan Note (Signed)
-   placed on 02 24/7 by Love 06/2012 - 12/13/2012  Walked RA  3 laps @ 185 ft each stopped due to  desat and end of study to 87 but 88% p 3 laps on 2lpm  Adequate control on present rx, reviewed rx - See instructions for specific recommendations which were reviewed directly with the patient who was given a copy with highlighter outlining the key components.

## 2012-12-14 NOTE — Progress Notes (Signed)
Quick Note:  Spoke with pt and notified of results per Dr. Wert. Pt verbalized understanding and denied any questions.  ______ 

## 2013-01-08 ENCOUNTER — Encounter: Payer: Self-pay | Admitting: Cardiology

## 2013-01-20 DIAGNOSIS — M25839 Other specified joint disorders, unspecified wrist: Secondary | ICD-10-CM

## 2013-01-20 DIAGNOSIS — G5601 Carpal tunnel syndrome, right upper limb: Secondary | ICD-10-CM

## 2013-01-20 HISTORY — DX: Other specified joint disorders, unspecified wrist: M25.839

## 2013-01-20 HISTORY — DX: Carpal tunnel syndrome, right upper limb: G56.01

## 2013-02-02 ENCOUNTER — Other Ambulatory Visit: Payer: Self-pay | Admitting: Orthopedic Surgery

## 2013-02-06 ENCOUNTER — Encounter (HOSPITAL_BASED_OUTPATIENT_CLINIC_OR_DEPARTMENT_OTHER): Payer: Self-pay | Admitting: *Deleted

## 2013-02-06 NOTE — Pre-Procedure Instructions (Signed)
To come for BMET 

## 2013-02-07 NOTE — Progress Notes (Signed)
Chart reviewed by dr crews, ok for dsc.

## 2013-02-12 ENCOUNTER — Encounter (HOSPITAL_BASED_OUTPATIENT_CLINIC_OR_DEPARTMENT_OTHER)
Admission: RE | Admit: 2013-02-12 | Discharge: 2013-02-12 | Disposition: A | Discharge status: Home / Self Care | Payer: Medicare Other | Source: Ambulatory Visit | Attending: Orthopedic Surgery | Admitting: Orthopedic Surgery

## 2013-02-12 LAB — BASIC METABOLIC PANEL
GFR calc Af Amer: >90 mL/min (ref >90)
GFR calc non Af Amer: 90 mL/min — ABNORMAL LOW (ref >90)
Potassium: 4.4 mEq/L (ref 3.5–5.1)
Sodium: 137 mEq/L (ref 135–145)

## 2013-02-13 ENCOUNTER — Encounter (HOSPITAL_BASED_OUTPATIENT_CLINIC_OR_DEPARTMENT_OTHER): Payer: Self-pay | Admitting: *Deleted

## 2013-02-13 ENCOUNTER — Encounter (HOSPITAL_BASED_OUTPATIENT_CLINIC_OR_DEPARTMENT_OTHER)
Admission: RE | Disposition: A | Discharge status: Home / Self Care | Payer: Self-pay | Source: Ambulatory Visit | Attending: Orthopedic Surgery

## 2013-02-13 ENCOUNTER — Ambulatory Visit (HOSPITAL_BASED_OUTPATIENT_CLINIC_OR_DEPARTMENT_OTHER)
Admission: RE | Admit: 2013-02-13 | Discharge: 2013-02-13 | Disposition: A | Discharge status: Home / Self Care | Payer: Medicare Other | Source: Ambulatory Visit | Attending: Orthopedic Surgery | Admitting: Orthopedic Surgery

## 2013-02-13 DIAGNOSIS — W64XXXA Exposure to other animate mechanical forces, initial encounter: Secondary | ICD-10-CM | POA: Insufficient documentation

## 2013-02-13 DIAGNOSIS — Z538 Procedure and treatment not carried out for other reasons: Secondary | ICD-10-CM | POA: Insufficient documentation

## 2013-02-13 DIAGNOSIS — G5600 Carpal tunnel syndrome, unspecified upper limb: Secondary | ICD-10-CM | POA: Insufficient documentation

## 2013-02-13 DIAGNOSIS — IMO0002 Reserved for concepts with insufficient information to code with codable children: Secondary | ICD-10-CM | POA: Insufficient documentation

## 2013-02-13 HISTORY — DX: Presence of dental prosthetic device (complete) (partial): Z97.2

## 2013-02-13 HISTORY — DX: Other psychoactive substance abuse, in remission: F19.11

## 2013-02-13 HISTORY — DX: Type 2 diabetes mellitus with diabetic neuropathy, unspecified: E11.40

## 2013-02-13 HISTORY — DX: Diverticulosis of intestine, part unspecified, without perforation or abscess without bleeding: K57.90

## 2013-02-13 HISTORY — DX: Other specified joint disorders, unspecified wrist: M25.839

## 2013-02-13 HISTORY — DX: Chronic obstructive pulmonary disease, unspecified: J44.9

## 2013-02-13 HISTORY — DX: Carpal tunnel syndrome, right upper limb: G56.01

## 2013-02-13 HISTORY — DX: Hyperlipidemia, unspecified: E78.5

## 2013-02-13 LAB — POCT HEMOGLOBIN-HEMACUE: Hemoglobin: 11.7 g/dL — ABNORMAL LOW (ref 12.0–15.0)

## 2013-02-13 SURGERY — CANCELLED PROCEDURE
Site: Wrist | Laterality: Right

## 2013-02-13 MED ORDER — FENTANYL CITRATE 0.05 MG/ML IJ SOLN: 50.0000 ug | INTRAMUSCULAR | Status: DC | PRN

## 2013-02-13 MED ORDER — CHLORHEXIDINE GLUCONATE 4 % EX LIQD: 60.0000 mL | Freq: Once | CUTANEOUS | Status: DC

## 2013-02-13 MED ORDER — MIDAZOLAM HCL 2 MG/ML PO SYRP: 12.0000 mg | ORAL_SOLUTION | Freq: Once | ORAL | Status: DC | PRN

## 2013-02-13 MED ORDER — LACTATED RINGERS IV SOLN: INTRAVENOUS | Status: DC

## 2013-02-13 MED ORDER — VANCOMYCIN HCL IN DEXTROSE 1-5 GM/200ML-% IV SOLN: 1000.0000 mg | INTRAVENOUS | Status: DC

## 2013-02-13 MED ORDER — MIDAZOLAM HCL 2 MG/2ML IJ SOLN: 1.0000 mg | INTRAMUSCULAR | Status: DC | PRN

## 2013-02-13 SURGICAL SUPPLY — 72 items
BAG DECANTER FOR FLEXI CONT (MISCELLANEOUS) IMPLANT
BANDAGE GAUZE ELAST BULKY 4 IN (GAUZE/BANDAGES/DRESSINGS) IMPLANT
BLADE AVERAGE 25X9 (BLADE) IMPLANT
BLADE MINI RND TIP GREEN BEAV (BLADE) IMPLANT
BLADE SURG 15 STRL LF DISP TIS (BLADE) IMPLANT
BLADE SURG 15 STRL SS (BLADE)
BNDG COHESIVE 3X5 TAN STRL LF (GAUZE/BANDAGES/DRESSINGS) IMPLANT
BNDG ESMARK 4X9 LF (GAUZE/BANDAGES/DRESSINGS) IMPLANT
BUR EGG 3PK/BX (BURR) IMPLANT
CANISTER SUCTION 1200CC (MISCELLANEOUS) IMPLANT
CHLORAPREP W/TINT 26ML (MISCELLANEOUS) IMPLANT
CLOTH BEACON ORANGE TIMEOUT ST (SAFETY) IMPLANT
CORDS BIPOLAR (ELECTRODE) IMPLANT
COVER MAYO STAND STRL (DRAPES) IMPLANT
COVER TABLE BACK 60X90 (DRAPES) IMPLANT
CUFF TOURNIQUET SINGLE 18IN (TOURNIQUET CUFF) IMPLANT
DECANTER SPIKE VIAL GLASS SM (MISCELLANEOUS) IMPLANT
DRAIN TLS ROUND 10FR (DRAIN) IMPLANT
DRAPE EXTREMITY T 121X128X90 (DRAPE) IMPLANT
DRAPE INCISE IOBAN 66X45 STRL (DRAPES) IMPLANT
DRAPE OEC MINIVIEW 54X84 (DRAPES) IMPLANT
DRAPE PED LAPAROTOMY (DRAPES) IMPLANT
DRAPE SURG 17X23 STRL (DRAPES) IMPLANT
DRSG KUZMA FLUFF (GAUZE/BANDAGES/DRESSINGS) IMPLANT
DRSG PAD ABDOMINAL 8X10 ST (GAUZE/BANDAGES/DRESSINGS) IMPLANT
ELECT REM PT RETURN 9FT ADLT (ELECTROSURGICAL)
ELECTRODE REM PT RTRN 9FT ADLT (ELECTROSURGICAL) IMPLANT
GAUZE SPONGE 4X4 16PLY XRAY LF (GAUZE/BANDAGES/DRESSINGS) IMPLANT
GAUZE XEROFORM 1X8 LF (GAUZE/BANDAGES/DRESSINGS) IMPLANT
GLOVE BIO SURGEON STRL SZ 6.5 (GLOVE) IMPLANT
GLOVE BIOGEL PI IND STRL 8 (GLOVE) IMPLANT
GLOVE BIOGEL PI IND STRL 8.5 (GLOVE) IMPLANT
GLOVE BIOGEL PI INDICATOR 8 (GLOVE)
GLOVE BIOGEL PI INDICATOR 8.5 (GLOVE)
GLOVE SURG ORTHO 8.0 STRL STRW (GLOVE) IMPLANT
GOWN BRE IMP PREV XXLGXLNG (GOWN DISPOSABLE) IMPLANT
GOWN PREVENTION PLUS XLARGE (GOWN DISPOSABLE) IMPLANT
LOOP VESSEL MAXI BLUE (MISCELLANEOUS) IMPLANT
NEEDLE 27GAX1X1/2 (NEEDLE) IMPLANT
NS IRRIG 1000ML POUR BTL (IV SOLUTION) IMPLANT
PACK BASIN DAY SURGERY FS (CUSTOM PROCEDURE TRAY) IMPLANT
PAD CAST 3X4 CTTN HI CHSV (CAST SUPPLIES) IMPLANT
PADDING CAST ABS 3INX4YD NS (CAST SUPPLIES)
PADDING CAST ABS 4INX4YD NS (CAST SUPPLIES)
PADDING CAST ABS COTTON 3X4 (CAST SUPPLIES) IMPLANT
PADDING CAST ABS COTTON 4X4 ST (CAST SUPPLIES) IMPLANT
PADDING CAST COTTON 3X4 STRL (CAST SUPPLIES)
PENCIL BUTTON HOLSTER BLD 10FT (ELECTRODE) IMPLANT
SLEEVE SCD COMPRESS KNEE MED (MISCELLANEOUS) IMPLANT
SPLINT PLASTER CAST XFAST 3X15 (CAST SUPPLIES) IMPLANT
SPLINT PLASTER XTRA FASTSET 3X (CAST SUPPLIES)
SPONGE GAUZE 4X4 12PLY (GAUZE/BANDAGES/DRESSINGS) IMPLANT
SPONGE LAP 4X18 X RAY DECT (DISPOSABLE) IMPLANT
STOCKINETTE 4X48 STRL (DRAPES) IMPLANT
SUCTION FRAZIER TIP 10 FR DISP (SUCTIONS) IMPLANT
SUT CHROMIC 4 0 RB 1X27 (SUTURE) IMPLANT
SUT CHROMIC 5 0 P 3 (SUTURE) IMPLANT
SUT ETHIBOND 3-0 V-5 (SUTURE) IMPLANT
SUT MERSILENE 4 0 P 3 (SUTURE) IMPLANT
SUT NOVA NAB GS-22 2 0 T19 (SUTURE) IMPLANT
SUT VIC AB 0 CT1 27 (SUTURE)
SUT VIC AB 0 CT1 27XBRD ANBCTR (SUTURE) IMPLANT
SUT VIC AB 2-0 SH 27 (SUTURE)
SUT VIC AB 2-0 SH 27XBRD (SUTURE) IMPLANT
SUT VICRYL 4-0 PS2 18IN ABS (SUTURE) IMPLANT
SUT VICRYL RAPID 5 0 P 3 (SUTURE) IMPLANT
SUT VICRYL RAPIDE 4/0 PS 2 (SUTURE) IMPLANT
SYR BULB 3OZ (MISCELLANEOUS) IMPLANT
SYR CONTROL 10ML LL (SYRINGE) IMPLANT
TOWEL OR 17X24 6PK STRL BLUE (TOWEL DISPOSABLE) IMPLANT
TUBE CONNECTING 20X1/4 (TUBING) IMPLANT
UNDERPAD 30X30 INCONTINENT (UNDERPADS AND DIAPERS) IMPLANT

## 2013-02-13 NOTE — Progress Notes (Signed)
Dr Merlyn Lot here to evaluate patient's dog scratches on operative arm. Case cancelled by Dr Merlyn Lot. Patient to take RX for antibiotic and reschedule. Advised patient no contact with dogs prior to surgery.

## 2013-02-16 ENCOUNTER — Encounter (HOSPITAL_BASED_OUTPATIENT_CLINIC_OR_DEPARTMENT_OTHER): Payer: Self-pay | Admitting: *Deleted

## 2013-02-16 ENCOUNTER — Other Ambulatory Visit: Payer: Self-pay | Admitting: Orthopedic Surgery

## 2013-02-19 NOTE — Pre-Procedure Instructions (Signed)
EKG done 11/20/2012; BMET done 02/12/2013

## 2013-02-21 ENCOUNTER — Ambulatory Visit (HOSPITAL_BASED_OUTPATIENT_CLINIC_OR_DEPARTMENT_OTHER)
Admission: RE | Admit: 2013-02-21 | Discharge: 2013-02-22 | Disposition: A | Discharge status: Nursing Home (Includes ICF) | Payer: Medicare Other | Source: Ambulatory Visit | Attending: Orthopedic Surgery | Admitting: Orthopedic Surgery

## 2013-02-21 ENCOUNTER — Encounter (HOSPITAL_BASED_OUTPATIENT_CLINIC_OR_DEPARTMENT_OTHER): Payer: Self-pay | Admitting: Orthopedic Surgery

## 2013-02-21 ENCOUNTER — Encounter (HOSPITAL_BASED_OUTPATIENT_CLINIC_OR_DEPARTMENT_OTHER)
Admission: RE | Disposition: A | Discharge status: Nursing Home (Includes ICF) | Payer: Self-pay | Source: Ambulatory Visit | Attending: Orthopedic Surgery

## 2013-02-21 ENCOUNTER — Ambulatory Visit (HOSPITAL_BASED_OUTPATIENT_CLINIC_OR_DEPARTMENT_OTHER): Payer: Medicare Other | Admitting: Certified Registered Nurse Anesthetist

## 2013-02-21 ENCOUNTER — Encounter (HOSPITAL_BASED_OUTPATIENT_CLINIC_OR_DEPARTMENT_OTHER): Payer: Self-pay | Admitting: Certified Registered Nurse Anesthetist

## 2013-02-21 DIAGNOSIS — Y831 Surgical operation with implant of artificial internal device as the cause of abnormal reaction of the patient, or of later complication, without mention of misadventure at the time of the procedure: Secondary | ICD-10-CM | POA: Insufficient documentation

## 2013-02-21 DIAGNOSIS — E785 Hyperlipidemia, unspecified: Secondary | ICD-10-CM | POA: Insufficient documentation

## 2013-02-21 DIAGNOSIS — E1142 Type 2 diabetes mellitus with diabetic polyneuropathy: Secondary | ICD-10-CM | POA: Insufficient documentation

## 2013-02-21 DIAGNOSIS — F17200 Nicotine dependence, unspecified, uncomplicated: Secondary | ICD-10-CM | POA: Insufficient documentation

## 2013-02-21 DIAGNOSIS — Z888 Allergy status to other drugs, medicaments and biological substances status: Secondary | ICD-10-CM | POA: Insufficient documentation

## 2013-02-21 DIAGNOSIS — J449 Chronic obstructive pulmonary disease, unspecified: Secondary | ICD-10-CM | POA: Insufficient documentation

## 2013-02-21 DIAGNOSIS — Z9181 History of falling: Secondary | ICD-10-CM | POA: Insufficient documentation

## 2013-02-21 DIAGNOSIS — E1149 Type 2 diabetes mellitus with other diabetic neurological complication: Secondary | ICD-10-CM | POA: Insufficient documentation

## 2013-02-21 DIAGNOSIS — I509 Heart failure, unspecified: Secondary | ICD-10-CM | POA: Insufficient documentation

## 2013-02-21 DIAGNOSIS — E669 Obesity, unspecified: Secondary | ICD-10-CM | POA: Insufficient documentation

## 2013-02-21 DIAGNOSIS — F411 Generalized anxiety disorder: Secondary | ICD-10-CM | POA: Insufficient documentation

## 2013-02-21 DIAGNOSIS — F3289 Other specified depressive episodes: Secondary | ICD-10-CM | POA: Insufficient documentation

## 2013-02-21 DIAGNOSIS — G40909 Epilepsy, unspecified, not intractable, without status epilepticus: Secondary | ICD-10-CM | POA: Insufficient documentation

## 2013-02-21 DIAGNOSIS — F329 Major depressive disorder, single episode, unspecified: Secondary | ICD-10-CM | POA: Insufficient documentation

## 2013-02-21 DIAGNOSIS — Z886 Allergy status to analgesic agent status: Secondary | ICD-10-CM | POA: Insufficient documentation

## 2013-02-21 DIAGNOSIS — Z6834 Body mass index (BMI) 34.0-34.9, adult: Secondary | ICD-10-CM | POA: Insufficient documentation

## 2013-02-21 DIAGNOSIS — M25839 Other specified joint disorders, unspecified wrist: Secondary | ICD-10-CM | POA: Insufficient documentation

## 2013-02-21 DIAGNOSIS — Z881 Allergy status to other antibiotic agents status: Secondary | ICD-10-CM | POA: Insufficient documentation

## 2013-02-21 DIAGNOSIS — K219 Gastro-esophageal reflux disease without esophagitis: Secondary | ICD-10-CM | POA: Insufficient documentation

## 2013-02-21 DIAGNOSIS — I951 Orthostatic hypotension: Secondary | ICD-10-CM | POA: Insufficient documentation

## 2013-02-21 DIAGNOSIS — G709 Myoneural disorder, unspecified: Secondary | ICD-10-CM | POA: Insufficient documentation

## 2013-02-21 DIAGNOSIS — J4489 Other specified chronic obstructive pulmonary disease: Secondary | ICD-10-CM | POA: Insufficient documentation

## 2013-02-21 DIAGNOSIS — M25539 Pain in unspecified wrist: Secondary | ICD-10-CM | POA: Insufficient documentation

## 2013-02-21 DIAGNOSIS — T8489XA Other specified complication of internal orthopedic prosthetic devices, implants and grafts, initial encounter: Secondary | ICD-10-CM | POA: Insufficient documentation

## 2013-02-21 DIAGNOSIS — G5600 Carpal tunnel syndrome, unspecified upper limb: Secondary | ICD-10-CM | POA: Insufficient documentation

## 2013-02-21 DIAGNOSIS — G4730 Sleep apnea, unspecified: Secondary | ICD-10-CM | POA: Insufficient documentation

## 2013-02-21 HISTORY — PX: CARPAL TUNNEL RELEASE: SHX101

## 2013-02-21 HISTORY — PX: HARDWARE REMOVAL: SHX979

## 2013-02-21 HISTORY — PX: WRIST OSTEOTOMY: SHX1093

## 2013-02-21 LAB — GLUCOSE, CAPILLARY

## 2013-02-21 SURGERY — REMOVAL, HARDWARE
Anesthesia: Regional | Site: Wrist | Laterality: Right | Wound class: Clean

## 2013-02-21 MED ORDER — NICOTINE 21 MG/24HR TD PT24
21.0000 mg | MEDICATED_PATCH | TRANSDERMAL | Status: DC
  Administered 2013-02-22: 21 mg via TRANSDERMAL

## 2013-02-21 MED ORDER — INSULIN GLARGINE 100 UNIT/ML ~~LOC~~ SOLN
26.0000 [IU] | Freq: Every day | SUBCUTANEOUS | Status: DC
  Administered 2013-02-21: 26 [IU] via SUBCUTANEOUS

## 2013-02-21 MED ORDER — QUETIAPINE FUMARATE 300 MG PO TABS
300.0000 mg | ORAL_TABLET | Freq: Every day | ORAL | Status: DC
  Administered 2013-02-21: 300 mg via ORAL

## 2013-02-21 MED ORDER — MIDAZOLAM HCL 2 MG/ML PO SYRP: 12.0000 mg | ORAL_SOLUTION | Freq: Once | ORAL | Status: DC | PRN

## 2013-02-21 MED ORDER — OXYCODONE HCL 5 MG PO TABS: 5.0000 mg | ORAL_TABLET | Freq: Once | ORAL | Status: DC | PRN

## 2013-02-21 MED ORDER — LACTATED RINGERS IV SOLN
INTRAVENOUS | Status: DC
  Administered 2013-02-21 (×2): via INTRAVENOUS

## 2013-02-21 MED ORDER — ONDANSETRON HCL 4 MG/2ML IJ SOLN
INTRAMUSCULAR | Status: DC | PRN
  Administered 2013-02-21: 4 mg via INTRAVENOUS

## 2013-02-21 MED ORDER — OXYCODONE HCL 5 MG/5ML PO SOLN: 5.0000 mg | Freq: Once | ORAL | Status: DC | PRN

## 2013-02-21 MED ORDER — LEVETIRACETAM 500 MG PO TABS
500.0000 mg | ORAL_TABLET | Freq: Two times a day (BID) | ORAL | Status: DC
  Administered 2013-02-22: 500 mg via ORAL

## 2013-02-21 MED ORDER — HYDROMORPHONE HCL PF 1 MG/ML IJ SOLN: 0.2500 mg | INTRAMUSCULAR | Status: DC | PRN

## 2013-02-21 MED ORDER — LORATADINE 10 MG PO TABS: 10.0000 mg | ORAL_TABLET | Freq: Every day | ORAL | Status: DC

## 2013-02-21 MED ORDER — HEPARIN SODIUM (PORCINE) 5000 UNIT/ML IJ SOLN
5000.0000 [IU] | Freq: Three times a day (TID) | INTRAMUSCULAR | Status: DC
Start: 2013-02-21 — End: 2013-02-21

## 2013-02-21 MED ORDER — VANCOMYCIN HCL IN DEXTROSE 1-5 GM/200ML-% IV SOLN
1000.0000 mg | INTRAVENOUS | Status: AC
  Administered 2013-02-21: 1000 mg via INTRAVENOUS

## 2013-02-21 MED ORDER — METFORMIN HCL ER 750 MG PO TB24: 1500.0000 mg | ORAL_TABLET | Freq: Every day | ORAL | Status: DC

## 2013-02-21 MED ORDER — DEXAMETHASONE SODIUM PHOSPHATE 10 MG/ML IJ SOLN
INTRAMUSCULAR | Status: DC | PRN
  Administered 2013-02-21: 4 mg via INTRAVENOUS

## 2013-02-21 MED ORDER — ESTROGENS CONJUGATED 0.625 MG PO TABS: 0.6250 mg | ORAL_TABLET | Freq: Every day | ORAL | Status: DC

## 2013-02-21 MED ORDER — LIDOCAINE HCL (CARDIAC) 20 MG/ML IV SOLN
INTRAVENOUS | Status: DC | PRN
  Administered 2013-02-21: 60 mg via INTRAVENOUS

## 2013-02-21 MED ORDER — ATORVASTATIN CALCIUM 10 MG PO TABS
10.0000 mg | ORAL_TABLET | Freq: Every day | ORAL | Status: DC
  Administered 2013-02-22: 10 mg via ORAL

## 2013-02-21 MED ORDER — ROPIVACAINE HCL 5 MG/ML IJ SOLN
INTRAMUSCULAR | Status: DC | PRN
  Administered 2013-02-21: 35 mL via EPIDURAL

## 2013-02-21 MED ORDER — SUCCINYLCHOLINE CHLORIDE 20 MG/ML IJ SOLN
INTRAMUSCULAR | Status: DC | PRN
  Administered 2013-02-21: 100 mg via INTRAVENOUS

## 2013-02-21 MED ORDER — VENLAFAXINE HCL ER 150 MG PO CP24: 150.0000 mg | ORAL_CAPSULE | Freq: Every day | ORAL | Status: DC

## 2013-02-21 MED ORDER — LEVETIRACETAM 500 MG PO TABS: 500.0000 mg | ORAL_TABLET | Freq: Every day | ORAL | Status: DC

## 2013-02-21 MED ORDER — SODIUM CHLORIDE 0.9 % IV SOLN
INTRAVENOUS | Status: DC
  Administered 2013-02-21: 18:00:00 via INTRAVENOUS

## 2013-02-21 MED ORDER — PYRIDOSTIGMINE BROMIDE 60 MG PO TABS
60.0000 mg | ORAL_TABLET | Freq: Two times a day (BID) | ORAL | Status: DC
  Administered 2013-02-21 – 2013-02-22 (×2): 60 mg via ORAL

## 2013-02-21 MED ORDER — ASPIRIN EC 81 MG PO TBEC
81.0000 mg | DELAYED_RELEASE_TABLET | Freq: Two times a day (BID) | ORAL | Status: DC
  Administered 2013-02-21 – 2013-02-22 (×2): 81 mg via ORAL

## 2013-02-21 MED ORDER — OXYCODONE-ACETAMINOPHEN 7.5-325 MG PO TABS: 1.0000 | ORAL_TABLET | ORAL | Status: DC | PRN

## 2013-02-21 MED ORDER — MELOXICAM 7.5 MG PO TABS: 7.5000 mg | ORAL_TABLET | Freq: Every day | ORAL | Status: DC

## 2013-02-21 MED ORDER — CHLORHEXIDINE GLUCONATE 4 % EX LIQD: 60.0000 mL | Freq: Once | CUTANEOUS | Status: DC

## 2013-02-21 MED ORDER — ALBUTEROL SULFATE HFA 108 (90 BASE) MCG/ACT IN AERS
INHALATION_SPRAY | RESPIRATORY_TRACT | Status: DC | PRN
  Administered 2013-02-21: 5 via RESPIRATORY_TRACT

## 2013-02-21 MED ORDER — 0.9 % SODIUM CHLORIDE (POUR BTL) OPTIME
TOPICAL | Status: DC | PRN
  Administered 2013-02-21: 1000 mL

## 2013-02-21 MED ORDER — MIDAZOLAM HCL 2 MG/2ML IJ SOLN: 1.0000 mg | INTRAMUSCULAR | Status: DC | PRN

## 2013-02-21 MED ORDER — EXENATIDE 10 MCG/0.04ML ~~LOC~~ SOPN: 10.0000 ug | PEN_INJECTOR | Freq: Two times a day (BID) | SUBCUTANEOUS | Status: DC

## 2013-02-21 MED ORDER — PREGABALIN 100 MG PO CAPS
600.0000 mg | ORAL_CAPSULE | Freq: Every day | ORAL | Status: DC
  Administered 2013-02-21: 600 mg via ORAL

## 2013-02-21 MED ORDER — MAGNESIUM 200 MG PO TABS: 1.0000 | ORAL_TABLET | Freq: Every day | ORAL | Status: DC

## 2013-02-21 MED ORDER — ALBUTEROL SULFATE HFA 108 (90 BASE) MCG/ACT IN AERS: 2.0000 | INHALATION_SPRAY | RESPIRATORY_TRACT | Status: DC | PRN

## 2013-02-21 MED ORDER — FENTANYL CITRATE 0.05 MG/ML IJ SOLN
50.0000 ug | INTRAMUSCULAR | Status: DC | PRN
  Administered 2013-02-21: 50 ug via INTRAVENOUS

## 2013-02-21 MED ORDER — PROPOFOL 10 MG/ML IV BOLUS
INTRAVENOUS | Status: DC | PRN
  Administered 2013-02-21: 100 mg via INTRAVENOUS

## 2013-02-21 MED ORDER — VANCOMYCIN HCL IN DEXTROSE 1-5 GM/200ML-% IV SOLN: 1000.0000 mg | INTRAVENOUS | Status: DC

## 2013-02-21 SURGICAL SUPPLY — 81 items
BAG DECANTER FOR FLEXI CONT (MISCELLANEOUS) IMPLANT
BANDAGE GAUZE ELAST BULKY 4 IN (GAUZE/BANDAGES/DRESSINGS) ×2 IMPLANT
BIT DRILL ACUTRAK MICRO 2 (BIT) ×1 IMPLANT
BLADE AVERAGE 25X9 (BLADE) ×2 IMPLANT
BLADE MINI RND TIP GREEN BEAV (BLADE) IMPLANT
BLADE SURG 15 STRL LF DISP TIS (BLADE) ×2 IMPLANT
BLADE SURG 15 STRL SS (BLADE) ×2
BNDG COHESIVE 3X5 TAN STRL LF (GAUZE/BANDAGES/DRESSINGS) ×4 IMPLANT
BNDG ESMARK 4X9 LF (GAUZE/BANDAGES/DRESSINGS) ×2 IMPLANT
BUR EGG 3PK/BX (BURR) IMPLANT
CANISTER SUCTION 1200CC (MISCELLANEOUS) IMPLANT
CHLORAPREP W/TINT 26ML (MISCELLANEOUS) ×2 IMPLANT
CLOTH BEACON ORANGE TIMEOUT ST (SAFETY) ×2 IMPLANT
CORDS BIPOLAR (ELECTRODE) ×2 IMPLANT
COVER MAYO STAND STRL (DRAPES) ×2 IMPLANT
COVER TABLE BACK 60X90 (DRAPES) ×2 IMPLANT
CUFF TOURNIQUET SINGLE 18IN (TOURNIQUET CUFF) ×2 IMPLANT
DECANTER SPIKE VIAL GLASS SM (MISCELLANEOUS) IMPLANT
DRAIN TLS ROUND 10FR (DRAIN) IMPLANT
DRAPE EXTREMITY T 121X128X90 (DRAPE) ×2 IMPLANT
DRAPE INCISE IOBAN 66X45 STRL (DRAPES) IMPLANT
DRAPE OEC MINIVIEW 54X84 (DRAPES) ×2 IMPLANT
DRAPE PED LAPAROTOMY (DRAPES) IMPLANT
DRAPE SURG 17X23 STRL (DRAPES) ×2 IMPLANT
DRILL ACUTRAK MICRO 2 (BIT) ×2
DRSG KUZMA FLUFF (GAUZE/BANDAGES/DRESSINGS) ×2 IMPLANT
DRSG PAD ABDOMINAL 8X10 ST (GAUZE/BANDAGES/DRESSINGS) IMPLANT
ELECT REM PT RETURN 9FT ADLT (ELECTROSURGICAL)
ELECTRODE REM PT RTRN 9FT ADLT (ELECTROSURGICAL) IMPLANT
GAUZE SPONGE 4X4 16PLY XRAY LF (GAUZE/BANDAGES/DRESSINGS) IMPLANT
GAUZE XEROFORM 1X8 LF (GAUZE/BANDAGES/DRESSINGS) ×2 IMPLANT
GLOVE BIO SURGEON STRL SZ 6.5 (GLOVE) ×2 IMPLANT
GLOVE BIOGEL PI IND STRL 7.0 (GLOVE) ×1 IMPLANT
GLOVE BIOGEL PI IND STRL 8 (GLOVE) ×1 IMPLANT
GLOVE BIOGEL PI IND STRL 8.5 (GLOVE) ×1 IMPLANT
GLOVE BIOGEL PI INDICATOR 7.0 (GLOVE) ×1
GLOVE BIOGEL PI INDICATOR 8 (GLOVE) ×1
GLOVE BIOGEL PI INDICATOR 8.5 (GLOVE) ×1
GLOVE ECLIPSE 6.5 STRL STRAW (GLOVE) ×2 IMPLANT
GLOVE EXAM NITRILE PF MED BLUE (GLOVE) ×2 IMPLANT
GLOVE SURG ORTHO 8.0 STRL STRW (GLOVE) ×2 IMPLANT
GOWN BRE IMP PREV XXLGXLNG (GOWN DISPOSABLE) ×2 IMPLANT
GOWN PREVENTION PLUS XLARGE (GOWN DISPOSABLE) ×4 IMPLANT
LOOP VESSEL MAXI BLUE (MISCELLANEOUS) IMPLANT
NEEDLE 27GAX1X1/2 (NEEDLE) IMPLANT
NS IRRIG 1000ML POUR BTL (IV SOLUTION) ×2 IMPLANT
PACK BASIN DAY SURGERY FS (CUSTOM PROCEDURE TRAY) ×2 IMPLANT
PAD CAST 3X4 CTTN HI CHSV (CAST SUPPLIES) ×2 IMPLANT
PADDING CAST ABS 3INX4YD NS (CAST SUPPLIES) ×1
PADDING CAST ABS 4INX4YD NS (CAST SUPPLIES) ×1
PADDING CAST ABS COTTON 3X4 (CAST SUPPLIES) ×1 IMPLANT
PADDING CAST ABS COTTON 4X4 ST (CAST SUPPLIES) ×1 IMPLANT
PADDING CAST COTTON 3X4 STRL (CAST SUPPLIES) ×2
PENCIL BUTTON HOLSTER BLD 10FT (ELECTRODE) IMPLANT
SCREW ACUTRAK 2 MICRO 14MM (Screw) ×2 IMPLANT
SCREW ACUTRAK 2 MICRO 9MM (Screw) ×2 IMPLANT
SLEEVE SCD COMPRESS KNEE MED (MISCELLANEOUS) ×2 IMPLANT
SLING ARM FOAM STRAP LRG (SOFTGOODS) ×2 IMPLANT
SPLINT PLASTER CAST XFAST 3X15 (CAST SUPPLIES) ×24 IMPLANT
SPLINT PLASTER XTRA FASTSET 3X (CAST SUPPLIES) ×24
SPONGE GAUZE 4X4 12PLY (GAUZE/BANDAGES/DRESSINGS) ×2 IMPLANT
SPONGE LAP 4X18 X RAY DECT (DISPOSABLE) IMPLANT
STOCKINETTE 4X48 STRL (DRAPES) ×2 IMPLANT
SUCTION FRAZIER TIP 10 FR DISP (SUCTIONS) IMPLANT
SUT CHROMIC 4 0 RB 1X27 (SUTURE) IMPLANT
SUT CHROMIC 5 0 P 3 (SUTURE) IMPLANT
SUT ETHIBOND 3-0 V-5 (SUTURE) IMPLANT
SUT MERSILENE 4 0 P 3 (SUTURE) IMPLANT
SUT NOVA NAB GS-22 2 0 T19 (SUTURE) IMPLANT
SUT VIC AB 0 CT1 27 (SUTURE)
SUT VIC AB 0 CT1 27XBRD ANBCTR (SUTURE) IMPLANT
SUT VIC AB 2-0 SH 27 (SUTURE)
SUT VIC AB 2-0 SH 27XBRD (SUTURE) IMPLANT
SUT VICRYL 4-0 PS2 18IN ABS (SUTURE) ×4 IMPLANT
SUT VICRYL RAPID 5 0 P 3 (SUTURE) IMPLANT
SUT VICRYL RAPIDE 4/0 PS 2 (SUTURE) ×4 IMPLANT
SYR BULB 3OZ (MISCELLANEOUS) ×2 IMPLANT
SYR CONTROL 10ML LL (SYRINGE) IMPLANT
TOWEL OR 17X24 6PK STRL BLUE (TOWEL DISPOSABLE) ×2 IMPLANT
TUBE CONNECTING 20X1/4 (TUBING) IMPLANT
UNDERPAD 30X30 INCONTINENT (UNDERPADS AND DIAPERS) ×2 IMPLANT

## 2013-02-21 NOTE — Progress Notes (Signed)
Pt remains unresponsive with Oral Airway in place. Respirations slow, deep, unlabored. Pt does not react to stimulation. Dr. Sampson Goon called and at bedside. Inserted nasal airway without difficulty. Little response to insertion. Opened eyes but no verbal response. Oral airway removed by Dr. Sampson Goon. Respiratory status remains without change with nasal airway in place. All VSS.  Pt continues unresponsive to verbal stimuli.

## 2013-02-21 NOTE — H&P (Signed)
Joyce Richardson is a 67 year-old left-hand dominant female complaining of pain in her right wrist.  It appears to be primarily distal radial ulnar joint area.  She suffered a fall, a fracture of her distal radius in approximately June or early July, 2013.   She underwent plate fixation in July.  She has been wearing a brace.  She had history of carpal tunnel syndrome, but no prior history of injury to that wrist.  She subsequently saw Dr. Magnus Ivan who has recommended referral. She is being treated by Dr. Thyra Breed for pain of multiple joints.  She is complaining primarily of pronation/supination with pain on her ulnar side. She is currently taking Norco and Motrin for this.  She has not taken other nonsteroidal anti-inflammatories.  She is complaining of a chronic pain problem.  She has history of diabetes, no history of thyroid problems, arthritis or gout. She complains of an intermittent, moderate to severe sharp, stabbing, throbbing, aching pain with a feeling of numbness and weakness.  She states it has gotten somewhat better, awakens her at night, rest makes it better.  Activity makes this worse. She has had her CT scan done and this reveals that she does have a long ulna with changes in the ulnar head indicative of probable arthritis and ulnocarpal abutment.  She has had her nerve conductions done revealing bilateral carpal tunnel syndrome with a motor delay of 5.49 on the left, 4.5 on the right; sensory delay of 2.8 on the right side, 3.3 on the left.    ALLERGIES:    Penicillin, Tegretol, pain medications, anti-anxiolytics and stimulants.  MEDICATIONS:    Byetta, Lantus, Metformin HCL ER, omeprazole, atorvastatin, aspirin, pyridostigmine, oxygen, nicotine patch, Keppra XR, Lyrica, Seroquel, Effexor, Premarin, hydrocodone/APAP, ibuprofen, ProAir, Wal-itin, multivitamins, magnesium and calcium +D3.  SURGICAL HISTORY:    Right distal radius fracture, right shoulder and cholecystectomy.  FAMILY  MEDICAL HISTORY:    Positive for diabetes, heart disease, otherwise negative.  SOCIAL HISTORY:     She smokes 2-4 cigarettes a day and is advised to quit.  She does not drink.  She is married, retired.   REVIEW OF SYSTEMS:     Positive for weight loss, loss of appetite, glasses, shortness of breath, seizures, sleep disorder, lumps, easy bleeding and bruising. Joyce Richardson is an 67 y.o. female.   Chief Complaint: cts rt, uc aburment s/p dvr HPI: see above  Past Medical History  Diagnosis Date  . Multiple falls   . Orthostatic hypotension 05/17/12  . HYPERTENSION     hx. of; no longer on med. due to orthostatic hypotension  . ANXIETY   . Chronic diastolic heart failure   . DIABETES MELLITUS, TYPE II 2001    IDDM  . Carpal tunnel syndrome of right wrist 01/2013  . 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 Diabetic neuropathy     feet  . Hyperlipidemia   . History of substance abuse     Etoh and narcotics  . Diverticulosis   . COPD (chronic obstructive pulmonary disease)     on home O2 at night, 2l/min.; denies SOB with daily activities  . DEPRESSION   . GERD   . SEIZURE DISORDER     no seizures in 3 yrs.  . Wears dentures     upper  . Sleep apnea 05/26/2012    sleep study:  AHI 7.5; RDI 10.7; CPAP not recommended    Past Surgical History  Procedure Laterality Date  .  Hemorrhoid surgery    . Wrist fracture surgery Right 02/2012  . Appendectomy  1970's  . Vaginal hysterectomy    . Cataract extraction w/ intraocular lens  implant, bilateral Bilateral 2012  . Cholecystectomy  10/26/2000    lap. chole.  . Shoulder arthroscopy w/ subacromial decompression and distal clavicle excision Left 12/28/2011    Family History  Problem Relation Age of Onset  . Coronary artery disease Father    Social History:  reports that she has been smoking Cigarettes.  She has been smoking about 0.00 packs per day for the past 30 years. She has never used smokeless tobacco. She reports  that she does not drink alcohol or use illicit drugs.  Allergies:  Allergies  Allergen Reactions  . Penicillins Swelling    OF LIPS  . Tegretol (Carbamazepine) Other (See Comments)    HYPONATREMIA    No prescriptions prior to admission    No results found for this or any previous visit (from the past 48 hour(s)).  No results found.   Pertinent items are noted in HPI.  Height 5' 0.5" (1.537 m), weight 81.194 kg (179 lb).  General appearance: alert, cooperative and appears stated age Head: Normocephalic, without obvious abnormality Neck: no JVD Resp: clear to auscultation bilaterally Cardio: regular rate and rhythm, S1, S2 normal, no murmur, click, rub or gallop GI: soft, non-tender; bowel sounds normal; no masses,  no organomegaly Extremities: extremities normal, atraumatic, no cyanosis or edema Pulses: 2+ and symmetric Skin: Skin color, texture, turgor normal. No rashes or lesions Neurologic: Grossly normal Incision/Wound: na  Assessment/Plan DX UC abutment and CTS rt She has a [employer] DVR plate on her right side. We would removal of that with carpal tunnel release on her right side along with a shortening osteotomy as described by Lorenda Hatchet.  The risks and complications were reviewed including infection, recurrence, injury to arteries, nerves, tendons, incomplete relief of symptoms and dystrophy.   This is scheduled  as an outpatient for ulnar head osteotomy with DVR plate removal and carpal tunnel release, right hand.  Sophie Quiles R 02/21/2013, 8:31 AM

## 2013-02-21 NOTE — Progress Notes (Signed)
Assisted Dr. Fitzgerald with right, ultrasound guided, supraclavicular block. Side rails up, monitors on throughout procedure. See vital signs in flow sheet. Tolerated Procedure well. 

## 2013-02-21 NOTE — Transfer of Care (Signed)
Immediate Anesthesia Transfer of Care Note  Patient: Joyce Richardson  Procedure(s) Performed: Procedure(s): REMOVAL DVR PLATE (Right) CARPAL TUNNEL RELEASE (Right) OSTEOTOMY ULNAR HEAD (Right)  Patient Location: PACU  Anesthesia Type:GA combined with regional for post-op pain  Level of Consciousness: awake and patient cooperative  Airway & Oxygen Therapy: Patient Spontanous Breathing and Patient connected to face mask oxygen  Post-op Assessment: Report given to PACU RN and Post -op Vital signs reviewed and stable  Post vital signs: Reviewed and stable  Complications: No apparent anesthesia complications

## 2013-02-21 NOTE — Op Note (Signed)
Dictation Number [id number]

## 2013-02-21 NOTE — Anesthesia Preprocedure Evaluation (Addendum)
Anesthesia Evaluation  Patient identified by MRN, date of birth, ID band Patient awake    Reviewed: Allergy & Precautions, H&P , NPO status , Patient's Chart, lab work & pertinent test results  Airway Mallampati: II TM Distance: >3 FB Neck ROM: Full    Dental no notable dental hx. (+) Edentulous Upper and Edentulous Lower   Pulmonary sleep apnea , COPD COPD inhaler,  breath sounds clear to auscultation  Pulmonary exam normal       Cardiovascular hypertension, On Medications +CHF + dysrhythmias Rhythm:Regular Rate:Normal     Neuro/Psych Seizures -, Well Controlled,  PSYCHIATRIC DISORDERS Anxiety Depression  Neuromuscular disease    GI/Hepatic Neg liver ROS, GERD-  Medicated and Controlled,  Endo/Other  diabetes, Type 1, Insulin Dependent and Oral Hypoglycemic AgentsMorbid obesity  Renal/GU negative Renal ROS  negative genitourinary   Musculoskeletal   Abdominal   Peds  Hematology negative hematology ROS (+)   Anesthesia Other Findings   Reproductive/Obstetrics negative OB ROS                        Anesthesia Physical Anesthesia Plan  ASA: III  Anesthesia Plan: General and Regional   Post-op Pain Management:    Induction: Intravenous  Airway Management Planned: LMA  Additional Equipment:   Intra-op Plan:   Post-operative Plan: Extubation in OR  Informed Consent: I have reviewed the patients History and Physical, chart, labs and discussed the procedure including the risks, benefits and alternatives for the proposed anesthesia with the patient or authorized representative who has indicated his/her understanding and acceptance.   Dental advisory given  Plan Discussed with: CRNA  Anesthesia Plan Comments:         Anesthesia Quick Evaluation

## 2013-02-21 NOTE — Anesthesia Procedure Notes (Addendum)
Anesthesia Regional Block:  Supraclavicular block  Pre-Anesthetic Checklist: ,, timeout performed, Correct Patient, Correct Site, Correct Laterality, Correct Procedure, Correct Position, site marked, Risks and benefits discussed, pre-op evaluation, post-op pain management  Laterality: Right  Prep: Maximum Sterile Barrier Precautions used and chloraprep       Needles:  Injection technique: Single-shot  Needle Type: Echogenic Stimulator Needle     Needle Length: 5cm 5 cm Needle Gauge: 22 and 22 G    Additional Needles:  Procedures: ultrasound guided (picture in chart) Supraclavicular block Narrative:  Start time: 02/21/2013 10:55 AM End time: 02/21/2013 11:08 AM Injection made incrementally with aspirations every 5 mL. Anesthesiologist: Fitzgerald,MD  Additional Notes: 2% Lidocaine skin wheel. Intercostobrachial block with 5cc of 0.5% Ropivicaine plain.  Supraclavicular block Procedure Name: Intubation Date/Time: 02/21/2013 11:30 AM Performed by: Tauriel Scronce D Pre-anesthesia Checklist: Patient identified, Emergency Drugs available, Suction available and Patient being monitored Patient Re-evaluated:Patient Re-evaluated prior to inductionOxygen Delivery Method: Circle System Utilized Preoxygenation: Pre-oxygenation with 100% oxygen Intubation Type: IV induction, Rapid sequence and Cricoid Pressure applied Ventilation: Mask ventilation without difficulty Laryngoscope Size: Mac and 3 Grade View: Grade I Tube type: Oral Tube size: 7.0 mm Number of attempts: 1 Airway Equipment and Method: stylet and oral airway Placement Confirmation: ETT inserted through vocal cords under direct vision,  positive ETCO2 and breath sounds checked- equal and bilateral Secured at: 21 cm Tube secured with: Tape Dental Injury: Teeth and Oropharynx as per pre-operative assessment

## 2013-02-21 NOTE — Brief Op Note (Signed)
02/21/2013  1:33 PM  PATIENT:  Gae Gallop  67 y.o. female  PRE-OPERATIVE DIAGNOSIS:  STATUS POST OPEN REDUCTION INTERNAL FIXATION RIGHT WRIST, ULNOCOLLATERAL ABUTMENT, CARPAL TUNNEL SYNDROME  POST-OPERATIVE DIAGNOSIS:  STATUS POST OPEN REDUCTION INTERNAL FIXATION RIGHT WRIST, ULNOCOLLATERAL ABUTMENT, CARPAL TUNNEL SYNDROME  PROCEDURE:  Procedure(s): REMOVAL DVR PLATE (Right) CARPAL TUNNEL RELEASE (Right) OSTEOTOMY ULNAR HEAD (Right)  SURGEON:  Surgeon(s) and Role:    * Nicki Reaper, MD - Primary  PHYSICIAN ASSISTANT:   ASSISTANTS: none   ANESTHESIA:   regional and general  EBL:  Total I/O In: 800 [I.V.:800] Out: -   BLOOD ADMINISTERED:none  DRAINS: none   LOCAL MEDICATIONS USED:  NONE  SPECIMEN:  No Specimen  DISPOSITION OF SPECIMEN:  N/A  COUNTS:  YES  TOURNIQUET:   Total Tourniquet Time Documented: Upper Arm (Right) - 97 minutes Total: Upper Arm (Right) - 97 minutes   DICTATION: .Other Dictation: Dictation Number [id number]  PLAN OF CARE: Discharge to home after PACU  PATIENT DISPOSITION:  PACU - hemodynamically stable.

## 2013-02-21 NOTE — Anesthesia Postprocedure Evaluation (Signed)
  Anesthesia Post-op Note  Patient: Joyce Richardson  Procedure(s) Performed: Procedure(s): REMOVAL DVR PLATE (Right) CARPAL TUNNEL RELEASE (Right) OSTEOTOMY ULNAR HEAD (Right)  Patient Location: PACU  Anesthesia Type:GA combined with regional for post-op pain  Level of Consciousness: awake and sedated  Airway and Oxygen Therapy: Patient Spontanous Breathing and Patient connected to nasal cannula oxygen  Post-op Pain: none  Post-op Assessment: Post-op Vital signs reviewed, Patient's Cardiovascular Status Stable, Respiratory Function Stable, Patent Airway and No signs of Nausea or vomiting  Post-op Vital Signs: Reviewed and stable  Complications: No apparent anesthesia complications

## 2013-02-22 ENCOUNTER — Encounter (HOSPITAL_BASED_OUTPATIENT_CLINIC_OR_DEPARTMENT_OTHER): Payer: Self-pay | Admitting: Orthopedic Surgery

## 2013-02-22 ENCOUNTER — Emergency Department (HOSPITAL_COMMUNITY)
Admission: EM | Admit: 2013-02-22 | Discharge: 2013-02-22 | Disposition: A | Discharge status: Home Health | Payer: Medicare Other | Attending: Emergency Medicine | Admitting: Emergency Medicine

## 2013-02-22 ENCOUNTER — Emergency Department (HOSPITAL_COMMUNITY): Payer: Medicare Other

## 2013-02-22 DIAGNOSIS — G40909 Epilepsy, unspecified, not intractable, without status epilepticus: Secondary | ICD-10-CM | POA: Insufficient documentation

## 2013-02-22 DIAGNOSIS — E1149 Type 2 diabetes mellitus with other diabetic neurological complication: Secondary | ICD-10-CM | POA: Insufficient documentation

## 2013-02-22 DIAGNOSIS — J4489 Other specified chronic obstructive pulmonary disease: Secondary | ICD-10-CM | POA: Insufficient documentation

## 2013-02-22 DIAGNOSIS — Z791 Long term (current) use of non-steroidal anti-inflammatories (NSAID): Secondary | ICD-10-CM | POA: Insufficient documentation

## 2013-02-22 DIAGNOSIS — Z7982 Long term (current) use of aspirin: Secondary | ICD-10-CM | POA: Insufficient documentation

## 2013-02-22 DIAGNOSIS — E785 Hyperlipidemia, unspecified: Secondary | ICD-10-CM | POA: Insufficient documentation

## 2013-02-22 DIAGNOSIS — Z8739 Personal history of other diseases of the musculoskeletal system and connective tissue: Secondary | ICD-10-CM | POA: Insufficient documentation

## 2013-02-22 DIAGNOSIS — G4730 Sleep apnea, unspecified: Secondary | ICD-10-CM | POA: Insufficient documentation

## 2013-02-22 DIAGNOSIS — F411 Generalized anxiety disorder: Secondary | ICD-10-CM | POA: Insufficient documentation

## 2013-02-22 DIAGNOSIS — Z9181 History of falling: Secondary | ICD-10-CM | POA: Insufficient documentation

## 2013-02-22 DIAGNOSIS — E1142 Type 2 diabetes mellitus with diabetic polyneuropathy: Secondary | ICD-10-CM | POA: Insufficient documentation

## 2013-02-22 DIAGNOSIS — K219 Gastro-esophageal reflux disease without esophagitis: Secondary | ICD-10-CM | POA: Insufficient documentation

## 2013-02-22 DIAGNOSIS — J449 Chronic obstructive pulmonary disease, unspecified: Secondary | ICD-10-CM | POA: Insufficient documentation

## 2013-02-22 DIAGNOSIS — Z8669 Personal history of other diseases of the nervous system and sense organs: Secondary | ICD-10-CM | POA: Insufficient documentation

## 2013-02-22 DIAGNOSIS — F3289 Other specified depressive episodes: Secondary | ICD-10-CM | POA: Insufficient documentation

## 2013-02-22 DIAGNOSIS — Z79899 Other long term (current) drug therapy: Secondary | ICD-10-CM | POA: Insufficient documentation

## 2013-02-22 DIAGNOSIS — Z98811 Dental restoration status: Secondary | ICD-10-CM | POA: Insufficient documentation

## 2013-02-22 DIAGNOSIS — R4182 Altered mental status, unspecified: Secondary | ICD-10-CM | POA: Insufficient documentation

## 2013-02-22 DIAGNOSIS — Z8719 Personal history of other diseases of the digestive system: Secondary | ICD-10-CM | POA: Insufficient documentation

## 2013-02-22 DIAGNOSIS — I10 Essential (primary) hypertension: Secondary | ICD-10-CM | POA: Insufficient documentation

## 2013-02-22 DIAGNOSIS — Z794 Long term (current) use of insulin: Secondary | ICD-10-CM | POA: Insufficient documentation

## 2013-02-22 DIAGNOSIS — I5032 Chronic diastolic (congestive) heart failure: Secondary | ICD-10-CM | POA: Insufficient documentation

## 2013-02-22 DIAGNOSIS — F329 Major depressive disorder, single episode, unspecified: Secondary | ICD-10-CM | POA: Insufficient documentation

## 2013-02-22 DIAGNOSIS — F17200 Nicotine dependence, unspecified, uncomplicated: Secondary | ICD-10-CM | POA: Insufficient documentation

## 2013-02-22 LAB — URINALYSIS, ROUTINE W REFLEX MICROSCOPIC
Bilirubin Urine: NEGATIVE
Glucose, UA: NEGATIVE mg/dL
Hgb urine dipstick: NEGATIVE
Ketones, ur: NEGATIVE mg/dL
Leukocytes, UA: NEGATIVE
pH: 7.5 (ref 5.0–8.0)

## 2013-02-22 LAB — COMPREHENSIVE METABOLIC PANEL
Albumin: 2.8 g/dL — ABNORMAL LOW (ref 3.5–5.2)
BUN: 14 mg/dL (ref 6–23)
Chloride: 103 mEq/L (ref 96–112)
Creatinine, Ser: 0.73 mg/dL (ref 0.50–1.10)
GFR calc non Af Amer: 86 mL/min — ABNORMAL LOW (ref >90)
Total Bilirubin: 0.2 mg/dL — ABNORMAL LOW (ref 0.3–1.2)

## 2013-02-22 LAB — CBC WITH DIFFERENTIAL/PLATELET
Basophils Relative: 0 % (ref 0–1)
Eosinophils Relative: 0 % (ref 0–5)
HCT: 32.6 % — ABNORMAL LOW (ref 36.0–46.0)
Hemoglobin: 10.6 g/dL — ABNORMAL LOW (ref 12.0–15.0)
MCH: 27.2 pg (ref 26.0–34.0)
MCHC: 32.5 g/dL (ref 30.0–36.0)
MCV: 83.6 fL (ref 78.0–100.0)
Monocytes Absolute: 1.2 10*3/uL — ABNORMAL HIGH (ref 0.1–1.0)
Monocytes Relative: 7 % (ref 3–12)
Neutro Abs: 12.6 10*3/uL — ABNORMAL HIGH (ref 1.7–7.7)

## 2013-02-22 NOTE — ED Provider Notes (Signed)
History     CSN: [id number]  Arrival date & time 02/22/13  1005   None     Chief Complaint  Patient presents with  . Altered Mental Status    (Consider location/radiation/quality/duration/timing/severity/associated sxs/prior treatment) HPI Comments: Patient with history of DM, HTN presents for eval of mental status change.  She was at outpatient surgery for wrist surgery which was done yesterday.    Patient is a 67 y.o. female presenting with altered mental status. The history is provided by the patient.  Altered Mental Status This is a new problem. Episode onset: last night. The problem occurs constantly. The problem has been gradually improving. Pertinent negatives include no chest pain, no headaches and no shortness of breath. Nothing aggravates the symptoms. Nothing relieves the symptoms. She has tried nothing for the symptoms.    Past Medical History  Diagnosis Date  . Multiple falls   . Orthostatic hypotension 05/17/12  . HYPERTENSION     hx. of; no longer on med. due to orthostatic hypotension  . ANXIETY   . Chronic diastolic heart failure   . DIABETES MELLITUS, TYPE II 2001    IDDM  . Carpal tunnel syndrome of right wrist 01/2013  . 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 Diabetic neuropathy     feet  . Hyperlipidemia   . History of substance abuse     Etoh and narcotics  . Diverticulosis   . COPD (chronic obstructive pulmonary disease)     on home O2 at night, 2l/min.; denies SOB with daily activities  . DEPRESSION   . GERD   . SEIZURE DISORDER     no seizures in 3 yrs.  . Wears dentures     upper  . Sleep apnea 05/26/2012    sleep study:  AHI 7.5; RDI 10.7; CPAP not recommended    Past Surgical History  Procedure Laterality Date  . Hemorrhoid surgery    . Wrist fracture surgery Right 02/2012  . Appendectomy  1970's  . Vaginal hysterectomy    . Cataract extraction w/ intraocular lens  implant, bilateral Bilateral 2012  . Cholecystectomy   10/26/2000    lap. chole.  . Shoulder arthroscopy w/ subacromial decompression and distal clavicle excision Left 12/28/2011    Family History  Problem Relation Age of Onset  . Coronary artery disease Father     History  Substance Use Topics  . Smoking status: Current Every Day Smoker -- 30 years    Types: Cigarettes  . Smokeless tobacco: Never Used     Comment: 3 cig./day  . Alcohol Use: No     Comment: former hx. of Etoh abuse    OB History   Grav Para Term Preterm Abortions TAB SAB Ect Mult Living                  Review of Systems  Respiratory: Negative for shortness of breath.   Cardiovascular: Negative for chest pain.  Neurological: Negative for headaches.  Psychiatric/Behavioral: Positive for altered mental status.    Allergies  Penicillins and Tegretol  Home Medications   Current Outpatient Rx  Name  Route  Sig  Dispense  Refill  . aspirin EC 81 MG tablet   Oral   Take 162 mg by mouth 2 (two) times daily.          Marland Kitchen atorvastatin (LIPITOR) 10 MG tablet   Oral   Take 10 mg by mouth daily.         Marland Kitchen  CALCIUM-VITAMIN D PO   Oral   Take 1 tablet by mouth daily with breakfast. Calcium 1200- Vitamin D1000 mg.         . estrogens, conjugated, (PREMARIN) 0.625 MG tablet   Oral   Take 0.625 mg by mouth daily.         Marland Kitchen exenatide (BYETTA) 10 MCG/0.04ML SOLN   Subcutaneous   Inject 10 mcg into the skin 2 (two) times daily with a meal.          . HYDROcodone-acetaminophen (NORCO) 10-325 MG per tablet   Oral   Take 1 tablet by mouth every 4 (four) hours as needed for pain.         Marland Kitchen insulin glargine (LANTUS) 100 UNIT/ML injection   Subcutaneous   Inject 26 Units into the skin at bedtime.          . levETIRAcetam (KEPPRA XR) 500 MG 24 hr tablet   Oral   Take 500 mg by mouth 2 (two) times daily.         . Magnesium 250 MG TABS   Oral   Take 250 mg by mouth daily with breakfast.         . meloxicam (MOBIC) 7.5 MG tablet   Oral   Take  7.5 mg by mouth daily with breakfast.          . metFORMIN (GLUCOPHAGE-XR) 500 MG 24 hr tablet   Oral   Take 1,500 mg by mouth daily at 12 noon.          . Multiple Vitamins-Minerals (CENTRUM SILVER PO)   Oral   Take 1 tablet by mouth daily.         . nicotine (NICODERM CQ - DOSED IN MG/24 HOURS) 21 mg/24hr patch   Transdermal   Place 1 patch onto the skin daily.         Marland Kitchen omeprazole (PRILOSEC) 20 MG capsule   Oral   Take 20 mg by mouth Daily.          Marland Kitchen OVER THE COUNTER MEDICATION   Oral   Take 1 tablet by mouth daily with breakfast. Wal-itin. (Allergy Relief)         . pregabalin (LYRICA) 300 MG capsule   Oral   Take 600 mg by mouth at bedtime.    30 capsule   0   . pyridostigmine (MESTINON) 60 MG tablet   Oral   Take 60 mg by mouth 2 (two) times daily.         . QUEtiapine (SEROQUEL) 300 MG tablet   Oral   Take 300 mg by mouth at bedtime.         Marland Kitchen venlafaxine (EFFEXOR XR) 150 MG 24 hr capsule   Oral   Take 150 mg by mouth daily.          Marland Kitchen albuterol (PROAIR HFA) 108 (90 BASE) MCG/ACT inhaler   Inhalation   Inhale 2 puffs into the lungs every 4 (four) hours as needed for shortness of breath.   1 Inhaler   0   . oxyCODONE-acetaminophen (PERCOCET) 7.5-325 MG per tablet   Oral   Take 1 tablet by mouth every 4 (four) hours as needed.         Marland Kitchen oxyCODONE-acetaminophen (PERCOCET) 7.5-325 MG per tablet   Oral   Take 1 tablet by mouth every 4 (four) hours as needed for pain.   30 tablet   0     BP 120/52  Pulse  63  Temp(Src) 99.7 F (37.6 C) (Oral)  Resp 18  Ht 5' 0.5" (1.537 m)  Wt 179 lb (81.194 kg)  BMI 34.37 kg/m2  SpO2 91%  Physical Exam  ED Course  Procedures (including critical care time)  Labs Reviewed  URINALYSIS, ROUTINE W REFLEX MICROSCOPIC  CBC WITH DIFFERENTIAL  COMPREHENSIVE METABOLIC PANEL   No results found.   No diagnosis found.    MDM  The patient was sent here for continued confusion, drowsiness  after an outpatient procedure.  She is neurologically intact to my exam and appears well.  She is ambulatory and walked to the bathroom without assistance.  The workup, including labs, urine, and ct of the head are unremarkable with the exception of an elevated wbc, the significance of which I am unsure.  I suspect this all may be related to medications given during anesthesia and do not feel as though there is an acute process.  I will discharge her to home.          Geoffery Lyons, MD 02/22/13 1245

## 2013-02-22 NOTE — ED Notes (Signed)
Pt still out of the department at this time 

## 2013-02-22 NOTE — Addendum Note (Signed)
Addendum created 02/22/13 1140 by Jewel Baize Kura Bethards, CRNA   Modules edited: Anesthesia Events

## 2013-02-22 NOTE — ED Notes (Signed)
Pt husband reports pt has had periods of confusion over the last few weeks and pt has been "sleeping more than usual". Prior to sx pt was ambulating with out difficulty and more confused than usual. Husband reports pt was confused and had difficulty walking after sx due to pt being tired and sleepy. Pt reports having burning with urination and frequency for more than a week but has not had a u/a or culture. Pt has hx of uti

## 2013-02-22 NOTE — Addendum Note (Signed)
Addendum created 02/22/13 1200 by Lance Coon, CRNA   Modules edited: Anesthesia Medication Administration, Charges VN

## 2013-02-22 NOTE — Op Note (Signed)
Joyce Richardson, Joyce Richardson             ACCOUNT NO.:  [id number]  MEDICAL RECORD NO.:  [id number]  LOCATION:                                 FACILITY:  PHYSICIAN:  Cindee Salt, M.D.       DATE OF BIRTH:  [birth year]  DATE OF PROCEDURE:  02/21/2013 DATE OF DISCHARGE:                              OPERATIVE REPORT   PREOPERATIVE DIAGNOSIS:  Status post DVR plate with carpal tunnel syndrome penetration into the wrist joint with pegs and ulnocarpal abutment, right wrist.  POSTOPERATIVE DIAGNOSIS:  Status post DVR plate with carpal tunnel syndrome penetration into the wrist joint with pegs and ulnocarpal abutment, right wrist.  OPERATION:  Removal of DVR plate with carpal tunnel release and slide osteotomy, distal ulna, right wrist.  ANESTHESIA:  Supraclavicular block general.  ANESTHESIOLOGIST:  Zenon Mayo, MD  HISTORY:  The patient is a 67 year old female who suffered a fall with a comminuted fracture of her distal right radius.  She underwent open reduction and internal fixation.  She has had continued pain, numbness, and tingling.  Nerve conductions are normal.  CT scan reveals penetration of 1 peg into the joint.  She has an ulnocarpal abutment with ulnar-sided wrist pain, treated as well as ulnar shortening osteotomy, with removal of plate, carpal tunnel release.  Pre, peri, and postoperative course have been discussed along with risks and complications.  She is aware that there is no guarantee with the surgery; possibility of infection; recurrence of injury to arteries, nerves, tendons; incomplete relief of symptoms; dystrophy; nonunion to the osteotomy site requiring further intervention.  In the preoperative area, the patient was seen, the extremity marked by both patient and surgeon.  Antibiotic given.  PROCEDURE IN DETAIL:  The patient was brought to the operating room where a supraclavicular block general anesthetic were carried out without difficulty.  She was  prepped using ChloraPrep, supine position with the right arm free.  A 3-minute dry time was allowed.  Time-out taken, confirming patient and procedure.  The limb was exsanguinated with an Esmarch bandage.  Tourniquet placed on the upper arm was inflated to 250 mmHg.  A longitudinal incision was made in the right palm, carried down through subcutaneous tissue.  Bleeders were electrocauterized.  Palmar fascia was split.  Superficial palmar arch identified.  Flexor tendon of the ring and little finger identified.  To the ulnar side of the median nerve, the carpal retinaculum was incised with sharp dissection.  Right angle and Sewell retractor were placed between skin and forearm fascia.  The fascia released for approximately a 0.5 cm proximal to the wrist crease under direct vision.  Area of compression of the nerve was apparent.  No further lesions were identified.  Motor branch entered into muscle, the wound was irrigated, and the skin was closed with interrupted 4-0 Vicryl deep sutures.  A separate incision was then made on the volar radial aspect of the wrist through the old incision for her volar plate placement.  This was carried down through subcutaneous tissue.  Bleeders were again electrocauterized with bipolar.  The dissection was carried down to the carpi radialis tendon sheath.  The pronator quadratus was identified after elevating  the flexor pollicis longus.  An incision made, the plate was then removed without difficulty including all distal screws, pegs, and the proximal peg.  The wound was copiously irrigated with saline. The pronator repaired after debridement of the plate site with a rongeur.  The closure of the pronator quadratus was done with figure-of- eight 4-0 Vicryl sutures, subcutaneous tissue was closed with interrupted 4-0 Vicryl, and skin with interrupted 4-0 Vicryl Rapide. Separate incision was then made on the dorsal ulnar aspect of her distal ulna, carried down  through subcutaneous tissue.  Bleeders again electrocauterized with bipolar.  Dorsal sensory nerve was identified and protected.  The dissection carried down between the fifth and sixth dorsal compartment.  The distal radioulnar joint was opened.  The cartilage was present on the ulnar head.  An osteotomy was then made, chevron in nature allowing the head of the ulna to be recessed, this was then pinned in position for placement of 2 mini Acutrak screws, these measured 10 and 14 mm.  This firmly fixed the head in position after removal of the wedge osteotomy with the head firmly affixed against the shaft with full pronation and supination available.  The wound was copiously irrigated with saline.  The capsule was then closed with interrupted 4-0 Vicryl, the retinaculum with 4-0 Vicryl, the subcutaneous tissue with interrupted 4-0 Vicryl, and skin with interrupted 4-0 Vicryl Rapide.  Sterile compressive and dorsal palmar Munster splint with the wrist in neutral position was applied.  On deflation of the tourniquet, all fingers immediately pinked.  She was taken to the recovery room for observation in satisfactory condition. It should be noted that 2 mini Acutrak screws, one 9 and one 14 mm were placed in angulatory directions, __________ across the osteotomy site. She will be discharged on Percocet, to return in 1 week.          ______________________________ Cindee Salt, M.D.     GK/MEDQ  D:  02/21/2013  T:  02/22/2013  Job:  [id number]

## 2013-02-22 NOTE — ED Notes (Signed)
Pt BIB [facility] from [facility] for altered mental status after rt RCR and ORIF radial head. Prior to surgery yesterday pt had questionable AMS, O2 sat 86% prior to surgery.

## 2013-02-22 NOTE — ED Notes (Signed)
Pt arrived via [facility] already in gown, placed on continuous pulse oximetry and blood pressure cuff

## 2013-03-01 ENCOUNTER — Telehealth: Payer: Self-pay

## 2013-03-01 NOTE — Telephone Encounter (Signed)
I called patient to advise [facility] meds arrived in the mail and they are ready for pick up.  As well, a new form was sent to [employer] Re: Lyrica dose.  I got no answer, voicemail has not been set up, unable to leave message.

## 2013-03-02 NOTE — Telephone Encounter (Signed)
I called again.  Got no answer.  Unable to leave message.  

## 2013-03-06 NOTE — Telephone Encounter (Signed)
I called again.  Did get VM this time and left message meds were ready for pick up and forms were sent to [facility] for Lyrica.  Asked them to call back if they had any questions.

## 2013-03-08 ENCOUNTER — Telehealth: Payer: Self-pay

## 2013-03-08 MED ORDER — PREGABALIN 300 MG PO CAPS: 600.0000 mg | ORAL_CAPSULE | Freq: Every day | ORAL | Status: DC

## 2013-03-08 NOTE — Telephone Encounter (Signed)
Joyce Richardson form [employer] [facility] said they received the forms we faxed, but they need a new rx sent as well to [phone].

## 2013-03-15 ENCOUNTER — Ambulatory Visit (INDEPENDENT_AMBULATORY_CARE_PROVIDER_SITE_OTHER): Payer: Medicare Other | Admitting: Internal Medicine

## 2013-03-15 ENCOUNTER — Encounter: Payer: Self-pay | Admitting: Internal Medicine

## 2013-03-15 VITALS — BP 140/72 | HR 78 | Temp 98.5°F | Ht 60.5 in | Wt 180.0 lb

## 2013-03-15 DIAGNOSIS — J449 Chronic obstructive pulmonary disease, unspecified: Secondary | ICD-10-CM

## 2013-03-15 DIAGNOSIS — J9610 Chronic respiratory failure, unspecified whether with hypoxia or hypercapnia: Secondary | ICD-10-CM

## 2013-03-15 DIAGNOSIS — F17200 Nicotine dependence, unspecified, uncomplicated: Secondary | ICD-10-CM

## 2013-03-15 NOTE — Assessment & Plan Note (Addendum)
-   PFT's 12/13/2012  FEV1  1.55 (86%) ratio 88 and no change p B2 DLCO 56 corrects to 102  Key is smoking cessation discussed separately as really does not have sign copd

## 2013-03-15 NOTE — Patient Instructions (Addendum)
The key is to stop smoking completely before smoking completely stops you!   02 2lpm at bedtime and when you walk more than 100 yards - if you stop smoking and lose about 10-15 lbs you can probably come completey off 02  There is no reason for regular pulmonary follow up at this point

## 2013-03-15 NOTE — Assessment & Plan Note (Signed)
-   placed on 02 24/7 by Love 06/2012 - 12/13/2012  Walked RA  3 laps @ 185 ft each stopped due to  desat and end of study to 87 but 88% p 3 laps on 2lpm Rx = 02 2lpm sleeping and with exertion  prob more related to weight than copd per se but does probably have some emphysema and clearly bronchitic features from ongoing smoking that contribute to 02 dep and much of this is reversible with behavior modification and not correctable with meds or pulmonary f/u for that matter

## 2013-03-15 NOTE — Progress Notes (Signed)
  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 rec When you walk flat up a hundred yards you don't need oxygen  When you sleep and when you exert you should the 2lpm  Only use your albuterol (ventolin)  as a rescue medication    03/15/2013 f/u ov/Maleki Hippe re copd/02 with exertion but still smoking Chief Complaint  Patient presents with  . Follow-up    Breathing has improved some since last visit. Has good and bad days.    02 helps on her bad days. Needs saba qod at most.   No obvious daytime variabilty or assoc sign sputum production( though does "rattle a bit") or cp or chest tightness, subjective wheeze overt sinus or hb symptoms. No unusual exp hx or h/o childhood pna/ asthma or premature birth to her knowledge.   Reduced smoking when started 23 Jun 2012 but prior to that desats walking mailbox and back and therefore placed on 2lpm 24/7 by Dr Sandria Manly plus saba q am whether needs it or not but doesn't perceive it does anything for her.  On 02 sleeping ok without nocturnal  or early am exacerbation  of respiratory  c/o's or need for noct saba or am ha.  Also denies any obvious fluctuation of symptoms with weather or environmental changes or other aggravating or alleviating factors except as outlined above.   Current Medications, Allergies, Past Medical History, Past Surgical History, Family History, and Social History were reviewed in [employer] record.  ROS  The following are not active complaints unless bolded sore throat, dysphagia, dental problems, itching, sneezing,  nasal congestion or excess/ purulent secretions, ear ache,   fever, chills, sweats, unintended wt loss, pleuritic or exertional cp, hemoptysis,  orthopnea pnd or leg swelling,  presyncope, palpitations, heartburn, abdominal pain, anorexia, nausea, vomiting, diarrhea  or change in bowel or urinary habits, change in stools or urine, dysuria,hematuria,  rash, arthralgias, visual complaints, headache, numbness weakness or ataxia or problems with walking or coordination,  change in mood/affect or memory.            Objective:   Physical Exam  03/15/13       180  Wt Readings from Last 3 Encounters:  12/13/12 186 lb (84.369 kg)  11/20/12 179 lb (81.194 kg)  08/21/12 180 lb 12.8 oz (82.01 kg)   pleasant amb wf with mild rattling cough HEENT mild turbinate edema.  Oropharynx no thrush or excess pnd or cobblestoning.  No JVD or cervical adenopathy. Mild accessory muscle hypertrophy. Trachea midline, nl thryroid. Chest was hyperinflated by percussion with diminished breath sounds and min increased exp time with min exp rhonchi  wheeze. Hoover sign positive at mid inspiration. Regular rate and rhythm without murmur gallop or rub or increase P2 or edema.  Abd: obese no hsm, nl excursion. Ext warm without cyanosis or clubbing.    CXR  12/13/2012 :  Chronic emphysematous and bronchitic lung changes without acute overlying pulmonary process.      Assessment & Plan:

## 2013-03-19 NOTE — Assessment & Plan Note (Signed)
>   3 min  I reviewed the Flethcher curve with patient that basically indicates  if you quit smoking when your best day FEV1 is still well preserved (as hers is) it is highly unlikely you will progress to severe disease and informed the patient there was no medication on the market that has proven to change the curve or the likelihood of progression.  Therefore stopping smoking and maintaining abstinence is the most important aspect of care, not choice of inhalers or for that matter, doctors.

## 2013-04-19 ENCOUNTER — Encounter: Payer: Self-pay | Admitting: Neurology

## 2013-04-30 ENCOUNTER — Telehealth: Payer: Self-pay | Admitting: Neurology

## 2013-04-30 NOTE — Telephone Encounter (Signed)
Rx has been sent.  Called patient.  Spoke with both patient and spouse.  They are aware Rx has been sent.

## 2013-05-18 ENCOUNTER — Emergency Department (HOSPITAL_COMMUNITY): Payer: Medicare Other

## 2013-05-18 ENCOUNTER — Inpatient Hospital Stay (HOSPITAL_COMMUNITY)
Admission: EM | Admit: 2013-05-18 | Discharge: 2013-05-19 | DRG: 081 | Disposition: A | Discharge status: Home / Self Care | Payer: Medicare Other | Attending: Internal Medicine | Admitting: Internal Medicine

## 2013-05-18 DIAGNOSIS — Z9981 Dependence on supplemental oxygen: Secondary | ICD-10-CM

## 2013-05-18 DIAGNOSIS — J4489 Other specified chronic obstructive pulmonary disease: Secondary | ICD-10-CM

## 2013-05-18 DIAGNOSIS — R55 Syncope and collapse: Secondary | ICD-10-CM

## 2013-05-18 DIAGNOSIS — R350 Frequency of micturition: Secondary | ICD-10-CM

## 2013-05-18 DIAGNOSIS — R569 Unspecified convulsions: Secondary | ICD-10-CM

## 2013-05-18 DIAGNOSIS — E1142 Type 2 diabetes mellitus with diabetic polyneuropathy: Secondary | ICD-10-CM | POA: Diagnosis present

## 2013-05-18 DIAGNOSIS — G4730 Sleep apnea, unspecified: Secondary | ICD-10-CM | POA: Diagnosis present

## 2013-05-18 DIAGNOSIS — F17200 Nicotine dependence, unspecified, uncomplicated: Secondary | ICD-10-CM

## 2013-05-18 DIAGNOSIS — F1120 Opioid dependence, uncomplicated: Secondary | ICD-10-CM | POA: Diagnosis present

## 2013-05-18 DIAGNOSIS — R5383 Other fatigue: Secondary | ICD-10-CM

## 2013-05-18 DIAGNOSIS — Z9181 History of falling: Secondary | ICD-10-CM

## 2013-05-18 DIAGNOSIS — R404 Transient alteration of awareness: Principal | ICD-10-CM | POA: Diagnosis present

## 2013-05-18 DIAGNOSIS — F411 Generalized anxiety disorder: Secondary | ICD-10-CM

## 2013-05-18 DIAGNOSIS — R059 Cough, unspecified: Secondary | ICD-10-CM

## 2013-05-18 DIAGNOSIS — J42 Unspecified chronic bronchitis: Secondary | ICD-10-CM

## 2013-05-18 DIAGNOSIS — K5730 Diverticulosis of large intestine without perforation or abscess without bleeding: Secondary | ICD-10-CM

## 2013-05-18 DIAGNOSIS — F329 Major depressive disorder, single episode, unspecified: Secondary | ICD-10-CM

## 2013-05-18 DIAGNOSIS — Z7982 Long term (current) use of aspirin: Secondary | ICD-10-CM

## 2013-05-18 DIAGNOSIS — G8921 Chronic pain due to trauma: Secondary | ICD-10-CM

## 2013-05-18 DIAGNOSIS — D72829 Elevated white blood cell count, unspecified: Secondary | ICD-10-CM

## 2013-05-18 DIAGNOSIS — I10 Essential (primary) hypertension: Secondary | ICD-10-CM

## 2013-05-18 DIAGNOSIS — Z8719 Personal history of other diseases of the digestive system: Secondary | ICD-10-CM

## 2013-05-18 DIAGNOSIS — J309 Allergic rhinitis, unspecified: Secondary | ICD-10-CM

## 2013-05-18 DIAGNOSIS — Z860100 Personal history of colon polyps, unspecified: Secondary | ICD-10-CM

## 2013-05-18 DIAGNOSIS — R0789 Other chest pain: Secondary | ICD-10-CM

## 2013-05-18 DIAGNOSIS — R5381 Other malaise: Secondary | ICD-10-CM

## 2013-05-18 DIAGNOSIS — R4182 Altered mental status, unspecified: Secondary | ICD-10-CM

## 2013-05-18 DIAGNOSIS — Z79899 Other long term (current) drug therapy: Secondary | ICD-10-CM

## 2013-05-18 DIAGNOSIS — R202 Paresthesia of skin: Secondary | ICD-10-CM

## 2013-05-18 DIAGNOSIS — R748 Abnormal levels of other serum enzymes: Secondary | ICD-10-CM

## 2013-05-18 DIAGNOSIS — E119 Type 2 diabetes mellitus without complications: Secondary | ICD-10-CM

## 2013-05-18 DIAGNOSIS — R410 Disorientation, unspecified: Secondary | ICD-10-CM

## 2013-05-18 DIAGNOSIS — G4734 Idiopathic sleep related nonobstructive alveolar hypoventilation: Secondary | ICD-10-CM

## 2013-05-18 DIAGNOSIS — R52 Pain, unspecified: Secondary | ICD-10-CM

## 2013-05-18 DIAGNOSIS — K219 Gastro-esophageal reflux disease without esophagitis: Secondary | ICD-10-CM

## 2013-05-18 DIAGNOSIS — Z794 Long term (current) use of insulin: Secondary | ICD-10-CM

## 2013-05-18 DIAGNOSIS — F1011 Alcohol abuse, in remission: Secondary | ICD-10-CM

## 2013-05-18 DIAGNOSIS — J449 Chronic obstructive pulmonary disease, unspecified: Secondary | ICD-10-CM

## 2013-05-18 DIAGNOSIS — I951 Orthostatic hypotension: Secondary | ICD-10-CM

## 2013-05-18 DIAGNOSIS — I498 Other specified cardiac arrhythmias: Secondary | ICD-10-CM

## 2013-05-18 DIAGNOSIS — J209 Acute bronchitis, unspecified: Secondary | ICD-10-CM

## 2013-05-18 DIAGNOSIS — N390 Urinary tract infection, site not specified: Secondary | ICD-10-CM

## 2013-05-18 DIAGNOSIS — F3289 Other specified depressive episodes: Secondary | ICD-10-CM

## 2013-05-18 DIAGNOSIS — M79601 Pain in right arm: Secondary | ICD-10-CM

## 2013-05-18 DIAGNOSIS — L538 Other specified erythematous conditions: Secondary | ICD-10-CM | POA: Diagnosis present

## 2013-05-18 DIAGNOSIS — E1149 Type 2 diabetes mellitus with other diabetic neurological complication: Secondary | ICD-10-CM | POA: Diagnosis present

## 2013-05-18 DIAGNOSIS — F1210 Cannabis abuse, uncomplicated: Secondary | ICD-10-CM | POA: Diagnosis present

## 2013-05-18 DIAGNOSIS — F1911 Other psychoactive substance abuse, in remission: Secondary | ICD-10-CM

## 2013-05-18 DIAGNOSIS — R635 Abnormal weight gain: Secondary | ICD-10-CM

## 2013-05-18 DIAGNOSIS — Z9283 Personal history of failed moderate sedation: Secondary | ICD-10-CM

## 2013-05-18 DIAGNOSIS — G40909 Epilepsy, unspecified, not intractable, without status epilepticus: Secondary | ICD-10-CM

## 2013-05-18 DIAGNOSIS — IMO0002 Reserved for concepts with insufficient information to code with codable children: Secondary | ICD-10-CM

## 2013-05-18 DIAGNOSIS — R05 Cough: Secondary | ICD-10-CM

## 2013-05-18 DIAGNOSIS — E1165 Type 2 diabetes mellitus with hyperglycemia: Secondary | ICD-10-CM | POA: Diagnosis present

## 2013-05-18 DIAGNOSIS — R030 Elevated blood-pressure reading, without diagnosis of hypertension: Secondary | ICD-10-CM

## 2013-05-18 DIAGNOSIS — F29 Unspecified psychosis not due to a substance or known physiological condition: Secondary | ICD-10-CM | POA: Diagnosis present

## 2013-05-18 DIAGNOSIS — I5032 Chronic diastolic (congestive) heart failure: Secondary | ICD-10-CM

## 2013-05-18 DIAGNOSIS — E785 Hyperlipidemia, unspecified: Secondary | ICD-10-CM

## 2013-05-18 DIAGNOSIS — K589 Irritable bowel syndrome without diarrhea: Secondary | ICD-10-CM

## 2013-05-18 DIAGNOSIS — F1910 Other psychoactive substance abuse, uncomplicated: Secondary | ICD-10-CM

## 2013-05-18 DIAGNOSIS — Z9189 Other specified personal risk factors, not elsewhere classified: Secondary | ICD-10-CM

## 2013-05-18 DIAGNOSIS — R252 Cramp and spasm: Secondary | ICD-10-CM

## 2013-05-18 DIAGNOSIS — J9610 Chronic respiratory failure, unspecified whether with hypoxia or hypercapnia: Secondary | ICD-10-CM

## 2013-05-18 DIAGNOSIS — E669 Obesity, unspecified: Secondary | ICD-10-CM

## 2013-05-18 LAB — POCT I-STAT 3, ART BLOOD GAS (G3+)
Acid-base deficit: 1 mmol/L (ref 0.0–2.0)
Bicarbonate: 19.2 mEq/L — ABNORMAL LOW (ref 20.0–24.0)
O2 Saturation: 98 %
TCO2: 20 mmol/L (ref 0–100)
pO2, Arterial: 95 mmHg (ref 80.0–100.0)

## 2013-05-18 LAB — CBC WITH DIFFERENTIAL/PLATELET
Lymphocytes Relative: 9 % — ABNORMAL LOW (ref 12–46)
Lymphs Abs: 1.9 10*3/uL (ref 0.7–4.0)
Neutrophils Relative %: 86 % — ABNORMAL HIGH (ref 43–77)
Platelets: 436 10*3/uL — ABNORMAL HIGH (ref 150–400)
RBC: 4.96 MIL/uL (ref 3.87–5.11)
WBC: 21.5 10*3/uL — ABNORMAL HIGH (ref 4.0–10.5)

## 2013-05-18 LAB — COMPREHENSIVE METABOLIC PANEL
ALT: 15 U/L (ref 0–35)
Alkaline Phosphatase: 113 U/L (ref 39–117)
CO2: 20 mEq/L (ref 19–32)
GFR calc Af Amer: >90 mL/min (ref >90)
GFR calc non Af Amer: >90 mL/min (ref >90)
Glucose, Bld: 257 mg/dL — ABNORMAL HIGH (ref 70–99)
Potassium: 3.4 mEq/L — ABNORMAL LOW (ref 3.5–5.1)
Sodium: 138 mEq/L (ref 135–145)

## 2013-05-18 LAB — PRO B NATRIURETIC PEPTIDE: Pro B Natriuretic peptide (BNP): 583.7 pg/mL — ABNORMAL HIGH (ref 0–125)

## 2013-05-18 LAB — URINE MICROSCOPIC-ADD ON

## 2013-05-18 LAB — CBC
HCT: 38.3 % (ref 36.0–46.0)
Hemoglobin: 12.7 g/dL (ref 12.0–15.0)
MCV: 81 fL (ref 78.0–100.0)
RBC: 4.73 MIL/uL (ref 3.87–5.11)
RDW: 14.2 % (ref 11.5–15.5)
WBC: 23.7 10*3/uL — ABNORMAL HIGH (ref 4.0–10.5)

## 2013-05-18 LAB — URINALYSIS, ROUTINE W REFLEX MICROSCOPIC
Glucose, UA: NEGATIVE mg/dL
Ketones, ur: >80 mg/dL — AB
Leukocytes, UA: NEGATIVE
pH: 6 (ref 5.0–8.0)

## 2013-05-18 LAB — RAPID URINE DRUG SCREEN, HOSP PERFORMED
Amphetamines: NOT DETECTED
Benzodiazepines: NOT DETECTED

## 2013-05-18 LAB — CREATININE, SERUM
GFR calc Af Amer: >90 mL/min (ref >90)
GFR calc non Af Amer: >90 mL/min (ref >90)

## 2013-05-18 LAB — POCT I-STAT, CHEM 8
BUN: 16 mg/dL (ref 6–23)
Calcium, Ion: 1.03 mmol/L — ABNORMAL LOW (ref 1.13–1.30)
Chloride: 107 mEq/L (ref 96–112)
Creatinine, Ser: 0.6 mg/dL (ref 0.50–1.10)

## 2013-05-18 LAB — GLUCOSE, CAPILLARY

## 2013-05-18 LAB — CG4 I-STAT (LACTIC ACID): Lactic Acid, Venous: 1.98 mmol/L (ref 0.5–2.2)

## 2013-05-18 LAB — ETHANOL: Alcohol, Ethyl (B): <11 mg/dL (ref 0–11)

## 2013-05-18 MED ORDER — INSULIN GLARGINE 100 UNIT/ML ~~LOC~~ SOLN
26.0000 [IU] | Freq: Every day | SUBCUTANEOUS | Status: DC
  Administered 2013-05-18: 26 [IU] via SUBCUTANEOUS
  Filled 2013-05-18 (×2): qty 0.26

## 2013-05-18 MED ORDER — IOHEXOL 300 MG/ML  SOLN
100.0000 mL | Freq: Once | INTRAMUSCULAR | Status: AC | PRN
  Administered 2013-05-18: 100 mL via INTRAVENOUS

## 2013-05-18 MED ORDER — ASPIRIN EC 81 MG PO TBEC
81.0000 mg | DELAYED_RELEASE_TABLET | Freq: Every day | ORAL | Status: DC
  Administered 2013-05-18 – 2013-05-19 (×2): 81 mg via ORAL
  Filled 2013-05-18 (×2): qty 1

## 2013-05-18 MED ORDER — SENNOSIDES-DOCUSATE SODIUM 8.6-50 MG PO TABS: 1.0000 | ORAL_TABLET | Freq: Every evening | ORAL | Status: DC | PRN

## 2013-05-18 MED ORDER — SODIUM CHLORIDE 0.9 % IV SOLN
INTRAVENOUS | Status: DC
  Administered 2013-05-18: 18:00:00 via INTRAVENOUS

## 2013-05-18 MED ORDER — LORAZEPAM 2 MG/ML IJ SOLN: 1.0000 mg | INTRAMUSCULAR | Status: DC | PRN

## 2013-05-18 MED ORDER — ACETAMINOPHEN 325 MG PO TABS: 650.0000 mg | ORAL_TABLET | Freq: Four times a day (QID) | ORAL | Status: DC | PRN

## 2013-05-18 MED ORDER — LEVETIRACETAM ER 500 MG PO TB24
500.0000 mg | ORAL_TABLET | Freq: Two times a day (BID) | ORAL | Status: DC
  Administered 2013-05-18 – 2013-05-19 (×2): 500 mg via ORAL
  Filled 2013-05-18 (×3): qty 1

## 2013-05-18 MED ORDER — LEVETIRACETAM ER 500 MG PO TB24
500.0000 mg | ORAL_TABLET | Freq: Two times a day (BID) | ORAL | Status: DC
  Filled 2013-05-18: qty 1

## 2013-05-18 MED ORDER — LIRAGLUTIDE 18 MG/3ML ~~LOC~~ SOPN: 0.6000 mg | PEN_INJECTOR | Freq: Every day | SUBCUTANEOUS | Status: DC

## 2013-05-18 MED ORDER — PREGABALIN 75 MG PO CAPS: 600.0000 mg | ORAL_CAPSULE | Freq: Every day | ORAL | Status: DC

## 2013-05-18 MED ORDER — ENOXAPARIN SODIUM 40 MG/0.4ML ~~LOC~~ SOLN
40.0000 mg | Freq: Every day | SUBCUTANEOUS | Status: DC
  Administered 2013-05-18: 40 mg via SUBCUTANEOUS
  Filled 2013-05-18 (×2): qty 0.4

## 2013-05-18 MED ORDER — ONDANSETRON HCL 4 MG PO TABS
4.0000 mg | ORAL_TABLET | Freq: Four times a day (QID) | ORAL | Status: DC | PRN
  Administered 2013-05-19: 4 mg via ORAL

## 2013-05-18 MED ORDER — LORAZEPAM 1 MG PO TABS
1.0000 mg | ORAL_TABLET | ORAL | Status: DC | PRN
  Administered 2013-05-18: 1 mg via ORAL
  Filled 2013-05-18: qty 1

## 2013-05-18 MED ORDER — QUETIAPINE FUMARATE 100 MG PO TABS
300.0000 mg | ORAL_TABLET | Freq: Every day | ORAL | Status: DC
  Filled 2013-05-18: qty 1

## 2013-05-18 MED ORDER — INSULIN ASPART 100 UNIT/ML ~~LOC~~ SOLN
0.0000 [IU] | Freq: Three times a day (TID) | SUBCUTANEOUS | Status: DC
  Administered 2013-05-19 (×2): 3 [IU] via SUBCUTANEOUS

## 2013-05-18 MED ORDER — HYDROCODONE-ACETAMINOPHEN 5-325 MG PO TABS: 1.0000 | ORAL_TABLET | Freq: Three times a day (TID) | ORAL | Status: DC | PRN

## 2013-05-18 MED ORDER — ONDANSETRON HCL 4 MG/2ML IJ SOLN: 4.0000 mg | Freq: Four times a day (QID) | INTRAMUSCULAR | Status: DC | PRN

## 2013-05-18 MED ORDER — LORAZEPAM 2 MG/ML IJ SOLN
INTRAMUSCULAR | Status: AC
  Administered 2013-05-18: 1 mg via INTRAVENOUS
  Filled 2013-05-18: qty 1

## 2013-05-18 MED ORDER — PREGABALIN 75 MG PO CAPS
600.0000 mg | ORAL_CAPSULE | Freq: Every day | ORAL | Status: DC
  Administered 2013-05-18: 600 mg via ORAL
  Filled 2013-05-18: qty 4
  Filled 2013-05-18: qty 1

## 2013-05-18 MED ORDER — PYRIDOSTIGMINE BROMIDE 60 MG PO TABS
60.0000 mg | ORAL_TABLET | Freq: Two times a day (BID) | ORAL | Status: DC
  Administered 2013-05-18 – 2013-05-19 (×2): 60 mg via ORAL
  Filled 2013-05-18 (×3): qty 1

## 2013-05-18 MED ORDER — ATORVASTATIN CALCIUM 10 MG PO TABS
10.0000 mg | ORAL_TABLET | Freq: Every day | ORAL | Status: DC
  Administered 2013-05-18: 10 mg via ORAL
  Filled 2013-05-18 (×2): qty 1

## 2013-05-18 MED ORDER — ALBUTEROL SULFATE HFA 108 (90 BASE) MCG/ACT IN AERS: 2.0000 | INHALATION_SPRAY | RESPIRATORY_TRACT | Status: DC | PRN

## 2013-05-18 MED ORDER — VENLAFAXINE HCL ER 150 MG PO CP24
150.0000 mg | ORAL_CAPSULE | Freq: Every day | ORAL | Status: DC
  Administered 2013-05-18 – 2013-05-19 (×2): 150 mg via ORAL
  Filled 2013-05-18 (×2): qty 1

## 2013-05-18 MED ORDER — INSULIN ASPART 100 UNIT/ML ~~LOC~~ SOLN: 0.0000 [IU] | Freq: Every day | SUBCUTANEOUS | Status: DC

## 2013-05-18 MED ORDER — METFORMIN HCL ER 500 MG PO TB24
1500.0000 mg | ORAL_TABLET | Freq: Every day | ORAL | Status: DC
  Administered 2013-05-19: 1500 mg via ORAL
  Filled 2013-05-18: qty 3
  Filled 2013-05-18: qty 2

## 2013-05-18 MED ORDER — ONDANSETRON HCL 4 MG/2ML IJ SOLN: 4.0000 mg | Freq: Three times a day (TID) | INTRAMUSCULAR | Status: DC | PRN

## 2013-05-18 MED ORDER — LORAZEPAM 2 MG/ML IJ SOLN: 1.0000 mg | Freq: Once | INTRAMUSCULAR | Status: AC

## 2013-05-18 MED ORDER — SODIUM CHLORIDE 0.9 % IJ SOLN
3.0000 mL | Freq: Two times a day (BID) | INTRAMUSCULAR | Status: DC
  Administered 2013-05-18: 3 mL via INTRAVENOUS

## 2013-05-18 MED ORDER — PANTOPRAZOLE SODIUM 40 MG PO TBEC
40.0000 mg | DELAYED_RELEASE_TABLET | Freq: Every day | ORAL | Status: DC
  Administered 2013-05-18 – 2013-05-19 (×2): 40 mg via ORAL
  Filled 2013-05-18 (×3): qty 1

## 2013-05-18 MED ORDER — ACETAMINOPHEN 650 MG RE SUPP: 650.0000 mg | Freq: Four times a day (QID) | RECTAL | Status: DC | PRN

## 2013-05-18 MED ORDER — QUETIAPINE FUMARATE 300 MG PO TABS
300.0000 mg | ORAL_TABLET | Freq: Every day | ORAL | Status: DC
  Administered 2013-05-18: 300 mg via ORAL
  Filled 2013-05-18 (×2): qty 1

## 2013-05-18 NOTE — H&P (Signed)
[facility] History and Physical  Joyce Richardson JXB:[id number] DOB: September 19, 1946 DOA: 05/18/2013  Referring physician: Manus Gunning PCP: Astrid Divine, MD  Specialists: neuro  Chief Complaint: Confusion  HPI: Joyce Richardson is a 67 y.o. female Brought to ED for confusion for 2 days stopped taking medication. Unable to provide any history. UDS shows opiates and THC. Upon questioning husband reports patient crushes her percocet and snorts them. Taking more than prescribed. [name] out with friend who provided marijuana. No known alcohol use.  H/o polysubstance abuse and 30 day drug and alcohol rehab. H/o seizures. No witnessed seizure for several years.  Recently started on ditripan. Takes pyridostigmine for orthostatic hypotension. H/o peripheral neuropathy, depression.  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  All history per husband, review of outpatient records  Review of Systems: unable  Past Medical History  Diagnosis Date  . Multiple falls   . Orthostatic hypotension 05/17/12  . HYPERTENSION     hx. of; no longer on med. due to orthostatic hypotension  . ANXIETY   . Chronic diastolic heart failure   . DIABETES MELLITUS, TYPE II 2001    IDDM  . Carpal tunnel syndrome of right wrist 01/2013  . 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 Diabetic neuropathy     feet  . Hyperlipidemia   . History of substance abuse     Etoh and narcotics  . Diverticulosis   . COPD (chronic obstructive pulmonary disease)     on home O2 at night, 2l/min.; denies SOB with daily activities  . DEPRESSION   . GERD   . SEIZURE DISORDER     no seizures in 3 yrs.  . Wears dentures     upper  . Sleep apnea 05/26/2012    sleep study:  AHI 7.5; RDI 10.7; CPAP not recommended   Past Surgical History  Procedure Laterality Date  . Hemorrhoid surgery    . Wrist fracture surgery Right 02/2012  . Appendectomy  1970's  . Vaginal hysterectomy    . Cataract extraction w/  intraocular lens  implant, bilateral Bilateral 2012  . Cholecystectomy  10/26/2000    lap. chole.  . Shoulder arthroscopy w/ subacromial decompression and distal clavicle excision Left 12/28/2011  . Hardware removal Right 02/21/2013    Procedure: REMOVAL DVR PLATE;  Surgeon: Nicki Reaper, MD;  Location: [facility];  Service: Orthopedics;  Laterality: Right;  . Carpal tunnel release Right 02/21/2013    Procedure: CARPAL TUNNEL RELEASE;  Surgeon: Nicki Reaper, MD;  Location: [facility];  Service: Orthopedics;  Laterality: Right;  . Wrist osteotomy Right 02/21/2013    Procedure: OSTEOTOMY ULNAR HEAD;  Surgeon: Nicki Reaper, MD;  Location: [facility];  Service: Orthopedics;  Laterality: Right;   Social History: unable. See above   Allergies  Allergen Reactions  . Penicillins Swelling    OF LIPS  . Tegretol (Carbamazepine) Other (See Comments)    HYPONATREMIA    Family History  Problem Relation Age of Onset  . Coronary artery disease Father     Prior to Admission medications   Medication Sig Start Date End Date Taking? Authorizing Provider  albuterol (PROAIR HFA) 108 (90 BASE) MCG/ACT inhaler Inhale 2 puffs into the lungs every 4 (four) hours as needed for shortness of breath. 07/12/12 07/12/13 Yes Madelin Headings, MD  aspirin EC 81 MG tablet Take 81 mg by mouth daily.   Yes Historical Provider,  MD  atorvastatin (LIPITOR) 10 MG tablet Take 10 mg by mouth daily. 10/26/11  Yes Madelin Headings, MD  CALCIUM-VITAMIN D PO Take 1 tablet by mouth daily with breakfast. Calcium 1200- Vitamin D1000 mg.   Yes Historical Provider, MD  estrogens, conjugated, (PREMARIN) 0.625 MG tablet Take 0.625 mg by mouth daily.   Yes Madelin Headings, MD  HYDROcodone-acetaminophen (NORCO) 10-325 MG per tablet Take 1 tablet by mouth every 4 (four) hours as needed for pain.   Yes Historical Provider, MD  insulin glargine (LANTUS) 100 UNIT/ML injection Inject 26 Units into the skin at  bedtime.  05/17/12  Yes Standley Brooking, MD  levETIRAcetam (KEPPRA XR) 500 MG 24 hr tablet Take 500 mg by mouth 2 (two) times daily.   Yes Historical Provider, MD  Liraglutide (VICTOZA) 18 MG/3ML SOPN Inject 0.6 mg into the skin daily.   Yes Historical Provider, MD  Magnesium 250 MG TABS Take 250 mg by mouth daily with breakfast.   Yes Historical Provider, MD  metFORMIN (GLUCOPHAGE-XR) 500 MG 24 hr tablet Take 1,500 mg by mouth daily at 12 noon.    Yes Historical Provider, MD  Multiple Vitamins-Minerals (CENTRUM SILVER PO) Take 1 tablet by mouth daily.   Yes Historical Provider, MD  omeprazole (PRILOSEC) 20 MG capsule Take 20 mg by mouth Daily.  09/22/11  Yes Historical Provider, MD  OVER THE COUNTER MEDICATION Take 1 tablet by mouth as needed (allergies). Wal-itin. (Allergy Relief)   Yes Historical Provider, MD  oxybutynin (DITROPAN) 5 MG tablet Take 5 mg by mouth every morning.   Yes Historical Provider, MD  pregabalin (LYRICA) 300 MG capsule Take 2 capsules (600 mg total) by mouth at bedtime. 03/08/13  Yes Huston Foley, MD  pyridostigmine (MESTINON) 60 MG tablet Take 60 mg by mouth 2 (two) times daily.   Yes Historical Provider, MD  QUEtiapine (SEROQUEL) 300 MG tablet Take 300 mg by mouth at bedtime.   Yes Historical Provider, MD  venlafaxine (EFFEXOR XR) 150 MG 24 hr capsule Take 150 mg by mouth daily.    Yes Historical Provider, MD   Physical Exam: Filed Vitals:   05/18/13 1200 05/18/13 1524 05/18/13 1714 05/18/13 2018  BP: 101/54 125/48 123/56 91/53  Pulse: 71 84 79 82  Temp: 97.6 F (36.4 C) 98.5 F (36.9 C) 98.2 F (36.8 C) 98.6 F (37 C)  TempSrc: Rectal Oral Oral Oral  Resp: 21 18 16 18   SpO2: 99% 98% 99% 93%  BP 91/53  Pulse 82  Temp(Src) 98.6 F (37 C) (Oral)  Resp 18  SpO2 93%  General Appearance:    Has come out of her bed with both handrails up, sitting in chair sorting through a drawer of medical supplies. Confused, fidgety, redirectable.  Head:    Normocephalic,  without obvious abnormality, atraumatic  Eyes:    Right pupil larger than left with lens implant, conjunctiva/corneas clear, EOM's intact,      Nose:   Nares normal, septum midline, mucosa normal, no drainage    or sinus tenderness  Throat:   Lips, mucosa, and tongue normal; teeth and gums normal  Neck:   Supple, symmetrical, trachea midline, no adenopathy;    thyroid:  no enlargement/tenderness/nodules; no carotid   bruit or JVD  Back:     Symmetric, no curvature, ROM normal, no CVA tenderness  Lungs:     Clear to auscultation bilaterally, respirations unlabored  Chest Wall:    No tenderness or deformity   Heart:  Regular rate and rhythm, S1 and S2 normal, no murmur, rub   or gallop     Abdomen:     Soft, non-tender, bowel sounds active all four quadrants,    no masses, no organomegaly  Genitalia:   deferred  Rectal:   deferred  Extremities:   Extremities normal, atraumatic, no cyanosis or edema  Pulses:   2+ and symmetric all extremities  Skin:   Skin color, texture, turgor normal, no rashes or lesions  Lymph nodes:   Cervical, supraclavicular, and axillary nodes normal  Neurologic:   CNII-XII intact, gait normal. No obvious defecits. Unable to cooperate    Psychiatric: distracted. Laughs and does not answer most questions. Sometimes says, "I don't know".   Labs on Admission:  Basic Metabolic Panel:  Recent Labs Lab 05/18/13 1240 05/18/13 1424 05/18/13 1738  NA 138 139  --   K 3.4* 3.5  --   CL 99 107  --   CO2 20  --   --   GLUCOSE 257* 222*  --   BUN 16 16  --   CREATININE 0.53 0.60 0.58  CALCIUM 9.2  --   --    Liver Function Tests:  Recent Labs Lab 05/18/13 1240  AST 28  ALT 15  ALKPHOS 113  BILITOT 0.6  PROT 7.5  ALBUMIN 3.5   No results found for this basename: LIPASE, AMYLASE,  in the last 168 hours  Recent Labs Lab 05/18/13 1738  AMMONIA 13   CBC:  Recent Labs Lab 05/18/13 1240 05/18/13 1424 05/18/13 1738  WBC 21.5*  --  23.7*   NEUTROABS 18.4*  --   --   HGB 13.5 14.3 12.7  HCT 39.5 42.0 38.3  MCV 79.6  --  81.0  PLT 436*  --  405*   Cardiac Enzymes:  Recent Labs Lab 05/18/13 1240  TROPONINI <0.30    BNP (last 3 results)  Recent Labs  05/18/13 1240  PROBNP 583.7*   CBG:  Recent Labs Lab 05/18/13 1238 05/18/13 1951  GLUCAP 255* 172*    Radiological Exams on Admission: Ct Head Wo Contrast  05/18/2013   *RADIOLOGY REPORT*  Clinical Data: Transient ischemic attack. Expressive aphasia.  CT HEAD WITHOUT CONTRAST  Technique:  Contiguous axial images were obtained from the base of the skull through the vertex without contrast.  Comparison: CT scan dated 02/22/2013  Findings: There is no acute intracranial hemorrhage, infarction, or mass lesion.  Mild diffuse cerebral cortical atrophy.  Partial opacification of the left maxillary sinus which is new since the prior exam.  No acute osseous abnormality.  IMPRESSION: Atrophy.  No acute intracranial abnormality.   Original Report Authenticated By: Francene Boyers, M.D.   Ct Abdomen Pelvis W Contrast  05/18/2013   *RADIOLOGY REPORT*  Clinical Data: Acute epigastric abdominal pain.  Acute mental status changes with expressive aphasia.  Prior history of diverticulitis.  CT ABDOMEN AND PELVIS WITH CONTRAST  Technique:  Multidetector CT imaging of the abdomen and pelvis was performed following the standard protocol during bolus administration of intravenous contrast.  Contrast: OMNIPAQUE IOHEXOL 300 MG/ML IV. Oral contrast was not administered.  Comparison: C t enterography 12/23/2008.  CT abdomen and pelvis 07/26/2008.  Findings: Respiratory motion blurred several of the images of the mid abdomen and patient motion blurred images of the pelvis.  Hepatic cysts, the largest approximating 2 cm in the central portion of the medial segment left lobe; no significant abnormality involving the liver.  Normal appearing spleen,  adrenal glands, and kidneys.  Gallbladder  surgically absent.  No biliary ductal dilation.  Mild pancreatic atrophy without focal pancreatic parenchymal abnormality.  Severe aorto-iliofemoral atherosclerosis without aneurysm.  Patent visceral arteries.  No significant lymphadenopathy.  Small hiatal hernia.  Stomach otherwise unremarkable.  Normal- appearing small bowel.  Mobile cecum positioned in the right upper quadrant the abdomen.  Majority the colon decompressed.  Extensive descending and sigmoid colon diverticulosis without evidence of acute diverticulitis.  No ascites.  Appendix not visualized, but no pericecal inflammation.  Urinary bladder decompressed and unremarkable.  Numerous pelvic phleboliths.  Uterus surgically absent.  No adnexal masses or free pelvic fluid.  Bone window images demonstrate generalized osseous demineralization and a stable bone island in the first sacral segment.  Mild dependent atelectasis in the visualized lung bases.  Heart size normal and stable.  IMPRESSION:  1.  No acute abnormalities involving the abdomen or pelvis. 2.  Extensive descending and sigmoid colon diverticulosis without evidence of acute diverticulitis. 3.  Small hiatal hernia.   Original Report Authenticated By: Hulan Saas, M.D.   Dg Abd Acute W/chest  05/18/2013   *RADIOLOGY REPORT*  Clinical Data: Pelvic pain.  ACUTE ABDOMEN SERIES (ABDOMEN 2 VIEW & CHEST 1 VIEW)  Comparison: Chest x-ray dated 12/13/2012 and abdominal radiographs dated 12/17/2008  Findings: Heart size and vascularity are normal.  Interstitial markings are slightly accentuated but this probably due to a shallow inspiration.  There is no free air or free fluid.  There is slightly distended loops of small bowel in the mid abdomen, nonspecific.  Bilateral duplex ureters are noted. No acute osseous abnormality.  IMPRESSION: Slightly distended small bowel loops in the left mid abdomen. Otherwise, benign-appearing abdomen and chest.   Original Report Authenticated By: Francene Boyers, M.D.     EKG: NSR. Wandering baaseline  Assessment/Plan Principal Problem:   Acute delirium: likely secondary to illicit drug use, medication effect/potential medication withdrawal.  Admit. Resume psychiatric meds and AED. Neuro has consulted and ordered EEG. Taper off opiates.  Social work consult once lucid. inpatient Active Problems:   Polysubstance abuse: opiates, THC, possibly others   SEIZURE DISORDER   DM (diabetes mellitus), type 2, uncontrolled: resume meds. Check hgb a1c   HYPERLIPIDEMIA   DEPRESSION   Chronic diastolic heart failure, compensated   Obesity, unspecified   Orthostatic hypotension   Code Status: full Family Communication: husband Disposition Plan: home  Time spent: 90 minutes  Kripa Foskey L [facility] Pager [phone]  If 7PM-7AM, please contact night-coverage [email or web address] Password [facility] 05/18/2013, 9:36 PM

## 2013-05-18 NOTE — ED Notes (Signed)
Patient To ED with C/O expressive asphasia. On entering the room RN asked the patient how she was doing and she clearly answered " I and not doing well". After that RN could not get patient to respond to any further questions. RN notes that patient is moaning and breathing hard. States that she has pain in her abdomen, chest, head and back.

## 2013-05-18 NOTE — Consult Note (Signed)
Reason for Consult: altered mental status Referring Physician: Dr Manus Gunning   CC: husband reports patient stopped speaking and has been confused.  HPI: Joyce Richardson is a 67 y.o. female followed in the past by Dr. Avie Echevaria for a history of generalized seizures. The patient also has a history of psychiatric disorders. She has a previous history of drug and alcohol abuse; although, she participated in the rehabilitation program and is believed to be sober. She has a history of falls and a previous history of difficulty with speech and completing words as well as previous confusion. This history is from a [facility] office visit from August of 2013.  The patient is unable to give any history today. She occasionally speaks although her comments and answers are not appropriate. The patient's husband reports that the patient stopped speaking 2-3 days ago. Although he does admit that she will at times speak single phrases. She has been confused. She has refused to take any of her medications. They presented to the [facility] emergency department today where a CT scan showed no acute abnormalities. The patient remains confused the etiology of which is unclear at this time although she has had confusion in the past.  Past Medical History  Diagnosis Date  . Multiple falls   . Orthostatic hypotension 05/17/12  . HYPERTENSION     hx. of; no longer on med. due to orthostatic hypotension  . ANXIETY   . Chronic diastolic heart failure   . DIABETES MELLITUS, TYPE II 2001    IDDM  . Carpal tunnel syndrome of right wrist 01/2013  . 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 Diabetic neuropathy     feet  . Hyperlipidemia   . History of substance abuse     Etoh and narcotics  . Diverticulosis   . COPD (chronic obstructive pulmonary disease)     on home O2 at night, 2l/min.; denies SOB with daily activities  . DEPRESSION   . GERD   . SEIZURE DISORDER     no seizures in  3 yrs.  . Wears dentures     upper  . Sleep apnea 05/26/2012    sleep study:  AHI 7.5; RDI 10.7; CPAP not recommended    Past Surgical History  Procedure Laterality Date  . Hemorrhoid surgery    . Wrist fracture surgery Right 02/2012  . Appendectomy  1970's  . Vaginal hysterectomy    . Cataract extraction w/ intraocular lens  implant, bilateral Bilateral 2012  . Cholecystectomy  10/26/2000    lap. chole.  . Shoulder arthroscopy w/ subacromial decompression and distal clavicle excision Left 12/28/2011  . Hardware removal Right 02/21/2013    Procedure: REMOVAL DVR PLATE;  Surgeon: Nicki Reaper, MD;  Location: [facility];  Service: Orthopedics;  Laterality: Right;  . Carpal tunnel release Right 02/21/2013    Procedure: CARPAL TUNNEL RELEASE;  Surgeon: Nicki Reaper, MD;  Location: [facility];  Service: Orthopedics;  Laterality: Right;  . Wrist osteotomy Right 02/21/2013    Procedure: OSTEOTOMY ULNAR HEAD;  Surgeon: Nicki Reaper, MD;  Location: [facility];  Service: Orthopedics;  Laterality: Right;    Family History  Problem Relation Age of Onset  . Coronary artery disease Father     Social History:  reports that she has been smoking Cigarettes.  She has been smoking about 1 packs per day for the past 30 years. She continues to smoke.  She has never used smokeless tobacco. She reports that she does not drink alcohol or use illicit drugs.  Allergies  Allergen Reactions  . Penicillins Swelling    OF LIPS  . Tegretol (Carbamazepine) Other (See Comments)    HYPONATREMIA    Medications:Current facility-administered medications:0.9 %  sodium chloride infusion, , Intravenous, STAT, Glynn Octave, MD;  ondansetron (ZOFRAN) injection 4 mg, 4 mg, Intravenous, Q8H PRN, Glynn Octave, MD Current outpatient prescriptions:albuterol (PROAIR HFA) 108 (90 BASE) MCG/ACT inhaler, Inhale 2 puffs into the lungs every 4 (four) hours as needed for shortness of  breath., Disp: 1 Inhaler, Rfl: 0;  aspirin EC 81 MG tablet, Take 81 mg by mouth daily., Disp: , Rfl: ;  atorvastatin (LIPITOR) 10 MG tablet, Take 10 mg by mouth daily., Disp: , Rfl:  CALCIUM-VITAMIN D PO, Take 1 tablet by mouth daily with breakfast. Calcium 1200- Vitamin D1000 mg., Disp: , Rfl: ;  estrogens, conjugated, (PREMARIN) 0.625 MG tablet, Take 0.625 mg by mouth daily., Disp: , Rfl: ;  HYDROcodone-acetaminophen (NORCO) 10-325 MG per tablet, Take 1 tablet by mouth every 4 (four) hours as needed for pain., Disp: , Rfl:  insulin glargine (LANTUS) 100 UNIT/ML injection, Inject 26 Units into the skin at bedtime. , Disp: , Rfl: ;  levETIRAcetam (KEPPRA XR) 500 MG 24 hr tablet, Take 500 mg by mouth 2 (two) times daily., Disp: , Rfl: ;  Liraglutide (VICTOZA) 18 MG/3ML SOPN, Inject 0.6 mg into the skin daily., Disp: , Rfl: ;  Magnesium 250 MG TABS, Take 250 mg by mouth daily with breakfast., Disp: , Rfl:  metFORMIN (GLUCOPHAGE-XR) 500 MG 24 hr tablet, Take 1,500 mg by mouth daily at 12 noon. , Disp: , Rfl: ;  Multiple Vitamins-Minerals (CENTRUM SILVER PO), Take 1 tablet by mouth daily., Disp: , Rfl: ;  omeprazole (PRILOSEC) 20 MG capsule, Take 20 mg by mouth Daily. , Disp: , Rfl: ;  OVER THE COUNTER MEDICATION, Take 1 tablet by mouth as needed (allergies). Wal-itin. (Allergy Relief), Disp: , Rfl:  oxybutynin (DITROPAN) 5 MG tablet, Take 5 mg by mouth every morning., Disp: , Rfl: ;  pregabalin (LYRICA) 300 MG capsule, Take 2 capsules (600 mg total) by mouth at bedtime., Disp: 180 capsule, Rfl: 1;  pyridostigmine (MESTINON) 60 MG tablet, Take 60 mg by mouth 2 (two) times daily., Disp: , Rfl: ;  QUEtiapine (SEROQUEL) 300 MG tablet, Take 300 mg by mouth at bedtime., Disp: , Rfl:  venlafaxine (EFFEXOR XR) 150 MG 24 hr capsule, Take 150 mg by mouth daily. , Disp: , Rfl:    ROS: Unobtainable at this time secondary to confusion  Physical Examination: Blood pressure 125/48, pulse 84, temperature 98.5 F (36.9 C),  temperature source Oral, resp. rate 18, SpO2 98.00%.  General - disheveled appearing 67 year old female lying on her side in no acute distress. Heart - Regular rate and rhythm - no murmer Lungs - Clear to auscultation Abdomen - Soft - non tender Extremities - Distal pulses intact - no edema Skin - Warm and dry  The patient is uncooperative with the exam. The patient's husband is present.  Neurologic Examination .Mental Status: The patient is confused. She has not follow most commands. She occasionally follows a simple command. When asked to do something she says yes but does not follow through.. Cranial Nerves: II: Discs not visualized; Visual fields grossly normal, pupils equal, round, reactive to light and accommodation III,IV, VI: ptosis not present, extra-ocular motions intact bilaterally V,VII: smile symmetric, facial light touch  sensation normal bilaterally VIII: hearing normal bilaterally The remainder could not be tested. Motor: The patient was uncooperative with muscle testing although she is moving all extremities equally. Tone and bulk:normal tone throughout; no atrophy noted Sensory: Pinprick and light touch intact throughout, bilaterally Deep Tendon Reflexes: 3+ and symmetric throughout Plantars: Right: downgoing   Left: downgoing Cerebellar: Unable to test Gait: defered CV: pulses palpable throughout   Laboratory Studies:   Basic Metabolic Panel:  Recent Labs Lab 05/18/13 1240 05/18/13 1424  NA 138 139  K 3.4* 3.5  CL 99 107  CO2 20  --   GLUCOSE 257* 222*  BUN 16 16  CREATININE 0.53 0.60  CALCIUM 9.2  --     Liver Function Tests:  Recent Labs Lab 05/18/13 1240  AST 28  ALT 15  ALKPHOS 113  BILITOT 0.6  PROT 7.5  ALBUMIN 3.5   No results found for this basename: LIPASE, AMYLASE,  in the last 168 hours No results found for this basename: AMMONIA,  in the last 168 hours  CBC:  Recent Labs Lab 05/18/13 1240 05/18/13 1424  WBC 21.5*  --    NEUTROABS 18.4*  --   HGB 13.5 14.3  HCT 39.5 42.0  MCV 79.6  --   PLT 436*  --     Cardiac Enzymes:  Recent Labs Lab 05/18/13 1240  TROPONINI <0.30    BNP: No components found with this basename: POCBNP,   CBG:  Recent Labs Lab 05/18/13 1238  GLUCAP 255*    Microbiology: Results for orders placed in visit on 07/12/12  URINE CULTURE     Status: None   Collection Time    07/12/12  1:49 PM      Result Value Range Status   Colony Count NO GROWTH   Final   Organism ID, Bacteria NO GROWTH   Final    Coagulation Studies:  Recent Labs  05/18/13 1240  LABPROT 13.3  INR 1.03    Urinalysis:  Recent Labs Lab 05/18/13 1422  COLORURINE YELLOW  LABSPEC 1.010  PHURINE 6.0  GLUCOSEU NEGATIVE  HGBUR NEGATIVE  BILIRUBINUR MODERATE*  KETONESUR >80*  PROTEINUR 30*  UROBILINOGEN 1.0  NITRITE NEGATIVE  LEUKOCYTESUR NEGATIVE    Lipid Panel:     Component Value Date/Time   CHOL 139 08/21/2012 1045   TRIG 244.0* 08/21/2012 1045   HDL 36.90* 08/21/2012 1045   CHOLHDL 4 08/21/2012 1045   VLDL 48.8* 08/21/2012 1045   LDLCALC 94 01/19/2010 0000    HgbA1C:  Lab Results  Component Value Date   HGBA1C 6.0* 05/13/2012    Urine Drug Screen:     Component Value Date/Time   LABOPIA POSITIVE* 05/18/2013 1422   COCAINSCRNUR NONE DETECTED 05/18/2013 1422   LABBENZ NONE DETECTED 05/18/2013 1422   AMPHETMU NONE DETECTED 05/18/2013 1422   THCU POSITIVE* 05/18/2013 1422   LABBARB NONE DETECTED 05/18/2013 1422    Alcohol Level:  Recent Labs Lab 05/18/13 1240  ETH <11    Other results: EKG: Wandering baseline   Imaging:  Ct Head Wo Contrast 05/18/2013   Atrophy.  No acute intracranial abnormality.   Ct Abdomen Pelvis W Contrast 05/18/2013 1.  No acute abnormalities involving the abdomen or pelvis. 2.  Extensive descending and sigmoid colon diverticulosis without evidence of acute diverticulitis. 3.  Small hiatal hernia.     Dg Abd Acute W/chest 05/18/2013    Slightly distended small bowel loops in the left mid abdomen. Otherwise, benign-appearing abdomen and chest.  Assessment/Plan:  This is a 68 year old female with a history of seizures, substance abuse, and psychiatric disorders who was brought to the emergency department today by her husband stating that the patient stopped talking approximately 3 days ago, has been confused, and has been refusing to take her medications. He has not noted any seizure activity. A CT scan of the head shows atrophy but no acute findings. She does not appear to have any focal deficits. Her urine drug screen was positive for opiates and THC. She has a leukocytosis with a white blood cell count of 21.5 thousand but no documented fever. It does not appear that any of her neurologic deficits are new although they may currently be exacerbated by an ongoing infection and continued substance abuse.   Recommend routine adult EEG study, as well as TSH, vitamin B12 and folate levels and RPR.  We will continue to follow this patient with you.  Delton See PA-C [facility] Pager [phone] 05/18/2013, 4:28 PM  I personally participated in this patient's evaluation and management including the above clinical assessment and management recommendations.  Venetia Maxon M.D. [facility] [phone]

## 2013-05-18 NOTE — ED Notes (Signed)
Patient C/O having expressive aphasia for 2 days. Patient given 4 mg ondansetron IV for C/o nausea.

## 2013-05-18 NOTE — ED Provider Notes (Signed)
History    CSN: [id number] Arrival date & time 05/18/13  1155  First MD Initiated Contact with Patient 05/18/13 1205     Chief Complaint  Patient presents with  . Transient Ischemic Attack   (Consider location/radiation/quality/duration/timing/severity/associated sxs/prior Treatment) HPI Comments: Patient arrives by EMS from home with apparent altered mental status. EMS reports that she has had "expressive aphasia" for the past 2 days. Patient arrives hyperventilating with stable vital signs. She is unable to give any meaningful history. She answers yes to all questioning he does follow some commands. She appears uncomfortable and seems to have pain to palpation of her abdomen.  Patient's husband has arrived and states that she was at word having difficulty since the first of the year. Over the past 2 days her problem has gotten severely worse and she is unable to put a sentence together and not taking her medications. She is also refusing to wear oxygen at night. He states she's not been eating and drinking.  Husband reports was started on oxybutynin in the beginning of June.  Other medications remain the same.  The history is provided by the patient and the EMS personnel. The history is limited by the condition of the patient.   Past Medical History  Diagnosis Date  . Multiple falls   . Orthostatic hypotension 05/17/12  . HYPERTENSION     hx. of; no longer on med. due to orthostatic hypotension  . ANXIETY   . Chronic diastolic heart failure   . DIABETES MELLITUS, TYPE II 2001    IDDM  . Carpal tunnel syndrome of right wrist 01/2013  . 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 Diabetic neuropathy     feet  . Hyperlipidemia   . History of substance abuse     Etoh and narcotics  . Diverticulosis   . COPD (chronic obstructive pulmonary disease)     on home O2 at night, 2l/min.; denies SOB with daily activities  . DEPRESSION   . GERD   . SEIZURE DISORDER     no seizures  in 3 yrs.  . Wears dentures     upper  . Sleep apnea 05/26/2012    sleep study:  AHI 7.5; RDI 10.7; CPAP not recommended   Past Surgical History  Procedure Laterality Date  . Hemorrhoid surgery    . Wrist fracture surgery Right 02/2012  . Appendectomy  1970's  . Vaginal hysterectomy    . Cataract extraction w/ intraocular lens  implant, bilateral Bilateral 2012  . Cholecystectomy  10/26/2000    lap. chole.  . Shoulder arthroscopy w/ subacromial decompression and distal clavicle excision Left 12/28/2011  . Hardware removal Right 02/21/2013    Procedure: REMOVAL DVR PLATE;  Surgeon: Nicki Reaper, MD;  Location: [facility];  Service: Orthopedics;  Laterality: Right;  . Carpal tunnel release Right 02/21/2013    Procedure: CARPAL TUNNEL RELEASE;  Surgeon: Nicki Reaper, MD;  Location: [facility];  Service: Orthopedics;  Laterality: Right;  . Wrist osteotomy Right 02/21/2013    Procedure: OSTEOTOMY ULNAR HEAD;  Surgeon: Nicki Reaper, MD;  Location: [facility];  Service: Orthopedics;  Laterality: Right;   Family History  Problem Relation Age of Onset  . Coronary artery disease Father    History  Substance Use Topics  . Smoking status: Current Every Day Smoker -- 30 years    Types: Cigarettes  . Smokeless tobacco: Never Used  Comment: 3 cig./day  . Alcohol Use: No     Comment: former hx. of Etoh abuse   OB History   Grav Para Term Preterm Abortions TAB SAB Ect Mult Living                 Review of Systems  Unable to perform ROS: Mental status change    Allergies  Penicillins and Tegretol  Home Medications   No current outpatient prescriptions on file. BP 91/53  Pulse 82  Temp(Src) 98.6 F (37 C) (Oral)  Resp 18  SpO2 93% Physical Exam  Constitutional: She appears well-developed and well-nourished. She appears distressed.  Hyperventilating, uncomfortable  HENT:  Head: Normocephalic and atraumatic.  Mouth/Throat: Oropharynx is  clear and moist. No oropharyngeal exudate.  Eyes: Conjunctivae are normal. Pupils are equal, round, and reactive to light.  Neck: Normal range of motion. Neck supple.  No meningimus  Cardiovascular: Normal rate, regular rhythm and normal heart sounds.   No murmur heard. Pulmonary/Chest: Effort normal and breath sounds normal. No respiratory distress. She has no wheezes.  Abdominal: Soft. There is tenderness. There is no rebound and no guarding.  Musculoskeletal: Normal range of motion. She exhibits no edema and no tenderness.  Neurological: She is alert. No cranial nerve deficit. She exhibits normal muscle tone. Coordination normal.  Unable to assess orientation., Follows commands and squeezes hands bilaterally  Skin: Skin is warm.    ED Course  Procedures (including critical care time) Labs Reviewed  CBC WITH DIFFERENTIAL - Abnormal; Notable for the following:    WBC 21.5 (*)    Platelets 436 (*)    Neutrophils Relative % 86 (*)    Neutro Abs 18.4 (*)    Lymphocytes Relative 9 (*)    Monocytes Absolute 1.2 (*)    All other components within normal limits  COMPREHENSIVE METABOLIC PANEL - Abnormal; Notable for the following:    Potassium 3.4 (*)    Glucose, Bld 257 (*)    All other components within normal limits  URINALYSIS, ROUTINE W REFLEX MICROSCOPIC - Abnormal; Notable for the following:    Bilirubin Urine MODERATE (*)    Ketones, ur >80 (*)    Protein, ur 30 (*)    All other components within normal limits  URINE RAPID DRUG SCREEN (HOSP PERFORMED) - Abnormal; Notable for the following:    Opiates POSITIVE (*)    Tetrahydrocannabinol POSITIVE (*)    All other components within normal limits  SALICYLATE LEVEL - Abnormal; Notable for the following:    Salicylate Lvl <2.0 (*)    All other components within normal limits  PRO B NATRIURETIC PEPTIDE - Abnormal; Notable for the following:    Pro B Natriuretic peptide (BNP) 583.7 (*)    All other components within normal limits   GLUCOSE, CAPILLARY - Abnormal; Notable for the following:    Glucose-Capillary 255 (*)    All other components within normal limits  URINE MICROSCOPIC-ADD ON - Abnormal; Notable for the following:    Squamous Epithelial / LPF FEW (*)    All other components within normal limits  CBC - Abnormal; Notable for the following:    WBC 23.7 (*)    Platelets 405 (*)    All other components within normal limits  GLUCOSE, CAPILLARY - Abnormal; Notable for the following:    Glucose-Capillary 172 (*)    All other components within normal limits  POCT I-STAT 3, BLOOD GAS (G3+) - Abnormal; Notable for the following:  pH, Arterial 7.533 (*)    pCO2 arterial 22.8 (*)    Bicarbonate 19.2 (*)    All other components within normal limits  POCT I-STAT, CHEM 8 - Abnormal; Notable for the following:    Glucose, Bld 222 (*)    Calcium, Ion 1.03 (*)    All other components within normal limits  PROTIME-INR  TROPONIN I  ETHANOL  ACETAMINOPHEN LEVEL  CREATININE, SERUM  AMMONIA  TSH  VITAMIN B12  FOLATE RBC  CBC WITH DIFFERENTIAL  CG4 I-STAT (LACTIC ACID)  POCT I-STAT TROPONIN I   Ct Head Wo Contrast  05/18/2013   *RADIOLOGY REPORT*  Clinical Data: Transient ischemic attack. Expressive aphasia.  CT HEAD WITHOUT CONTRAST  Technique:  Contiguous axial images were obtained from the base of the skull through the vertex without contrast.  Comparison: CT scan dated 02/22/2013  Findings: There is no acute intracranial hemorrhage, infarction, or mass lesion.  Mild diffuse cerebral cortical atrophy.  Partial opacification of the left maxillary sinus which is new since the prior exam.  No acute osseous abnormality.  IMPRESSION: Atrophy.  No acute intracranial abnormality.   Original Report Authenticated By: Francene Boyers, M.D.   Ct Abdomen Pelvis W Contrast  05/18/2013   *RADIOLOGY REPORT*  Clinical Data: Acute epigastric abdominal pain.  Acute mental status changes with expressive aphasia.  Prior history of  diverticulitis.  CT ABDOMEN AND PELVIS WITH CONTRAST  Technique:  Multidetector CT imaging of the abdomen and pelvis was performed following the standard protocol during bolus administration of intravenous contrast.  Contrast: OMNIPAQUE IOHEXOL 300 MG/ML IV. Oral contrast was not administered.  Comparison: C t enterography 12/23/2008.  CT abdomen and pelvis 07/26/2008.  Findings: Respiratory motion blurred several of the images of the mid abdomen and patient motion blurred images of the pelvis.  Hepatic cysts, the largest approximating 2 cm in the central portion of the medial segment left lobe; no significant abnormality involving the liver.  Normal appearing spleen, adrenal glands, and kidneys.  Gallbladder surgically absent.  No biliary ductal dilation.  Mild pancreatic atrophy without focal pancreatic parenchymal abnormality.  Severe aorto-iliofemoral atherosclerosis without aneurysm.  Patent visceral arteries.  No significant lymphadenopathy.  Small hiatal hernia.  Stomach otherwise unremarkable.  Normal- appearing small bowel.  Mobile cecum positioned in the right upper quadrant the abdomen.  Majority the colon decompressed.  Extensive descending and sigmoid colon diverticulosis without evidence of acute diverticulitis.  No ascites.  Appendix not visualized, but no pericecal inflammation.  Urinary bladder decompressed and unremarkable.  Numerous pelvic phleboliths.  Uterus surgically absent.  No adnexal masses or free pelvic fluid.  Bone window images demonstrate generalized osseous demineralization and a stable bone island in the first sacral segment.  Mild dependent atelectasis in the visualized lung bases.  Heart size normal and stable.  IMPRESSION:  1.  No acute abnormalities involving the abdomen or pelvis. 2.  Extensive descending and sigmoid colon diverticulosis without evidence of acute diverticulitis. 3.  Small hiatal hernia.   Original Report Authenticated By: Hulan Saas, M.D.   Dg Abd  Acute W/chest  05/18/2013   *RADIOLOGY REPORT*  Clinical Data: Pelvic pain.  ACUTE ABDOMEN SERIES (ABDOMEN 2 VIEW & CHEST 1 VIEW)  Comparison: Chest x-ray dated 12/13/2012 and abdominal radiographs dated 12/17/2008  Findings: Heart size and vascularity are normal.  Interstitial markings are slightly accentuated but this probably due to a shallow inspiration.  There is no free air or free fluid.  There is slightly distended loops  of small bowel in the mid abdomen, nonspecific.  Bilateral duplex ureters are noted. No acute osseous abnormality.  IMPRESSION: Slightly distended small bowel loops in the left mid abdomen. Otherwise, benign-appearing abdomen and chest.   Original Report Authenticated By: Francene Boyers, M.D.   1. Altered mental status   2. DM (diabetes mellitus)   3. Unspecified essential hypertension     MDM  Altered me,ntal status, hyperventilating on arrival, with apparent abdominal pain on exam. Vital stable the patient is anxious. She is given IV Ativan with improvement.  CT head negative. No evidence of perforation or obstruction on plain film of the abdomen. Labs remarkable for leukocytosis. Afebrile. Urinalysis negative, chest x-ray negative Respiratory alkalosis on ABG from hyperventilation.  Question medication effect versus TIA versus seizure unclear why patient is on Mestinon. Husband does not think she has myasthenia gravis.  We'll try to obtain records from Dr. Alena Bills office and consult neurology. History of seizures on keppra. Husband reports no recent seizure activity and compliance with medications. UDS positive for opiates and THC. Husband reports patient went out with friends a few nights ago. Question whether AMS is medication related. Neurology does not favor seizure but will check EEG. Dr. Lendell Caprice to admit.   Date: 05/18/2013  Rate: 74  Rhythm: normal sinus rhythm  QRS Axis: normal  Intervals: PR prolonged  ST/T Wave abnormalities: nonspecific ST/T changes   Conduction Disutrbances:first-degree A-V block   Narrative Interpretation: Diffuse T wave flattening  Old EKG Reviewed: unchanged         Glynn Octave, MD 05/18/13 2237

## 2013-05-19 DIAGNOSIS — R4182 Altered mental status, unspecified: Secondary | ICD-10-CM

## 2013-05-19 DIAGNOSIS — D72829 Elevated white blood cell count, unspecified: Secondary | ICD-10-CM

## 2013-05-19 LAB — CBC WITH DIFFERENTIAL/PLATELET
Basophils Relative: 0 % (ref 0–1)
Eosinophils Absolute: 0.1 10*3/uL (ref 0.0–0.7)
Eosinophils Relative: 0 % (ref 0–5)
HCT: 34.2 % — ABNORMAL LOW (ref 36.0–46.0)
Hemoglobin: 11.3 g/dL — ABNORMAL LOW (ref 12.0–15.0)
MCH: 26.9 pg (ref 26.0–34.0)
MCHC: 33 g/dL (ref 30.0–36.0)
MCV: 81.4 fL (ref 78.0–100.0)
Monocytes Absolute: 1.5 10*3/uL — ABNORMAL HIGH (ref 0.1–1.0)
Monocytes Relative: 11 % (ref 3–12)

## 2013-05-19 LAB — GLUCOSE, CAPILLARY: Glucose-Capillary: 183 mg/dL — ABNORMAL HIGH (ref 70–99)

## 2013-05-19 MED ORDER — FLUCONAZOLE 100 MG PO TABS: 100.0000 mg | ORAL_TABLET | Freq: Every day | ORAL | Status: DC

## 2013-05-19 MED ORDER — ACETAMINOPHEN 325 MG PO TABS: 650.0000 mg | ORAL_TABLET | Freq: Four times a day (QID) | ORAL | Status: DC | PRN

## 2013-05-19 MED ORDER — HYDROCODONE-ACETAMINOPHEN 10-325 MG PO TABS: 0.5000 | ORAL_TABLET | ORAL | Status: DC | PRN

## 2013-05-19 MED ORDER — NYSTATIN 100000 UNIT/GM EX POWD
Freq: Two times a day (BID) | CUTANEOUS | Status: DC
  Administered 2013-05-19: 13:00:00 via TOPICAL
  Filled 2013-05-19: qty 15

## 2013-05-19 MED ORDER — NYSTATIN 100000 UNIT/GM EX POWD: CUTANEOUS | Status: DC

## 2013-05-19 MED ORDER — FLUCONAZOLE 100 MG PO TABS
100.0000 mg | ORAL_TABLET | Freq: Every day | ORAL | Status: DC
  Administered 2013-05-19: 100 mg via ORAL
  Filled 2013-05-19: qty 1

## 2013-05-19 NOTE — Discharge Summary (Signed)
Physician Discharge Summary  Joyce Richardson:[id number] DOB: 11-28-1945 DOA: 05/18/2013  PCP: Astrid Divine, MD  Admit date: 05/18/2013 Discharge date: 05/19/2013  Time spent: greater than 30 minutes  Recommendations for Outpatient Follow-up:  1. Recommend weaning off opiates, as patient is abusing her pills (crushes and snorts, takes extra, uses other illicit substances)  Discharge Diagnoses:  Principal Problem:   Acute delirium Active Problems:   Polysubstance abuse   LEUKOCYTOSIS   SEIZURE DISORDER   DM (diabetes mellitus), type 2, uncontrolled   HYPERLIPIDEMIA   DEPRESSION   Chronic diastolic heart failure   Obesity, unspecified   Orthostatic hypotension intertrigo  Discharge Condition: stable  Filed Weights   05/19/13 0500  Weight: 72 kg (158 lb 11.7 oz)    History of present illness: Joyce Richardson is a 67 y.o. female Brought to ED for confusion for 2 days stopped taking medication. Unable to provide any history. UDS shows opiates and THC. Upon questioning husband reports patient crushes her percocet and snorts them. Taking more than prescribed. [name] out with friend who provided marijuana. No known alcohol use. H/o polysubstance abuse and 30 day drug and alcohol rehab. H/o seizures. No witnessed seizure for several years. Recently started on ditripan. Takes pyridostigmine for orthostatic hypotension. H/o peripheral neuropathy, depression. 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 All history per husband, review of outpatient records  Hospital Course:  Patient started back on outpatient medications and mental status quickly returned to baseline. When confronted about her misuse of medications, marijuana, etc, patient admitted having a problem with substance abuse, but minimized her use and it's deleterious effect on her life.  Social work consulted and gave options for drug counselling.  During hospitalization, also noted was an erythematous rash  under both breasts. Started on nystatin powder and oral diflucan  Consultations:  neuro  Discharge Exam: Filed Vitals:   05/18/13 2018 05/19/13 0500 05/19/13 0600 05/19/13 0947  BP: 91/53  90/48 98/44  Pulse: 82  74 70  Temp: 98.6 F (37 C)  98.1 F (36.7 C) 100.1 F (37.8 C)  TempSrc: Oral  Oral Oral  Resp: 18  18 18   Weight:  72 kg (158 lb 11.7 oz)    SpO2: 93%  97% 97%    General: alert, oriented and appropriate Skin: erythematous rash under breasts  Discharge Instructions   Future Appointments Provider Department Dept Phone   06/28/2013 11:00 AM Huston Foley, MD [facility] [phone]       Medication List    STOP taking these medications       oxybutynin 5 MG tablet  Commonly known as:  DITROPAN      TAKE these medications       acetaminophen 325 MG tablet  Commonly known as:  TYLENOL  Take 2 tablets (650 mg total) by mouth every 6 (six) hours as needed.     albuterol 108 (90 BASE) MCG/ACT inhaler  Commonly known as:  PROAIR HFA  Inhale 2 puffs into the lungs every 4 (four) hours as needed for shortness of breath.     aspirin EC 81 MG tablet  Take 81 mg by mouth daily.     atorvastatin 10 MG tablet  Commonly known as:  LIPITOR  Take 10 mg by mouth daily.     CALCIUM-VITAMIN D PO  Take 1 tablet by mouth daily with breakfast. Calcium 1200- Vitamin D1000 mg.     CENTRUM SILVER PO  Take 1 tablet by mouth  daily.     EFFEXOR XR 150 MG 24 hr capsule  Generic drug:  venlafaxine XR  Take 150 mg by mouth daily.     estrogens (conjugated) 0.625 MG tablet  Commonly known as:  PREMARIN  Take 0.625 mg by mouth daily.     fluconazole 100 MG tablet  Commonly known as:  DIFLUCAN  Take 1 tablet (100 mg total) by mouth daily.     HYDROcodone-acetaminophen 10-325 MG per tablet  Commonly known as:  NORCO  Take 0.5 tablets by mouth every 4 (four) hours as needed for pain. One half tablet 3 times daily for a week then twice daily for a week  then once daily for a week, then stop.     insulin glargine 100 UNIT/ML injection  Commonly known as:  LANTUS  Inject 26 Units into the skin at bedtime.     levETIRAcetam 500 MG 24 hr tablet  Commonly known as:  KEPPRA XR  Take 500 mg by mouth 2 (two) times daily.     Magnesium 250 MG Tabs  Take 250 mg by mouth daily with breakfast.     metFORMIN 500 MG 24 hr tablet  Commonly known as:  GLUCOPHAGE-XR  Take 1,500 mg by mouth daily at 12 noon.     omeprazole 20 MG capsule  Commonly known as:  PRILOSEC  Take 20 mg by mouth Daily.     OVER THE COUNTER MEDICATION  Take 1 tablet by mouth as needed (allergies). Wal-itin. (Allergy Relief)     pregabalin 300 MG capsule  Commonly known as:  LYRICA  Take 2 capsules (600 mg total) by mouth at bedtime.     pyridostigmine 60 MG tablet  Commonly known as:  MESTINON  Take 60 mg by mouth 2 (two) times daily.     QUEtiapine 300 MG tablet  Commonly known as:  SEROQUEL  Take 300 mg by mouth at bedtime.     VICTOZA 18 MG/3ML Sopn  Generic drug:  Liraglutide  Inject 0.6 mg into the skin daily.       Allergies  Allergen Reactions  . Penicillins Swelling    OF LIPS  . Tegretol (Carbamazepine) Other (See Comments)    HYPONATREMIA      The results of significant diagnostics from this hospitalization (including imaging, microbiology, ancillary and laboratory) are listed below for reference.    Significant Diagnostic Studies: Ct Head Wo Contrast  05/18/2013   *RADIOLOGY REPORT*  Clinical Data: Transient ischemic attack. Expressive aphasia.  CT HEAD WITHOUT CONTRAST  Technique:  Contiguous axial images were obtained from the base of the skull through the vertex without contrast.  Comparison: CT scan dated 02/22/2013  Findings: There is no acute intracranial hemorrhage, infarction, or mass lesion.  Mild diffuse cerebral cortical atrophy.  Partial opacification of the left maxillary sinus which is new since the prior exam.  No acute osseous  abnormality.  IMPRESSION: Atrophy.  No acute intracranial abnormality.   Original Report Authenticated By: Francene Boyers, M.D.   Ct Abdomen Pelvis W Contrast  05/18/2013   *RADIOLOGY REPORT*  Clinical Data: Acute epigastric abdominal pain.  Acute mental status changes with expressive aphasia.  Prior history of diverticulitis.  CT ABDOMEN AND PELVIS WITH CONTRAST  Technique:  Multidetector CT imaging of the abdomen and pelvis was performed following the standard protocol during bolus administration of intravenous contrast.  Contrast: OMNIPAQUE IOHEXOL 300 MG/ML IV. Oral contrast was not administered.  Comparison: C t enterography 12/23/2008.  CT abdomen  and pelvis 07/26/2008.  Findings: Respiratory motion blurred several of the images of the mid abdomen and patient motion blurred images of the pelvis.  Hepatic cysts, the largest approximating 2 cm in the central portion of the medial segment left lobe; no significant abnormality involving the liver.  Normal appearing spleen, adrenal glands, and kidneys.  Gallbladder surgically absent.  No biliary ductal dilation.  Mild pancreatic atrophy without focal pancreatic parenchymal abnormality.  Severe aorto-iliofemoral atherosclerosis without aneurysm.  Patent visceral arteries.  No significant lymphadenopathy.  Small hiatal hernia.  Stomach otherwise unremarkable.  Normal- appearing small bowel.  Mobile cecum positioned in the right upper quadrant the abdomen.  Majority the colon decompressed.  Extensive descending and sigmoid colon diverticulosis without evidence of acute diverticulitis.  No ascites.  Appendix not visualized, but no pericecal inflammation.  Urinary bladder decompressed and unremarkable.  Numerous pelvic phleboliths.  Uterus surgically absent.  No adnexal masses or free pelvic fluid.  Bone window images demonstrate generalized osseous demineralization and a stable bone island in the first sacral segment.  Mild dependent atelectasis in the  visualized lung bases.  Heart size normal and stable.  IMPRESSION:  1.  No acute abnormalities involving the abdomen or pelvis. 2.  Extensive descending and sigmoid colon diverticulosis without evidence of acute diverticulitis. 3.  Small hiatal hernia.   Original Report Authenticated By: Hulan Saas, M.D.   Dg Abd Acute W/chest  05/18/2013   *RADIOLOGY REPORT*  Clinical Data: Pelvic pain.  ACUTE ABDOMEN SERIES (ABDOMEN 2 VIEW & CHEST 1 VIEW)  Comparison: Chest x-ray dated 12/13/2012 and abdominal radiographs dated 12/17/2008  Findings: Heart size and vascularity are normal.  Interstitial markings are slightly accentuated but this probably due to a shallow inspiration.  There is no free air or free fluid.  There is slightly distended loops of small bowel in the mid abdomen, nonspecific.  Bilateral duplex ureters are noted. No acute osseous abnormality.  IMPRESSION: Slightly distended small bowel loops in the left mid abdomen. Otherwise, benign-appearing abdomen and chest.   Original Report Authenticated By: Francene Boyers, M.D.    Microbiology: No results found for this or any previous visit (from the past 240 hour(s)).   Labs: Basic Metabolic Panel:  Recent Labs Lab 05/18/13 1240 05/18/13 1424 05/18/13 1738  NA 138 139  --   K 3.4* 3.5  --   CL 99 107  --   CO2 20  --   --   GLUCOSE 257* 222*  --   BUN 16 16  --   CREATININE 0.53 0.60 0.58  CALCIUM 9.2  --   --    Liver Function Tests:  Recent Labs Lab 05/18/13 1240  AST 28  ALT 15  ALKPHOS 113  BILITOT 0.6  PROT 7.5  ALBUMIN 3.5   No results found for this basename: LIPASE, AMYLASE,  in the last 168 hours  Recent Labs Lab 05/18/13 1738  AMMONIA 13   CBC:  Recent Labs Lab 05/18/13 1240 05/18/13 1424 05/18/13 1738 05/19/13 0525  WBC 21.5*  --  23.7* 13.7*  NEUTROABS 18.4*  --   --  7.9*  HGB 13.5 14.3 12.7 11.3*  HCT 39.5 42.0 38.3 34.2*  MCV 79.6  --  81.0 81.4  PLT 436*  --  405* 332   Cardiac  Enzymes:  Recent Labs Lab 05/18/13 1240  TROPONINI <0.30   BNP: BNP (last 3 results)  Recent Labs  05/18/13 1240  PROBNP 583.7*   CBG:  Recent Labs Lab  05/18/13 1238 05/18/13 1951 05/18/13 2313 05/19/13 0626  GLUCAP 255* 172* 169* 123*   Signed:  Careena Degraffenreid L  [facility] 05/19/2013, 10:39 AM

## 2013-05-19 NOTE — Clinical Social Work Psychosocial (Signed)
Clinical Social Work Department  BRIEF PSYCHOSOCIAL ASSESSMENT  Patient:Joyce Richardson Account Number: [id number]  Admit date: 05/18/13 Clinical Social Worker Sabino Niemann, MSW Date/Time:  Referred by: Physician Date Referred: 05/19/13 Referred for   Substance Abuse   Other Referral:  Interview type: Patient  Other interview type: PSYCHOSOCIAL DATA  Living Status:Husband Admitted from facility:  Level of care:  Primary support name: Viernes,Ron  Primary support relationship to patient: Husband Degree of support available:  Strong and vested  CURRENT CONCERNS  Current Concerns   Polysubstance abuse  Other Concerns:  SOCIAL WORK ASSESSMENT / PLAN  CSW met with pt at bedside to discuss patient's current SA abuse. SW introduced self, explained role and provided support to the patient. Patient was very open and honest about her SA abuse. She has struggled with opiate dependence for years and even went to [facility] for 30 days for rehab. Patient reported that she knows it's a problem and she needs to quit but recently she has been under a loty of stress. Patient's husband has MS and her dog has been very sick. SW talked about the patient's use of substances and the patient reported that she uses it to numb emotional "Pain".  SW reiterated the importance of finding a good PCP to help her taper down off of her pain medication. Patient was in agreement with this. SW also spoke with the patient's husband who is an SW. He wants to help her get sober and is very invested in her getting clean. SW provided patient and her husband with SA resources and also recommended the patient start working with a therapist to help her cope with her stress. Patient was very appreciative of SW resources and support            Assessment/plan status: Information/Referral to [facility]  Other assessment/ plan:  Information/referral to community resources:  SA resources    PATIENT'S/FAMILY'S  RESPONSE TO PLAN OF CARE:  Pt  And patient's husband voiced their appreciation of SW support and  Information. Clinical Social Worker will sign off for now as social work intervention is no longer needed. Please consult [place] again if new need arises.   Sabino Niemann, MSW [phone]

## 2013-06-27 ENCOUNTER — Other Ambulatory Visit: Payer: Self-pay

## 2013-06-28 ENCOUNTER — Ambulatory Visit (INDEPENDENT_AMBULATORY_CARE_PROVIDER_SITE_OTHER): Payer: Medicare Other | Admitting: Neurology

## 2013-06-28 ENCOUNTER — Encounter: Payer: Self-pay | Admitting: Neurology

## 2013-06-28 VITALS — BP 110/64 | HR 73 | Temp 97.4°F | Ht 60.5 in | Wt 177.0 lb

## 2013-06-28 DIAGNOSIS — R413 Other amnesia: Secondary | ICD-10-CM

## 2013-06-28 DIAGNOSIS — G40909 Epilepsy, unspecified, not intractable, without status epilepticus: Secondary | ICD-10-CM

## 2013-06-28 NOTE — Patient Instructions (Signed)
I think overall you are doing fairly well but I do want to suggest a few things today:  Please remember, common seizure triggers are: sleep deprivation, dehydration, overheating, stress, hypoglycemia or skipping meals, certain medications or excessive alcohol use. If you have a prolonged seizure over 2-5 minutes or back to back seizures, have someone call 911 or take you to the nearest emergency room. You cannot drive a car or operate any other machinery or vehicle within 6 months of a seizure. Please do not swim alone or take a tub bath or cook with large quantities of boiling water or oil for safety. Take your medicine for seizure prevention regularly and do not skip doses. Try to get a refill on your antiepileptic medication ahead of time, so you are not at risk of running out.   Remember to drink plenty of fluid, eat healthy meals and do not skip any meals. Try to eat protein with a every meal and eat a healthy snack such as fruit or nuts in between meals. Try to keep a regular sleep-wake schedule and try to exercise daily, particularly in the form of walking, 20-30 minutes a day, if you can.   As far as your medications are concerned, I would like to suggest no changes.    As far as diagnostic testing: no new test.  I wish you good luck for your move. You will need to establish care with a PCP, a pulmonologist for your oxygen and a neurologist in [place], [place].

## 2013-06-28 NOTE — Progress Notes (Signed)
Subjective:    Patient ID: Joyce Richardson is a 67 y.o. female.  HPI  Interim history:   Joyce Richardson is a very pleasant 67 year old left-handed woman who presents for followup consultation of her seizure disorder and memory loss. She is accompanied by her husband today. This is her first visit after Dr. Imagene Gurney retirement and was last seen by him on 01/18/2013, at which time Dr. love suggested decreasing her Lyrica and recheck her B12 level. Her falls assessment total score was 20 at the time. She has a complicated underlying medical history of chronic pain, seizures, memory loss, diabetes, depression, anxiety, COPD, sleep disorder with insomnia and sleep apnea, on home oxygen treatment, postural hypotension and recurrent falls with injuries. Her current medications are Mestinon, baby aspirin, hydrocodone as needed, Nicoderm, metformin, Effexor, Seroquel, Flonase, Centrum Silver, B12, Keppra xr 500 mg twice daily, Lantus insulin, Premarin, Lipitor, Provera, Lyrica 300 mg each bedtime, Lipitor, Symbicort.  I reviewed Dr. Imagene Gurney prior notes and the patient's records and below is a summary of the review: 67 year old left-handed woman with past medical history of generalized seizures was treated initially in the 90s with Tegretol but developed low sodium levels and low bone density and was changed to Lamictal. 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 She reports being sober since then. She has sleep apnea and uses a CPAP machine. She does sleep study in October 2005. She was placed on Lyrica in addition to Seroquel for sleep. She has seen Dr. Vickey Huger in the past. She does talk in her sleep and may act out in her dreams. She has fallen out of her bed. She fell in April of last year and fractured her right radius  requiring surgery. She sees Dr. Vear Clock at the pain center. In June of last year she was seen at the emergency room after recurrent falls. CT head and brain MRI showed no definitive stroke. She left the hospital AGAINST MEDICAL ADVICE. She has had memory loss and there is a family history of dementia. She was again admitted to the hospital with a UTI. Lasix was stopped and she was rehydrated. She had developed orthostatic symptoms. She had echocardiogram and carotid Doppler studies which were reported as normal. MRI brain was unchanged. MRA of the head was without significant stenoses. She is on oxygen per pulmonology. In February of this year her MMSE was 20, clock drawing was 4, animal fluency was 6.  She is without new complaints. She has had no recent Seizure.   Her Past Medical History Is Significant For: Past Medical History  Diagnosis Date  . Multiple falls   . Orthostatic hypotension 05/17/12  . HYPERTENSION     hx. of; no longer on med. due to orthostatic hypotension  . ANXIETY   . Chronic diastolic heart failure   . DIABETES MELLITUS, TYPE II 2001    IDDM  . Carpal tunnel syndrome of right wrist 01/2013  . 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 Diabetic neuropathy     feet  . Hyperlipidemia   . History of substance abuse     Etoh and narcotics  . Diverticulosis   . COPD (chronic obstructive pulmonary disease)     on home O2 at night, 2l/min.; denies SOB with daily activities  .  DEPRESSION   . GERD   . SEIZURE DISORDER     no seizures in 3 yrs.  . Wears dentures     upper  . Sleep apnea 05/26/2012    sleep study:  AHI 7.5; RDI 10.7; CPAP not recommended    Her Past Surgical History Is Significant For: Past Surgical History  Procedure Laterality Date  . Hemorrhoid surgery    . Wrist fracture surgery Right 02/2012  . Appendectomy  1970's  . Vaginal hysterectomy    . Cataract extraction w/ intraocular lens  implant, bilateral Bilateral 2012  . Cholecystectomy   10/26/2000    lap. chole.  . Shoulder arthroscopy w/ subacromial decompression and distal clavicle excision Left 12/28/2011  . Hardware removal Right 02/21/2013    Procedure: REMOVAL DVR PLATE;  Surgeon: Nicki Reaper, MD;  Location: [facility];  Service: Orthopedics;  Laterality: Right;  . Carpal tunnel release Right 02/21/2013    Procedure: CARPAL TUNNEL RELEASE;  Surgeon: Nicki Reaper, MD;  Location: [facility];  Service: Orthopedics;  Laterality: Right;  . Wrist osteotomy Right 02/21/2013    Procedure: OSTEOTOMY ULNAR HEAD;  Surgeon: Nicki Reaper, MD;  Location: [facility];  Service: Orthopedics;  Laterality: Right;    Her Family History Is Significant For: Family History  Problem Relation Age of Onset  . Coronary artery disease Father     Her Social History Is Significant For: History   Social History  . Marital Status: Married    Spouse Name: N/A    Number of Children: N/A  . Years of Education: N/A   Occupational History  . Nurse     Retired   Social History Main Topics  . Smoking status: Current Every Day Smoker -- 30 years    Types: Cigarettes  . Smokeless tobacco: Never Used     Comment: 3 cig./day  . Alcohol Use: No     Comment: former hx. of Etoh abuse  . Drug Use: No     Comment: former hx. of narcotic abuse  . Sexually Active: Not Currently   Other Topics Concern  . None   Social History Narrative  . None    Her Allergies Are:  Allergies  Allergen Reactions  . Penicillins Swelling    OF LIPS  . Tegretol (Carbamazepine) Other (See Comments)    HYPONATREMIA  :   Her Current Medications Are:  Outpatient Encounter Prescriptions as of 06/28/2013  Medication Sig Dispense Refill  . acetaminophen (TYLENOL) 325 MG tablet Take 2 tablets (650 mg total) by mouth every 6 (six) hours as needed.      Marland Kitchen albuterol (PROAIR HFA) 108 (90 BASE) MCG/ACT inhaler Inhale 2 puffs into the lungs every 4 (four) hours as needed for  shortness of breath.  1 Inhaler  0  . aspirin EC 81 MG tablet Take 81 mg by mouth daily.      Marland Kitchen atorvastatin (LIPITOR) 10 MG tablet Take 10 mg by mouth daily.      Marland Kitchen estrogens, conjugated, (PREMARIN) 0.625 MG tablet Take 0.625 mg by mouth daily.      Marland Kitchen HYDROcodone-acetaminophen (NORCO) 10-325 MG per tablet Take 0.5 tablets by mouth every 4 (four) hours as needed for pain. One half tablet 3 times daily for a week then twice daily for a week then once daily for a week, then stop.  30 tablet  0  . insulin glargine (LANTUS) 100 UNIT/ML injection Inject 26 Units into the  skin at bedtime.       . levETIRAcetam (KEPPRA XR) 500 MG 24 hr tablet Take 500 mg by mouth 2 (two) times daily.      . Liraglutide (VICTOZA) 18 MG/3ML SOPN Inject 0.6 mg into the skin daily.      . Magnesium 250 MG TABS Take 250 mg by mouth daily with breakfast.      . metFORMIN (GLUCOPHAGE-XR) 500 MG 24 hr tablet Take 1,500 mg by mouth daily at 12 noon.       . Multiple Vitamins-Minerals (CENTRUM SILVER PO) Take 1 tablet by mouth daily.      Marland Kitchen nystatin (MYCOSTATIN/NYSTOP) 100000 UNIT/GM POWD UNDER BREASTS  15 g  0  . omeprazole (PRILOSEC) 20 MG capsule Take 20 mg by mouth Daily.       Marland Kitchen OVER THE COUNTER MEDICATION Take 1 tablet by mouth as needed (allergies). Wal-itin. (Allergy Relief)      . pregabalin (LYRICA) 300 MG capsule Take 2 capsules (600 mg total) by mouth at bedtime.  180 capsule  1  . pyridostigmine (MESTINON) 60 MG tablet Take 60 mg by mouth 2 (two) times daily.      . QUEtiapine (SEROQUEL) 300 MG tablet Take 300 mg by mouth at bedtime.      Marland Kitchen venlafaxine (EFFEXOR XR) 150 MG 24 hr capsule Take 150 mg by mouth daily.       Marland Kitchen CALCIUM-VITAMIN D PO Take 1 tablet by mouth daily with breakfast. Calcium 1200- Vitamin D1000 mg.      . fluconazole (DIFLUCAN) 100 MG tablet Take 1 tablet (100 mg total) by mouth daily.  7 tablet  0   No facility-administered encounter medications on file as of 06/28/2013.  : Review of Systems   Constitutional: Positive for fatigue.  Respiratory: Positive for shortness of breath.   Gastrointestinal: Positive for constipation.  Endocrine: Positive for heat intolerance.  Neurological: Positive for numbness.  Hematological: Bruises/bleeds easily.  Psychiatric/Behavioral: Positive for sleep disturbance and dysphoric mood.    Objective:  Neurologic Exam  Physical Exam Physical Examination:   Filed Vitals:   06/28/13 1134  BP: 110/64  Pulse: 73  Temp:     General Examination: The patient is a very pleasant 66 y.o. female in no acute distress. She appears well-developed and well-nourished and well groomed. She is overweight.   HEENT: Normocephalic, atraumatic, pupils are equal, round and reactive to light and accommodation. Extraocular tracking is good without limitation to gaze excursion or nystagmus noted. Normal smooth pursuit is noted. Hearing is grossly intact. Tympanic membranes are clear bilaterally. Face is symmetric with normal facial animation and normal facial sensation. Speech is clear with no dysarthria noted. There is no hypophonia. There is no lip, neck/head, jaw or voice tremor. Neck is supple with full range of passive and active motion. There are no carotid bruits on auscultation. Oropharynx exam reveals: mild mouth dryness, adequate dental hygiene and mild airway crowding. Mallampati is class II. Tongue protrudes centrally and palate elevates symmetrically.   Chest: Clear to auscultation without wheezing, rhonchi or crackles noted.  Heart: S1+S2+0, regular and normal without murmurs, rubs or gallops noted.   Abdomen: Soft, non-tender and non-distended with normal bowel sounds appreciated on auscultation.  Extremities: There is no pitting edema in the distal lower extremities bilaterally. Pedal pulses are intact.  Skin: Warm and dry without trophic changes noted. There are no varicose veins.  Musculoskeletal: exam reveals no obvious joint deformities,  tenderness or joint swelling or erythema.  Neurologically:  Mental status: The patient is awake, alert and oriented in all 4 spheres. Her memory, attention, language and knowledge are appropriate. There is no aphasia, agnosia, apraxia or anomia. Speech is clear with normal prosody and enunciation. Thought process is linear. Mood is congruent and affect is normal.  Cranial nerves are as described above under HEENT exam. In addition, shoulder shrug is normal with equal shoulder height noted. Motor exam: Normal bulk, strength and tone is noted. There is no drift, tremor or rebound. Romberg is negative. Reflexes are 2+ throughout. Toes are downgoing bilaterally. Fine motor skills are intact with normal finger taps, normal hand movements, normal rapid alternating patting, normal foot taps and normal foot agility.  Cerebellar testing shows no dysmetria or intention tremor on finger to nose testing. There is no truncal or gait ataxia.  Sensory exam is intact to light touch, pinprick, vibration, temperature sense in the upper and lower extremities.  Gait, station and balance: she stands slowly, she has hip pain and knee pain. No veering to one side is noted. No leaning to one side is noted. Posture is age-appropriate and stance is narrow based. She turns in 3 steps. Tandem walk is difficult for her.               Assessment and Plan:   Assessment and Plan:  In summary, TABRINA ESTY is a very pleasant 67 y.o.-year old female with a history of Seizure disorder. Her physical exam is stable and I reassured the patient in that regard.  I had a long chat with the patient and her husband today. She is in the process of relocating to [place], and I have encouraged her to establish care with her PCP, pulmonologist for her oxygen and a neurologist fair. She did not require refills on her Keppra or Lyrica today. She is getting both medicines through the patient assistance program. I reiterated seizure precautions  to her and asked her to stay well-hydrated. Since she is in the process of moving she will not need a followup appointment. I answered all her and her husband's questions and they were in agreement with the plan. She did state that she only has a couple of months worth of Keppra and I will look into whether they could get additional medications through the patient assistance program on the Keppra.

## 2013-08-15 ENCOUNTER — Encounter: Payer: Self-pay | Admitting: Neurology

## 2013-08-15 ENCOUNTER — Ambulatory Visit (INDEPENDENT_AMBULATORY_CARE_PROVIDER_SITE_OTHER): Payer: Medicare Other | Admitting: Neurology

## 2013-08-15 VITALS — BP 118/65 | HR 74 | Resp 18 | Ht 65.5 in | Wt 188.0 lb

## 2013-08-15 DIAGNOSIS — W06XXXA Fall from bed, initial encounter: Secondary | ICD-10-CM

## 2013-08-15 DIAGNOSIS — J441 Chronic obstructive pulmonary disease with (acute) exacerbation: Secondary | ICD-10-CM

## 2013-08-15 DIAGNOSIS — G4752 REM sleep behavior disorder: Secondary | ICD-10-CM

## 2013-08-15 DIAGNOSIS — G40909 Epilepsy, unspecified, not intractable, without status epilepticus: Secondary | ICD-10-CM

## 2013-08-15 DIAGNOSIS — M5430 Sciatica, unspecified side: Secondary | ICD-10-CM

## 2013-08-15 DIAGNOSIS — M5431 Sciatica, right side: Secondary | ICD-10-CM

## 2013-08-15 DIAGNOSIS — R413 Other amnesia: Secondary | ICD-10-CM

## 2013-08-15 DIAGNOSIS — G4754 Parasomnia in conditions classified elsewhere: Secondary | ICD-10-CM | POA: Insufficient documentation

## 2013-08-15 MED ORDER — CLONAZEPAM 0.25 MG PO TBDP: 0.2500 mg | ORAL_TABLET | Freq: Every evening | ORAL | Status: DC | PRN

## 2013-08-15 NOTE — Progress Notes (Signed)
[facility]  Provider:  Melvyn Novas, M D  Referring Provider: Maurice Small, MD Primary Care Physician:  Astrid Divine, MD  Chief Complaint  Patient presents with  . Follow-up    formerly Dr Paulla Fore 10-MMSE:22/30-AFT:7    HPI:  Joyce Richardson is a 67 y.o. female  Is seen here as a referral from Dr. Valentina Lucks for a fall out of bed 6 days ago.  WID appointment. For Dr Frances Furbish.   Patient has been established with Dr.Love, and had seen Dr. Huston Foley just  last month. She called  Monday , requesting an urgent work in.  Joyce Richardson is a very pleasant 67 year old left-handed woman who presented in august  for followup consultation of her seizure disorder and memory loss.   She is again accompanied by her husband today.  Dr. Sandria Manly suggested decreasing her Lyrica and recheck her B12 level. Her falls assessment total score was 20 at the time.  She has a complicated underlying medical history of chronic pain, seizures, memory loss, diabetes, depression, anxiety, COPD, sleep disorder with insomnia and sleep apnea, on home oxygen treatment, postural hypotension and recurrent falls with injuries. Her current medications are Mestinon, baby aspirin, hydrocodone as needed, Nicoderm, metformin, Effexor, Seroquel, Flonase, Centrum Silver, B12, Keppra xr 500 mg twice daily, Lantus insulin, Premarin, Lipitor, Provera, Lyrica 300 mg each bedtime, Lipitor, Symbicort.  I reviewed all prior  prior notes and the patient's records and below is a summary of the review:  67 year old left-handed woman with past medical history of generalized seizures was treated initially in the 90s with Tegretol,  but developed low sodium levels and low bone density and was changed to Lamictal.  MRI brain with and without contrast in March 2010 showed an acoustic neuroma on the left . She has been on Keppra for the past several years,  she has a history of alcohol and drug addiction was  admitted to [facility] in July 2010. She reports being sober since then.   She fell in April of last year and fractured her right radius requiring surgery. She sees Dr. Vear Clock at the pain center. In June of last year she was seen at the emergency room after recurrent falls. CT head and brain MRI showed no definitive stroke. She left the hospital AGAINST MEDICAL ADVICE.  She has had memory loss and there is a family history of dementia. She was again admitted to the hospital with a UTI. Lasix was stopped and she was rehydrated. She had developed orthostatic symptoms. She had echocardiogram and carotid Doppler studies which were reported as normal.  MRI brain was unchanged. MRA of the head was without significant stenoses. She is on oxygen per pulmonology.Dr Octavio Graves OPD - with anasarca.   In February of this year her MMSE was 20, clock drawing was 4, animal fluency was 6.  She has sleep apnea and could not use a CPAP machine. She had a sleep study in October 2005 at [facility] . She is followed by Pulmonology and uses 2 liters of oxygen per nasal canula , but the machine . Dr Sandria Manly ordered a repeat sleep evaluation AHI was 7.5 . No CPAP and no sleep follow up was recommended to Dr Sandria Manly.   She was placed on Lyrica in addition to Seroquel for sleep,.  She does talk in her sleep and may act out in her dreams. She has fallen out of her bed. 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 Husband reports that the patient Joyce Richardson a screen or hilar and before the fall takes place, and she doesn't just slip off bed by turning the wrong way.  But she is a frequent sleep talker she has not had these falls, jumps without preceding scream or thrushing, she literally  jumps out of bed.  Recently she had noticed an increase in frequency-  the last fall was 6 days ago.  Soon after her  Husband discovered that the oxygen canula was reduced from 4 liters to  2 , by accident.   She has leg pain , radiating to the left leg from the lower back.  The  2,3,and 4 th. Toe hurt , the pain radiates form buttock through hip  (right sided only ) down the lateral patella. Sciatic .  She has femoral groin tenderness, she has puffiness over the Sacral bone.     She has fortunately not suffered any injuries, just bruises.   She plans to retire to [place] and may follow up with a sleep colleage in [place] at either [facility] or the      Review of Systems: Out of a complete 14 system review, the patient complains of only the following symptoms, and all other reviewed systems are negative. Chronic leg and back pain,  Sciatica, groin pain, and chronic OPD, hypoxemia. Parasomnia.   History   Social History  . Marital Status: Married    Spouse Name: N/A    Number of Children: N/A  . Years of Education: N/A   Occupational History  . Nurse     Retired   Social History Main Topics  . Smoking status: Current Every Day Smoker -- 30 years    Types: Cigarettes  . Smokeless tobacco: Never Used     Comment: 3 cig./day  . Alcohol Use: No     Comment: former hx. of Etoh abuse  . Drug Use: No     Comment: former hx. of narcotic abuse  . Sexual Activity: Not Currently   Other Topics Concern  . Not on file   Social History Narrative  . No narrative on file    Family History  Problem Relation Age of Onset  . Coronary artery disease Father     Past Medical History  Diagnosis Date  . Multiple falls   . Orthostatic hypotension 05/17/12  . HYPERTENSION     hx. of; no longer on med. due to orthostatic hypotension  . ANXIETY   . Chronic diastolic heart failure   . DIABETES MELLITUS, TYPE II 2001    IDDM  . Carpal tunnel syndrome of right wrist 01/2013  . 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 Diabetic neuropathy     feet  . Hyperlipidemia   . History of substance abuse     Etoh and narcotics  . Diverticulosis   . COPD (chronic  obstructive pulmonary disease)     on home O2 at night, 2l/min.; denies SOB with daily activities  . DEPRESSION   . GERD   . SEIZURE DISORDER     no seizures in 3 yrs.  . Wears dentures     upper  . Sleep apnea 05/26/2012    sleep study:  AHI 7.5; RDI 10.7; CPAP not recommended  . Parasomnia in conditions classified elsewhere     Past Surgical History  Procedure Laterality Date  . Hemorrhoid surgery    . Wrist fracture  surgery Right 02/2012  . Appendectomy  1970's  . Vaginal hysterectomy    . Cataract extraction w/ intraocular lens  implant, bilateral Bilateral 2012  . Cholecystectomy  10/26/2000    lap. chole.  . Shoulder arthroscopy w/ subacromial decompression and distal clavicle excision Left 12/28/2011  . Hardware removal Right 02/21/2013    Procedure: REMOVAL DVR PLATE;  Surgeon: Nicki Reaper, MD;  Location: [facility];  Service: Orthopedics;  Laterality: Right;  . Carpal tunnel release Right 02/21/2013    Procedure: CARPAL TUNNEL RELEASE;  Surgeon: Nicki Reaper, MD;  Location: [facility];  Service: Orthopedics;  Laterality: Right;  . Wrist osteotomy Right 02/21/2013    Procedure: OSTEOTOMY ULNAR HEAD;  Surgeon: Nicki Reaper, MD;  Location: [facility];  Service: Orthopedics;  Laterality: Right;    Current Outpatient Prescriptions  Medication Sig Dispense Refill  . acetaminophen (TYLENOL) 325 MG tablet Take 2 tablets (650 mg total) by mouth every 6 (six) hours as needed.      Marland Kitchen aspirin EC 81 MG tablet Take 81 mg by mouth daily.      Marland Kitchen atorvastatin (LIPITOR) 10 MG tablet Take 10 mg by mouth daily.      Marland Kitchen CALCIUM-VITAMIN D PO Take 1 tablet by mouth daily with breakfast. Calcium 1200- Vitamin D1000 mg.      . estrogens, conjugated, (PREMARIN) 0.625 MG tablet Take 0.625 mg by mouth daily.      Marland Kitchen HYDROcodone-acetaminophen (NORCO) 10-325 MG per tablet Take 0.5 tablets by mouth every 4 (four) hours as needed for pain. One half tablet 3 times daily  for a week then twice daily for a week then once daily for a week, then stop.  30 tablet  0  . insulin glargine (LANTUS) 100 UNIT/ML injection Inject 26 Units into the skin at bedtime.       . levETIRAcetam (KEPPRA XR) 500 MG 24 hr tablet Take 500 mg by mouth 2 (two) times daily.      . Liraglutide 18 MG/3ML SOPN Inject into the skin. Inject 1.8 mg into the skin daily      . Magnesium 250 MG TABS Take 250 mg by mouth daily with breakfast.      . metFORMIN (GLUCOPHAGE-XR) 500 MG 24 hr tablet Take 1,500 mg by mouth daily at 12 noon.       . Multiple Vitamins-Minerals (CENTRUM SILVER PO) Take 1 tablet by mouth daily.      Marland Kitchen nystatin (MYCOSTATIN/NYSTOP) 100000 UNIT/GM POWD UNDER BREASTS  15 g  0  . omeprazole (PRILOSEC) 20 MG capsule Take 20 mg by mouth Daily.       Marland Kitchen OVER THE COUNTER MEDICATION Take 1 tablet by mouth as needed (allergies). Wal-itin. (Allergy Relief)      . pregabalin (LYRICA) 300 MG capsule Take 2 capsules (600 mg total) by mouth at bedtime.  180 capsule  1  . pyridostigmine (MESTINON) 60 MG tablet Take 60 mg by mouth 2 (two) times daily.      . QUEtiapine (SEROQUEL) 300 MG tablet Take 300 mg by mouth at bedtime.      Marland Kitchen venlafaxine (EFFEXOR XR) 150 MG 24 hr capsule Take 150 mg by mouth daily.        No current facility-administered medications for this visit.    Allergies as of 08/15/2013 - Review Complete 08/15/2013  Allergen Reaction Noted  . Penicillins Swelling 08/09/2007  . Tegretol [carbamazepine] Other (See Comments) 05/13/2012  Vitals: BP 118/65  Pulse 74  Resp 18  Ht 5' 5.5" (1.664 m)  Wt 188 lb (85.276 kg)  BMI 30.8 kg/m2 Last Weight:  Wt Readings from Last 1 Encounters:  08/15/13 188 lb (85.276 kg)   Last Height:   Ht Readings from Last 1 Encounters:  08/15/13 5' 5.5" (1.664 m)   Physical exam:  General Examination: The patient is a very pleasant 67 y.o. female in no acute distress. She appears well-developed and well-nourished and well groomed. She  is overweight.  HEENT: Normocephalic, atraumatic, pupils are equal, round and reactive to light and accommodation. Extraocular tracking is good without limitation to gaze excursion or nystagmus noted. Normal smooth pursuit is noted. Hearing is grossly intact. Tympanic membranes are clear bilaterally. Face is symmetric with normal facial animation and normal facial sensation. Speech is clear with no dysarthria noted. There is no hypophonia. There is no lip, neck/head, jaw or voice tremor. Neck is supple with full range of passive and active motion. There are no carotid bruits on auscultation. Oropharynx exam reveals: mild mouth dryness, adequate dental hygiene and mild airway crowding. Mallampati is class II. Tongue protrudes centrally and palate elevates symmetrically.  Chest: Clear to auscultation without wheezing, rhonchi or crackles noted.  Heart: S1+S2+0, regular and normal without murmurs, rubs or gallops noted.  Abdomen: Soft, non-tender and non-distended with normal bowel sounds appreciated on auscultation.  Extremities: There is no pitting edema in the distal lower extremities bilaterally. Pedal pulses are intact. Feet are warm , muscles have the same bulk.  Skin: Warm and dry without trophic changes noted. There are no varicose veins.  Musculoskeletal: exam reveals no obvious joint deformities, tenderness or joint swelling or erythema.  Neurologically:  Mental status: The patient is awake, alert and oriented in all 4 spheres.  Her memory, attention, language and knowledge are appropriate. There is no aphasia, agnosia, apraxia or anomia.  Speech is clear with dysphonia,  normal prosody and enunciation. Thought process is linear. Mood is congruent and affect is normal.  Cranial nerves :   In addition, shoulder shrug is normal with equal shoulder height noted.  Motor exam: Normal bulk, strength and tone is noted. There is no drift, tremor or rebound. Romberg is negative. Reflexes are 2+  throughout. Toes are downgoing bilaterally. Fine motor skills are intact with normal finger taps, normal hand movements, normal rapid alternating patting, normal foot taps and normal foot agility.  No pain with leg rise.  Cerebellar testing Richardson no dysmetria or intention tremor on finger to nose testing.  There is no truncal or gait ataxia.  Sensory exam is intact to light touch, pinprick, vibration, temperature sense in the upper  extremities.  Gait, station and balance: she stands slowly, she has hip pain and knee pain.  No veering to one side is noted. No leaning to one side is noted. Posture is age-appropriate and stance is narrow based. She turns in 3 steps.  Tandem walk is difficult for her.  Assessment and Plan:    Chronic leg pain , aggravated by the recent fall-  Patient can not use narcotics.  Character of the pain:  deep, dull, drill like . Sciatic - PT will be prescribed,  ROM exercises.   Parasomnia, mostly ocuring at 4-5 AM - REM BD likely by timing and duration of 1-2 minutes  , patient should not use Klonopin due to COPD and addiction history.  Possible PTSD relation. ?  Response to Trazodone was paradox. Sleep paralysis.  Continue Oxygen supplement.  Klonopin can be used prn, when traveling or when sleeping a in an unfamiliar environment. NOT Daily.   Back pain ; Dr Cleophas Dunker. EMG and [facility] ,[place]  Lower back .

## 2013-08-22 ENCOUNTER — Encounter: Payer: Self-pay | Admitting: Neurology

## 2013-08-24 ENCOUNTER — Other Ambulatory Visit: Payer: Medicare Other

## 2013-08-28 ENCOUNTER — Ambulatory Visit: Payer: Medicare Other | Admitting: Physical Therapy

## 2013-08-30 ENCOUNTER — Encounter (HOSPITAL_COMMUNITY): Payer: Self-pay | Admitting: Emergency Medicine

## 2013-08-30 ENCOUNTER — Emergency Department (HOSPITAL_COMMUNITY): Payer: Medicare Other

## 2013-08-30 ENCOUNTER — Observation Stay (HOSPITAL_COMMUNITY)
Admission: EM | Admit: 2013-08-30 | Discharge: 2013-09-01 | Disposition: A | Discharge status: Home / Self Care | Payer: Medicare Other | Attending: Internal Medicine | Admitting: Internal Medicine

## 2013-08-30 ENCOUNTER — Emergency Department (HOSPITAL_COMMUNITY)
Admission: EM | Admit: 2013-08-30 | Discharge: 2013-08-30 | Disposition: A | Discharge status: Home / Self Care | Payer: Medicare Other | Attending: Emergency Medicine | Admitting: Emergency Medicine

## 2013-08-30 DIAGNOSIS — N390 Urinary tract infection, site not specified: Secondary | ICD-10-CM

## 2013-08-30 DIAGNOSIS — I509 Heart failure, unspecified: Secondary | ICD-10-CM | POA: Insufficient documentation

## 2013-08-30 DIAGNOSIS — F1011 Alcohol abuse, in remission: Secondary | ICD-10-CM

## 2013-08-30 DIAGNOSIS — Z9189 Other specified personal risk factors, not elsewhere classified: Secondary | ICD-10-CM

## 2013-08-30 DIAGNOSIS — E119 Type 2 diabetes mellitus without complications: Secondary | ICD-10-CM

## 2013-08-30 DIAGNOSIS — R52 Pain, unspecified: Secondary | ICD-10-CM

## 2013-08-30 DIAGNOSIS — F329 Major depressive disorder, single episode, unspecified: Secondary | ICD-10-CM | POA: Insufficient documentation

## 2013-08-30 DIAGNOSIS — E1165 Type 2 diabetes mellitus with hyperglycemia: Secondary | ICD-10-CM | POA: Diagnosis present

## 2013-08-30 DIAGNOSIS — F17200 Nicotine dependence, unspecified, uncomplicated: Secondary | ICD-10-CM

## 2013-08-30 DIAGNOSIS — R404 Transient alteration of awareness: Principal | ICD-10-CM | POA: Insufficient documentation

## 2013-08-30 DIAGNOSIS — R635 Abnormal weight gain: Secondary | ICD-10-CM

## 2013-08-30 DIAGNOSIS — S8263XA Displaced fracture of lateral malleolus of unspecified fibula, initial encounter for closed fracture: Secondary | ICD-10-CM | POA: Insufficient documentation

## 2013-08-30 DIAGNOSIS — E669 Obesity, unspecified: Secondary | ICD-10-CM

## 2013-08-30 DIAGNOSIS — Z23 Encounter for immunization: Secondary | ICD-10-CM | POA: Insufficient documentation

## 2013-08-30 DIAGNOSIS — K219 Gastro-esophageal reflux disease without esophagitis: Secondary | ICD-10-CM | POA: Insufficient documentation

## 2013-08-30 DIAGNOSIS — G4754 Parasomnia in conditions classified elsewhere: Secondary | ICD-10-CM

## 2013-08-30 DIAGNOSIS — G4730 Sleep apnea, unspecified: Secondary | ICD-10-CM | POA: Insufficient documentation

## 2013-08-30 DIAGNOSIS — R0789 Other chest pain: Secondary | ICD-10-CM

## 2013-08-30 DIAGNOSIS — E1142 Type 2 diabetes mellitus with diabetic polyneuropathy: Secondary | ICD-10-CM | POA: Insufficient documentation

## 2013-08-30 DIAGNOSIS — I498 Other specified cardiac arrhythmias: Secondary | ICD-10-CM

## 2013-08-30 DIAGNOSIS — R252 Cramp and spasm: Secondary | ICD-10-CM

## 2013-08-30 DIAGNOSIS — K5730 Diverticulosis of large intestine without perforation or abscess without bleeding: Secondary | ICD-10-CM

## 2013-08-30 DIAGNOSIS — E785 Hyperlipidemia, unspecified: Secondary | ICD-10-CM | POA: Insufficient documentation

## 2013-08-30 DIAGNOSIS — M79601 Pain in right arm: Secondary | ICD-10-CM

## 2013-08-30 DIAGNOSIS — I5032 Chronic diastolic (congestive) heart failure: Secondary | ICD-10-CM | POA: Insufficient documentation

## 2013-08-30 DIAGNOSIS — K589 Irritable bowel syndrome without diarrhea: Secondary | ICD-10-CM

## 2013-08-30 DIAGNOSIS — R350 Frequency of micturition: Secondary | ICD-10-CM

## 2013-08-30 DIAGNOSIS — R0902 Hypoxemia: Secondary | ICD-10-CM | POA: Insufficient documentation

## 2013-08-30 DIAGNOSIS — R05 Cough: Secondary | ICD-10-CM

## 2013-08-30 DIAGNOSIS — J4489 Other specified chronic obstructive pulmonary disease: Secondary | ICD-10-CM

## 2013-08-30 DIAGNOSIS — F3289 Other specified depressive episodes: Secondary | ICD-10-CM | POA: Insufficient documentation

## 2013-08-30 DIAGNOSIS — R7401 Elevation of levels of liver transaminase levels: Secondary | ICD-10-CM | POA: Insufficient documentation

## 2013-08-30 DIAGNOSIS — IMO0002 Reserved for concepts with insufficient information to code with codable children: Secondary | ICD-10-CM

## 2013-08-30 DIAGNOSIS — G8921 Chronic pain due to trauma: Secondary | ICD-10-CM

## 2013-08-30 DIAGNOSIS — Z860100 Personal history of colon polyps, unspecified: Secondary | ICD-10-CM

## 2013-08-30 DIAGNOSIS — J449 Chronic obstructive pulmonary disease, unspecified: Secondary | ICD-10-CM | POA: Insufficient documentation

## 2013-08-30 DIAGNOSIS — W06XXXA Fall from bed, initial encounter: Secondary | ICD-10-CM | POA: Insufficient documentation

## 2013-08-30 DIAGNOSIS — J42 Unspecified chronic bronchitis: Secondary | ICD-10-CM

## 2013-08-30 DIAGNOSIS — J209 Acute bronchitis, unspecified: Secondary | ICD-10-CM

## 2013-08-30 DIAGNOSIS — R7402 Elevation of levels of lactic acid dehydrogenase (LDH): Secondary | ICD-10-CM | POA: Insufficient documentation

## 2013-08-30 DIAGNOSIS — I10 Essential (primary) hypertension: Secondary | ICD-10-CM | POA: Insufficient documentation

## 2013-08-30 DIAGNOSIS — W19XXXA Unspecified fall, initial encounter: Secondary | ICD-10-CM | POA: Insufficient documentation

## 2013-08-30 DIAGNOSIS — J9610 Chronic respiratory failure, unspecified whether with hypoxia or hypercapnia: Secondary | ICD-10-CM

## 2013-08-30 DIAGNOSIS — Z79899 Other long term (current) drug therapy: Secondary | ICD-10-CM | POA: Insufficient documentation

## 2013-08-30 DIAGNOSIS — F411 Generalized anxiety disorder: Secondary | ICD-10-CM

## 2013-08-30 DIAGNOSIS — Y939 Activity, unspecified: Secondary | ICD-10-CM | POA: Insufficient documentation

## 2013-08-30 DIAGNOSIS — I951 Orthostatic hypotension: Secondary | ICD-10-CM

## 2013-08-30 DIAGNOSIS — F29 Unspecified psychosis not due to a substance or known physiological condition: Secondary | ICD-10-CM

## 2013-08-30 DIAGNOSIS — S82892A Other fracture of left lower leg, initial encounter for closed fracture: Secondary | ICD-10-CM

## 2013-08-30 DIAGNOSIS — R410 Disorientation, unspecified: Secondary | ICD-10-CM

## 2013-08-30 DIAGNOSIS — F1910 Other psychoactive substance abuse, uncomplicated: Secondary | ICD-10-CM

## 2013-08-30 DIAGNOSIS — G40909 Epilepsy, unspecified, not intractable, without status epilepticus: Secondary | ICD-10-CM | POA: Insufficient documentation

## 2013-08-30 DIAGNOSIS — R030 Elevated blood-pressure reading, without diagnosis of hypertension: Secondary | ICD-10-CM

## 2013-08-30 DIAGNOSIS — Z8719 Personal history of other diseases of the digestive system: Secondary | ICD-10-CM

## 2013-08-30 DIAGNOSIS — I5033 Acute on chronic diastolic (congestive) heart failure: Secondary | ICD-10-CM

## 2013-08-30 DIAGNOSIS — R569 Unspecified convulsions: Secondary | ICD-10-CM

## 2013-08-30 DIAGNOSIS — Z9283 Personal history of failed moderate sedation: Secondary | ICD-10-CM

## 2013-08-30 DIAGNOSIS — R5381 Other malaise: Secondary | ICD-10-CM

## 2013-08-30 DIAGNOSIS — D72829 Elevated white blood cell count, unspecified: Secondary | ICD-10-CM

## 2013-08-30 DIAGNOSIS — J309 Allergic rhinitis, unspecified: Secondary | ICD-10-CM

## 2013-08-30 DIAGNOSIS — F1911 Other psychoactive substance abuse, in remission: Secondary | ICD-10-CM

## 2013-08-30 DIAGNOSIS — R55 Syncope and collapse: Secondary | ICD-10-CM

## 2013-08-30 DIAGNOSIS — Z794 Long term (current) use of insulin: Secondary | ICD-10-CM | POA: Insufficient documentation

## 2013-08-30 DIAGNOSIS — R059 Cough, unspecified: Secondary | ICD-10-CM

## 2013-08-30 DIAGNOSIS — E1149 Type 2 diabetes mellitus with other diabetic neurological complication: Secondary | ICD-10-CM | POA: Insufficient documentation

## 2013-08-30 DIAGNOSIS — R748 Abnormal levels of other serum enzymes: Secondary | ICD-10-CM

## 2013-08-30 DIAGNOSIS — G4734 Idiopathic sleep related nonobstructive alveolar hypoventilation: Secondary | ICD-10-CM

## 2013-08-30 DIAGNOSIS — R5383 Other fatigue: Secondary | ICD-10-CM

## 2013-08-30 DIAGNOSIS — Y929 Unspecified place or not applicable: Secondary | ICD-10-CM | POA: Insufficient documentation

## 2013-08-30 DIAGNOSIS — D649 Anemia, unspecified: Secondary | ICD-10-CM | POA: Insufficient documentation

## 2013-08-30 DIAGNOSIS — R7989 Other specified abnormal findings of blood chemistry: Secondary | ICD-10-CM | POA: Insufficient documentation

## 2013-08-30 DIAGNOSIS — R202 Paresthesia of skin: Secondary | ICD-10-CM

## 2013-08-30 LAB — POCT I-STAT TROPONIN I: Troponin i, poc: 0.02 ng/mL (ref 0.00–0.08)

## 2013-08-30 LAB — HEPATIC FUNCTION PANEL
ALT: 150 U/L — ABNORMAL HIGH (ref 0–35)
Albumin: 3.3 g/dL — ABNORMAL LOW (ref 3.5–5.2)
Alkaline Phosphatase: 105 U/L (ref 39–117)
Indirect Bilirubin: 0.3 mg/dL (ref 0.3–0.9)
Total Protein: 7 g/dL (ref 6.0–8.3)

## 2013-08-30 LAB — BASIC METABOLIC PANEL
Calcium: 8.7 mg/dL (ref 8.4–10.5)
GFR calc Af Amer: >90 mL/min (ref >90)
GFR calc non Af Amer: 84 mL/min — ABNORMAL LOW (ref >90)
Glucose, Bld: 259 mg/dL — ABNORMAL HIGH (ref 70–99)
Potassium: 4.9 mEq/L (ref 3.5–5.1)
Sodium: 131 mEq/L — ABNORMAL LOW (ref 135–145)

## 2013-08-30 LAB — DIFFERENTIAL
Basophils Relative: 0 % (ref 0–1)
Eosinophils Absolute: 0.1 10*3/uL (ref 0.0–0.7)
Eosinophils Relative: 0 % (ref 0–5)
Lymphs Abs: 3.5 10*3/uL (ref 0.7–4.0)
Monocytes Absolute: 1.3 10*3/uL — ABNORMAL HIGH (ref 0.1–1.0)
Monocytes Relative: 8 % (ref 3–12)

## 2013-08-30 LAB — RAPID URINE DRUG SCREEN, HOSP PERFORMED
Barbiturates: NOT DETECTED
Cocaine: NOT DETECTED
Opiates: NOT DETECTED

## 2013-08-30 LAB — GLUCOSE, CAPILLARY: Glucose-Capillary: 267 mg/dL — ABNORMAL HIGH (ref 70–99)

## 2013-08-30 LAB — URINALYSIS, ROUTINE W REFLEX MICROSCOPIC
Bilirubin Urine: NEGATIVE
Glucose, UA: 250 mg/dL — AB
Leukocytes, UA: NEGATIVE
Nitrite: POSITIVE — AB
Specific Gravity, Urine: 1.03 (ref 1.005–1.030)
pH: 5.5 (ref 5.0–8.0)

## 2013-08-30 LAB — CBC
HCT: 37.7 % (ref 36.0–46.0)
MCH: 26.8 pg (ref 26.0–34.0)
MCHC: 32.1 g/dL (ref 30.0–36.0)
Platelets: 314 10*3/uL (ref 150–400)
RDW: 14.5 % (ref 11.5–15.5)
WBC: 16.2 10*3/uL — ABNORMAL HIGH (ref 4.0–10.5)

## 2013-08-30 LAB — PROTIME-INR: INR: 1.05 (ref 0.00–1.49)

## 2013-08-30 LAB — URINE MICROSCOPIC-ADD ON

## 2013-08-30 LAB — ETHANOL: Alcohol, Ethyl (B): <11 mg/dL (ref 0–11)

## 2013-08-30 LAB — APTT: aPTT: 31 seconds (ref 24–37)

## 2013-08-30 MED ORDER — ONDANSETRON HCL 4 MG/2ML IJ SOLN: 4.0000 mg | Freq: Four times a day (QID) | INTRAMUSCULAR | Status: DC | PRN

## 2013-08-30 MED ORDER — DEXTROSE 5 % IV SOLN: 1.0000 g | Freq: Once | INTRAVENOUS | Status: DC

## 2013-08-30 MED ORDER — ONDANSETRON HCL 4 MG PO TABS: 4.0000 mg | ORAL_TABLET | Freq: Four times a day (QID) | ORAL | Status: DC | PRN

## 2013-08-30 MED ORDER — SODIUM CHLORIDE 0.9 % IV SOLN: INTRAVENOUS | Status: DC

## 2013-08-30 MED ORDER — NALOXONE HCL 0.4 MG/ML IJ SOLN
0.4000 mg | Freq: Once | INTRAMUSCULAR | Status: AC
  Administered 2013-08-30: 0.4 mg via INTRAVENOUS
  Filled 2013-08-30: qty 1

## 2013-08-30 MED ORDER — POTASSIUM CHLORIDE IN NACL 20-0.9 MEQ/L-% IV SOLN
INTRAVENOUS | Status: DC
  Administered 2013-08-30: 23:00:00 via INTRAVENOUS
  Filled 2013-08-30: qty 1000

## 2013-08-30 MED ORDER — CIPROFLOXACIN HCL 250 MG PO TABS
250.0000 mg | ORAL_TABLET | Freq: Two times a day (BID) | ORAL | Status: DC
  Administered 2013-08-31 – 2013-09-01 (×3): 250 mg via ORAL
  Filled 2013-08-30 (×5): qty 1

## 2013-08-30 MED ORDER — NALOXONE HCL 0.4 MG/ML IJ SOLN: 0.4000 mg | INTRAMUSCULAR | Status: DC | PRN

## 2013-08-30 MED ORDER — INSULIN ASPART 100 UNIT/ML ~~LOC~~ SOLN
0.0000 [IU] | Freq: Three times a day (TID) | SUBCUTANEOUS | Status: DC
  Administered 2013-08-31: 3 [IU] via SUBCUTANEOUS
  Administered 2013-08-31: 12:00:00 2 [IU] via SUBCUTANEOUS
  Administered 2013-09-01: 1 [IU] via SUBCUTANEOUS
  Administered 2013-09-01: 3 [IU] via SUBCUTANEOUS

## 2013-08-30 MED ORDER — CIPROFLOXACIN IN D5W 400 MG/200ML IV SOLN
400.0000 mg | Freq: Once | INTRAVENOUS | Status: AC
  Administered 2013-08-30: 400 mg via INTRAVENOUS
  Filled 2013-08-30 (×2): qty 200

## 2013-08-30 MED ORDER — INSULIN GLARGINE 100 UNIT/ML ~~LOC~~ SOLN
26.0000 [IU] | Freq: Every day | SUBCUTANEOUS | Status: DC
  Administered 2013-08-30 – 2013-08-31 (×2): 26 [IU] via SUBCUTANEOUS
  Filled 2013-08-30 (×3): qty 0.26

## 2013-08-30 MED ORDER — SODIUM CHLORIDE 0.9 % IJ SOLN
3.0000 mL | Freq: Two times a day (BID) | INTRAMUSCULAR | Status: DC
  Administered 2013-08-30: 23:00:00 3 mL via INTRAVENOUS

## 2013-08-30 MED ORDER — VENLAFAXINE HCL ER 150 MG PO CP24
150.0000 mg | ORAL_CAPSULE | Freq: Every day | ORAL | Status: DC
  Administered 2013-08-31 – 2013-09-01 (×2): 150 mg via ORAL
  Filled 2013-08-30 (×2): qty 1

## 2013-08-30 MED ORDER — INFLUENZA VAC SPLIT QUAD 0.5 ML IM SUSP
0.5000 mL | INTRAMUSCULAR | Status: AC | PRN
  Administered 2013-09-01: 0.5 mL via INTRAMUSCULAR
  Filled 2013-08-30: qty 0.5

## 2013-08-30 NOTE — ED Notes (Signed)
Pt states she fell out of bed yesterday morning causing trauma to her left ankle.  Per pt pain shot all the way up left leg to hip.  Left ankle is swollen and red.

## 2013-08-30 NOTE — ED Provider Notes (Signed)
Medical screening examination/treatment/procedure(s) were conducted as a shared visit with non-physician practitioner(s) and myself.  I personally evaluated the patient during the encounter Pt presenting with AMS who awakes with narcan.  Pt has no c/o but recent sx with ankle fx earlier today.  Pt denies taking meds but becomes somnolent again arousing after narcan.  Will admit for obs.  Gwyneth Sprout, MD 08/30/13 2257

## 2013-08-30 NOTE — H&P (Signed)
PCP:   Astrid Divine, MD   Chief Complaint:  confusion  HPI: 67 yo female h/o diastolic chf, polysubstance abuse with overdose in past, etoh abuse, sz disorder unclear if in setting of drug abuse, dm, htn comes in with ems with somnulence.  Husband reported this is how she was when she overdosed on her meds before.  No report of fevers, n/v.d.  Pt received narcan x 2 doses in ED with good response.  Pt denies taking any xanax, pain pills, denies any etoh abuse.  She is currently awake and able to answer questions.  Husband is not.  She denies any recent illnesses.    Review of Systems:  Positive and negative as per HPI otherwise all other systems are negative  Past Medical History: Past Medical History  Diagnosis Date  . Multiple falls   . Orthostatic hypotension 05/17/12  . HYPERTENSION     hx. of; no longer on med. due to orthostatic hypotension  . ANXIETY   . Chronic diastolic heart failure   . DIABETES MELLITUS, TYPE II 2001    IDDM  . Carpal tunnel syndrome of right wrist 01/2013  . 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 Diabetic neuropathy     feet  . Hyperlipidemia   . History of substance abuse     Etoh and narcotics  . Diverticulosis   . DEPRESSION   . GERD   . SEIZURE DISORDER     no seizures in 3 yrs.  . Wears dentures     upper  . Sleep apnea 05/26/2012    sleep study:  AHI 7.5; RDI 10.7; CPAP not recommended  . 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 COPD (chronic obstructive pulmonary disease)     on home O2 at night, 2l/min.; denies SOB with daily activities   Past Surgical History  Procedure Laterality Date  . Hemorrhoid surgery    . Wrist fracture surgery Right 02/2012  . Appendectomy  1970's  . Vaginal hysterectomy    . Cataract extraction w/ intraocular lens  implant, bilateral Bilateral 2012  . Cholecystectomy  10/26/2000    lap. chole.  . Shoulder arthroscopy w/ subacromial decompression and distal clavicle excision  Left 12/28/2011  . Hardware removal Right 02/21/2013    Procedure: REMOVAL DVR PLATE;  Surgeon: Nicki Reaper, MD;  Location: [facility];  Service: Orthopedics;  Laterality: Right;  . Carpal tunnel release Right 02/21/2013    Procedure: CARPAL TUNNEL RELEASE;  Surgeon: Nicki Reaper, MD;  Location: [facility];  Service: Orthopedics;  Laterality: Right;  . Wrist osteotomy Right 02/21/2013    Procedure: OSTEOTOMY ULNAR HEAD;  Surgeon: Nicki Reaper, MD;  Location: [facility];  Service: Orthopedics;  Laterality: Right;    Medications: Prior to Admission medications   Medication Sig Start Date End Date Taking? Authorizing Provider  acetaminophen (TYLENOL) 325 MG tablet Take 2 tablets (650 mg total) by mouth every 6 (six) hours as needed. 05/19/13  Yes Christiane Ha, MD  atorvastatin (LIPITOR) 10 MG tablet Take 10 mg by mouth daily. 10/26/11  Yes Madelin Headings, MD  CALCIUM-VITAMIN D PO Take 1 tablet by mouth daily with breakfast. Calcium 1200- Vitamin D1000 mg.   Yes Historical Provider, MD  clonazePAM (KLONOPIN) 0.25 MG disintegrating tablet Take 1 tablet (0.25 mg total) by mouth at bedtime as needed. 08/15/13  Yes Carmen Dohmeier, MD  estrogens, conjugated, (PREMARIN) 0.625 MG tablet Take 0.625  mg by mouth daily.   Yes Madelin Headings, MD  insulin glargine (LANTUS) 100 UNIT/ML injection Inject 26 Units into the skin at bedtime.  05/17/12  Yes Standley Brooking, MD  levETIRAcetam (KEPPRA XR) 500 MG 24 hr tablet Take 500 mg by mouth 2 (two) times daily.   Yes Historical Provider, MD  Liraglutide 18 MG/3ML SOPN Inject into the skin. Inject 1.8 mg into the skin daily   Yes Historical Provider, MD  Magnesium 250 MG TABS Take 250 mg by mouth daily with breakfast.   Yes Historical Provider, MD  metFORMIN (GLUCOPHAGE-XR) 500 MG 24 hr tablet Take 1,500 mg by mouth daily at 12 noon.    Yes Historical Provider, MD  Multiple Vitamins-Minerals (CENTRUM SILVER PO) Take 1  tablet by mouth daily.   Yes Historical Provider, MD  omeprazole (PRILOSEC) 20 MG capsule Take 20 mg by mouth Daily.  09/22/11  Yes Historical Provider, MD  OVER THE COUNTER MEDICATION Take 1 tablet by mouth as needed (allergies). Wal-itin. (Allergy Relief)   Yes Historical Provider, MD  pregabalin (LYRICA) 300 MG capsule Take 2 capsules (600 mg total) by mouth at bedtime. 03/08/13  Yes Huston Foley, MD  pyridostigmine (MESTINON) 60 MG tablet Take 60 mg by mouth 2 (two) times daily.   Yes Historical Provider, MD  QUEtiapine (SEROQUEL) 300 MG tablet Take 300 mg by mouth at bedtime.   Yes Historical Provider, MD  venlafaxine (EFFEXOR XR) 150 MG 24 hr capsule Take 150 mg by mouth daily.    Yes Historical Provider, MD    Allergies:   Allergies  Allergen Reactions  . Penicillins Swelling    OF LIPS  . Tegretol [Carbamazepine] Other (See Comments)    HYPONATREMIA    Social History:  reports that she has been smoking Cigarettes.  She has been smoking about 0.00 packs per day for the past 30 years. She has never used smokeless tobacco. She reports that she does not drink alcohol or use illicit drugs.  Family History: Family History  Problem Relation Age of Onset  . Coronary artery disease Father     Physical Exam: Filed Vitals:   08/30/13 1806 08/30/13 1903 08/30/13 1924 08/30/13 1955  BP: 131/79 131/79 108/71   Pulse: 112 105 106   Temp:    98.5 F (36.9 C)  TempSrc:    Oral  Resp: 18 20  18   SpO2: 95% 97% 95%    General appearance: alert, cooperative and no distress Head: Normocephalic, without obvious abnormality, atraumatic Eyes: negative Nose: Nares normal. Septum midline. Mucosa normal. No drainage or sinus tenderness. Neck: no JVD and supple, symmetrical, trachea midline Lungs: clear to auscultation bilaterally Heart: regular rate and rhythm, S1, S2 normal, no murmur, click, rub or gallop Abdomen: soft, non-tender; bowel sounds normal; no masses,  no  organomegaly Extremities: extremities normal, atraumatic, no cyanosis or edema Pulses: 2+ and symmetric Skin: Skin color, texture, turgor normal. No rashes or lesions Neurologic: Grossly normal  Labs on Admission:   Recent Labs  08/30/13 1533  NA 131*  K 4.9  CL 98  CO2 22  GLUCOSE 259*  BUN 18  CREATININE 0.77  CALCIUM 8.7    Recent Labs  08/30/13 1623  AST 358*  ALT 150*  ALKPHOS 105  BILITOT 0.4  PROT 7.0  ALBUMIN 3.3*    Recent Labs  08/30/13 1533  WBC 16.2*  NEUTROABS 11.3*  HGB 12.1  HCT 37.7  MCV 83.4  PLT 314  Recent Labs  08/30/13 1623  TROPONINI <0.30   Radiological Exams on Admission: Dg Ankle Complete Left  08/30/2013   CLINICAL DATA:  Fall, twisted ankle with lateral pain and swelling  EXAM: LEFT ANKLE COMPLETE - 3+ VIEW  COMPARISON:  None.  FINDINGS: A small avulsion fracture at the distal tip of the lateral malleolus with associated overlying soft tissue swelling. Plantar calcaneal spurring. No ankle joint effusion. The remainder of the visualized bones and joints are intact and unremarkable.  IMPRESSION: Small avulsion fracture at the distal tip of the lateral malleolus.   Electronically Signed   By: Malachy Moan M.D.   On: 08/30/2013 01:58   Ct Head Wo Contrast  08/30/2013   CLINICAL DATA:  Altered mental status.  EXAM: CT HEAD WITHOUT CONTRAST  TECHNIQUE: Contiguous axial images were obtained from the base of the skull through the vertex without intravenous contrast.  COMPARISON:  05/18/2013.  FINDINGS: Interval ill-defined areas of low density involving the gray and white matter in the left frontoparietal region and right parietal occipital region. No intracranial hemorrhage or mass lesion. Mild patchy white matter low density in both frontal lobes. Diffusely enlarged ventricles and subarachnoid spaces with little change. Unremarkable bones and included paranasal sinuses.  IMPRESSION: 1. Findings suspicious for acute bilateral cerebral  infarctions, as described above. Further evaluation with MRI and possibly MRA is recommended. 2. Atrophy and mild chronic small vessel white matter ischemic changes.  These results were called by telephone at the time of interpretation on 08/30/2013 at 3:56 PM to [facility] , who verbally acknowledged these results.   Electronically Signed   By: Gordan Payment M.D.   On: 08/30/2013 15:55   Mr Angiogram Head Wo Contrast  08/30/2013   CLINICAL DATA:  Altered mental status, recent ankle trauma. Lethargic. Evaluate for cerebral infarction.  EXAM: MRI HEAD WITHOUT CONTRAST  MRA HEAD WITHOUT CONTRAST  TECHNIQUE: Multiplanar, multiecho pulse sequences of the brain and surrounding structures were obtained without intravenous contrast. Angiographic images of the head were obtained using MRA technique without contrast.  COMPARISON:  CT head earlier today.  FINDINGS: MRI HEAD FINDINGS  No evidence for acute infarction, hemorrhage, mass lesion, hydrocephalus, or extra-axial fluid. Mild chronic microvascular ischemic change. No chronic hemorrhage. Bilateral cataract extraction. Negative osseous structures. No sinus or mastoid disease. Compared with CT earlier today, the appearance is secondary to atrophy, asymmetry of the patient in the gantry, and motion.  MRA HEAD FINDINGS  The internal carotid arteries are widely patent. The basilar artery is widely patent. Both vertebrals contribute to its formation. There is no intracranial stenosis or aneurysm.  IMPRESSION: MRI HEAD IMPRESSION  No acute intracranial abnormality. Atrophy and small vessel disease.  MRA HEAD IMPRESSION  Unremarkable MRA intracranial.   Electronically Signed   By: Davonna Belling M.D.   On: 08/30/2013 17:42   Mr Brain Wo Contrast  08/30/2013   CLINICAL DATA:  Altered mental status, recent ankle trauma. Lethargic. Evaluate for cerebral infarction.  EXAM: MRI HEAD WITHOUT CONTRAST  MRA HEAD WITHOUT CONTRAST  TECHNIQUE: Multiplanar, multiecho pulse  sequences of the brain and surrounding structures were obtained without intravenous contrast. Angiographic images of the head were obtained using MRA technique without contrast.  COMPARISON:  CT head earlier today.  FINDINGS: MRI HEAD FINDINGS  No evidence for acute infarction, hemorrhage, mass lesion, hydrocephalus, or extra-axial fluid. Mild chronic microvascular ischemic change. No chronic hemorrhage. Bilateral cataract extraction. Negative osseous structures. No sinus or mastoid disease. Compared with CT  earlier today, the appearance is secondary to atrophy, asymmetry of the patient in the gantry, and motion.  MRA HEAD FINDINGS  The internal carotid arteries are widely patent. The basilar artery is widely patent. Both vertebrals contribute to its formation. There is no intracranial stenosis or aneurysm.  IMPRESSION: MRI HEAD IMPRESSION  No acute intracranial abnormality. Atrophy and small vessel disease.  MRA HEAD IMPRESSION  Unremarkable MRA intracranial.   Electronically Signed   By: Davonna Belling M.D.   On: 08/30/2013 17:42    Assessment/Plan  67 yo female with ams/delirium of unclear etiology suspicious for narcotic abuse Principal Problem:   Acute delirium Active Problems:   ABUSE, ALCOHOL, IN REMISSION   NARCOTIC ABUSE   HYPERTENSION   UTI (urinary tract infection)   Chronic diastolic CHF (congestive heart failure)   Seizure disorder   Nocturnal hypoxia   Polysubstance abuse   DM (diabetes mellitus), type 2, uncontrolled  uds and etoh are negative but pt responded very well to narcan.  obs overnight.  vss and oxygen sats are good.  No fever.  Has uti, cover with cipro.  Ck cxr also.  Neuro cks overnight.  obs on tele.  Full code.    Ana Liaw A 08/30/2013, 8:18 PM

## 2013-08-30 NOTE — ED Provider Notes (Signed)
CSN: [id number]     Arrival date & time 08/30/13  1502 History   First MD Initiated Contact with Patient 08/30/13 1508     Chief Complaint  Patient presents with  . Altered Mental Status   (Consider location/radiation/quality/duration/timing/severity/associated sxs/prior Treatment) Patient is a 67 y.o. female presenting with altered mental status. The history is provided by the patient, the EMS personnel and medical records. No language interpreter was used.  Altered Mental Status Presenting symptoms: lethargy   Severity:  Moderate Most recent episode:  Today Episode history:  Single Duration: unknown. Timing:  Constant Progression:  Unchanged Chronicity:  New  Javonne Louissaint Maves is a 67 y.o. female  with a hx of polysubstance abuse, anxiety (no longer on benzos), IDDM, depression, COPD, polysubstance abuse presents to the Emergency Department complaining via EMS lethargic.  Family reports to EMS that she is acting the same way as her last Benzo OD, but husband reports he keeps her medications locked up.  Pt is alone at this time. She is arousable to voice and reply's yes when asked if she took pain medication this morning, but answers no further questions.  Level 5 caveat for AMS.    Past Medical History  Diagnosis Date  . Multiple falls   . Orthostatic hypotension 05/17/12  . HYPERTENSION     hx. of; no longer on med. due to orthostatic hypotension  . ANXIETY   . Chronic diastolic heart failure   . DIABETES MELLITUS, TYPE II 2001    IDDM  . Carpal tunnel syndrome of right wrist 01/2013  . 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 Diabetic neuropathy     feet  . Hyperlipidemia   . History of substance abuse     Etoh and narcotics  . Diverticulosis   . DEPRESSION   . GERD   . SEIZURE DISORDER     no seizures in 3 yrs.  . Wears dentures     upper  . Sleep apnea 05/26/2012    sleep study:  AHI 7.5; RDI 10.7; CPAP not recommended  . Parasomnia in conditions classified  elsewhere   . COPD (chronic obstructive pulmonary disease)     on home O2 at night, 2l/min.; denies SOB with daily activities   Past Surgical History  Procedure Laterality Date  . Hemorrhoid surgery    . Wrist fracture surgery Right 02/2012  . Appendectomy  1970's  . Vaginal hysterectomy    . Cataract extraction w/ intraocular lens  implant, bilateral Bilateral 2012  . Cholecystectomy  10/26/2000    lap. chole.  . Shoulder arthroscopy w/ subacromial decompression and distal clavicle excision Left 12/28/2011  . Hardware removal Right 02/21/2013    Procedure: REMOVAL DVR PLATE;  Surgeon: Nicki Reaper, MD;  Location: [facility];  Service: Orthopedics;  Laterality: Right;  . Carpal tunnel release Right 02/21/2013    Procedure: CARPAL TUNNEL RELEASE;  Surgeon: Nicki Reaper, MD;  Location: [facility];  Service: Orthopedics;  Laterality: Right;  . Wrist osteotomy Right 02/21/2013    Procedure: OSTEOTOMY ULNAR HEAD;  Surgeon: Nicki Reaper, MD;  Location:  SURGERY CENTER;  Service: Orthopedics;  Laterality: Right;   Family History  Problem Relation Age of Onset  . Coronary artery disease Father    History  Substance Use Topics  . Smoking status: Current Every Day Smoker -- 30 years    Types: Cigarettes  . Smokeless tobacco: Never Used  Comment: 3 cig./day  . Alcohol Use: No     Comment: former hx. of Etoh abuse   OB History   Grav Para Term Preterm Abortions TAB SAB Ect Mult Living                 Review of Systems  Unable to perform ROS: Mental status change    Allergies  Penicillins and Tegretol  Home Medications   Current Outpatient Rx  Name  Route  Sig  Dispense  Refill  . acetaminophen (TYLENOL) 325 MG tablet   Oral   Take 2 tablets (650 mg total) by mouth every 6 (six) hours as needed.         Marland Kitchen atorvastatin (LIPITOR) 10 MG tablet   Oral   Take 10 mg by mouth daily.         Marland Kitchen CALCIUM-VITAMIN D PO   Oral   Take 1 tablet by  mouth daily with breakfast. Calcium 1200- Vitamin D1000 mg.         . clonazePAM (KLONOPIN) 0.25 MG disintegrating tablet   Oral   Take 1 tablet (0.25 mg total) by mouth at bedtime as needed.   60 tablet   0   . estrogens, conjugated, (PREMARIN) 0.625 MG tablet   Oral   Take 0.625 mg by mouth daily.         . insulin glargine (LANTUS) 100 UNIT/ML injection   Subcutaneous   Inject 26 Units into the skin at bedtime.          . levETIRAcetam (KEPPRA XR) 500 MG 24 hr tablet   Oral   Take 500 mg by mouth 2 (two) times daily.         . Liraglutide 18 MG/3ML SOPN   Subcutaneous   Inject into the skin. Inject 1.8 mg into the skin daily         . Magnesium 250 MG TABS   Oral   Take 250 mg by mouth daily with breakfast.         . metFORMIN (GLUCOPHAGE-XR) 500 MG 24 hr tablet   Oral   Take 1,500 mg by mouth daily at 12 noon.          . Multiple Vitamins-Minerals (CENTRUM SILVER PO)   Oral   Take 1 tablet by mouth daily.         Marland Kitchen omeprazole (PRILOSEC) 20 MG capsule   Oral   Take 20 mg by mouth Daily.          Marland Kitchen OVER THE COUNTER MEDICATION   Oral   Take 1 tablet by mouth as needed (allergies). Wal-itin. (Allergy Relief)         . pregabalin (LYRICA) 300 MG capsule   Oral   Take 2 capsules (600 mg total) by mouth at bedtime.   180 capsule   1     Fax to [employer] [facility] at [phone]   . pyridostigmine (MESTINON) 60 MG tablet   Oral   Take 60 mg by mouth 2 (two) times daily.         . QUEtiapine (SEROQUEL) 300 MG tablet   Oral   Take 300 mg by mouth at bedtime.         Marland Kitchen venlafaxine (EFFEXOR XR) 150 MG 24 hr capsule   Oral   Take 150 mg by mouth daily.           BP 131/79  Pulse 105  Temp(Src) 98.9 F (37.2 C) (Oral)  Resp  20  SpO2 97% Physical Exam  Nursing note and vitals reviewed. Constitutional: She appears well-developed and well-nourished. No distress.  Lethargic, arousalable to loud voice   HENT:  Head: Normocephalic and  atraumatic.  Mouth/Throat: Oropharynx is clear and moist. No oropharyngeal exudate.  Eyes: Conjunctivae are normal. Pupils are equal, round, and reactive to light. No scleral icterus.  Neck: Neck supple.  Patent airway, no stridor, handling secretions  Cardiovascular: Regular rhythm, normal heart sounds and intact distal pulses.   No murmur heard. Tachycardic Capillary refill< 3  No cyanosis  Pulmonary/Chest: Effort normal and breath sounds normal. No respiratory distress. She has no wheezes.  Abdominal: Soft. Bowel sounds are normal. She exhibits no mass. There is no tenderness. There is no rebound and no guarding.  Musculoskeletal: Normal range of motion. She exhibits no edema.  L ankle ecchymosis and swelling  Lymphadenopathy:    She has no cervical adenopathy.  Neurological: GCS eye subscore is 3. GCS verbal subscore is 4. GCS motor subscore is 5.  Pt arousable to voice and follows one basic command at a time before falling back asleep  Skin: Skin is warm and dry. No rash noted. She is not diaphoretic. No erythema.  Psychiatric: She has a normal mood and affect.    ED Course  Procedures (including critical care time) Labs Review Labs Reviewed  CBC - Abnormal; Notable for the following:    WBC 16.2 (*)    All other components within normal limits  BASIC METABOLIC PANEL - Abnormal; Notable for the following:    Sodium 131 (*)    Glucose, Bld 259 (*)    GFR calc non Af Amer 84 (*)    All other components within normal limits  URINALYSIS, ROUTINE W REFLEX MICROSCOPIC - Abnormal; Notable for the following:    Color, Urine AMBER (*)    APPearance CLOUDY (*)    Glucose, UA 250 (*)    Hgb urine dipstick MODERATE (*)    Ketones, ur 15 (*)    Protein, ur 30 (*)    Nitrite POSITIVE (*)    All other components within normal limits  GLUCOSE, CAPILLARY - Abnormal; Notable for the following:    Glucose-Capillary 279 (*)    All other components within normal limits  HEPATIC FUNCTION  PANEL - Abnormal; Notable for the following:    Albumin 3.3 (*)    AST 358 (*)    ALT 150 (*)    All other components within normal limits  DIFFERENTIAL - Abnormal; Notable for the following:    Neutro Abs 11.3 (*)    Monocytes Absolute 1.3 (*)    All other components within normal limits  URINE MICROSCOPIC-ADD ON - Abnormal; Notable for the following:    Bacteria, UA FEW (*)    Casts HYALINE CASTS (*)    All other components within normal limits  URINE CULTURE  URINE RAPID DRUG SCREEN (HOSP PERFORMED)  ETHANOL  PROTIME-INR  APTT  TROPONIN I  ACETAMINOPHEN LEVEL  POCT I-STAT TROPONIN I   Imaging Review Dg Ankle Complete Left  08/30/2013   CLINICAL DATA:  Fall, twisted ankle with lateral pain and swelling  EXAM: LEFT ANKLE COMPLETE - 3+ VIEW  COMPARISON:  None.  FINDINGS: A small avulsion fracture at the distal tip of the lateral malleolus with associated overlying soft tissue swelling. Plantar calcaneal spurring. No ankle joint effusion. The remainder of the visualized bones and joints are intact and unremarkable.  IMPRESSION: Small avulsion fracture at the  distal tip of the lateral malleolus.   Electronically Signed   By: Malachy Moan M.D.   On: 08/30/2013 01:58   Ct Head Wo Contrast  08/30/2013   CLINICAL DATA:  Altered mental status.  EXAM: CT HEAD WITHOUT CONTRAST  TECHNIQUE: Contiguous axial images were obtained from the base of the skull through the vertex without intravenous contrast.  COMPARISON:  05/18/2013.  FINDINGS: Interval ill-defined areas of low density involving the gray and white matter in the left frontoparietal region and right parietal occipital region. No intracranial hemorrhage or mass lesion. Mild patchy white matter low density in both frontal lobes. Diffusely enlarged ventricles and subarachnoid spaces with little change. Unremarkable bones and included paranasal sinuses.  IMPRESSION: 1. Findings suspicious for acute bilateral cerebral infarctions, as  described above. Further evaluation with MRI and possibly MRA is recommended. 2. Atrophy and mild chronic small vessel white matter ischemic changes.  These results were called by telephone at the time of interpretation on 08/30/2013 at 3:56 PM to [facility] , who verbally acknowledged these results.   Electronically Signed   By: Gordan Payment M.D.   On: 08/30/2013 15:55   Mr Angiogram Head Wo Contrast  08/30/2013   CLINICAL DATA:  Altered mental status, recent ankle trauma. Lethargic. Evaluate for cerebral infarction.  EXAM: MRI HEAD WITHOUT CONTRAST  MRA HEAD WITHOUT CONTRAST  TECHNIQUE: Multiplanar, multiecho pulse sequences of the brain and surrounding structures were obtained without intravenous contrast. Angiographic images of the head were obtained using MRA technique without contrast.  COMPARISON:  CT head earlier today.  FINDINGS: MRI HEAD FINDINGS  No evidence for acute infarction, hemorrhage, mass lesion, hydrocephalus, or extra-axial fluid. Mild chronic microvascular ischemic change. No chronic hemorrhage. Bilateral cataract extraction. Negative osseous structures. No sinus or mastoid disease. Compared with CT earlier today, the appearance is secondary to atrophy, asymmetry of the patient in the gantry, and motion.  MRA HEAD FINDINGS  The internal carotid arteries are widely patent. The basilar artery is widely patent. Both vertebrals contribute to its formation. There is no intracranial stenosis or aneurysm.  IMPRESSION: MRI HEAD IMPRESSION  No acute intracranial abnormality. Atrophy and small vessel disease.  MRA HEAD IMPRESSION  Unremarkable MRA intracranial.   Electronically Signed   By: Davonna Belling M.D.   On: 08/30/2013 17:42   Mr Brain Wo Contrast  08/30/2013   CLINICAL DATA:  Altered mental status, recent ankle trauma. Lethargic. Evaluate for cerebral infarction.  EXAM: MRI HEAD WITHOUT CONTRAST  MRA HEAD WITHOUT CONTRAST  TECHNIQUE: Multiplanar, multiecho pulse sequences of the brain  and surrounding structures were obtained without intravenous contrast. Angiographic images of the head were obtained using MRA technique without contrast.  COMPARISON:  CT head earlier today.  FINDINGS: MRI HEAD FINDINGS  No evidence for acute infarction, hemorrhage, mass lesion, hydrocephalus, or extra-axial fluid. Mild chronic microvascular ischemic change. No chronic hemorrhage. Bilateral cataract extraction. Negative osseous structures. No sinus or mastoid disease. Compared with CT earlier today, the appearance is secondary to atrophy, asymmetry of the patient in the gantry, and motion.  MRA HEAD FINDINGS  The internal carotid arteries are widely patent. The basilar artery is widely patent. Both vertebrals contribute to its formation. There is no intracranial stenosis or aneurysm.  IMPRESSION: MRI HEAD IMPRESSION  No acute intracranial abnormality. Atrophy and small vessel disease.  MRA HEAD IMPRESSION  Unremarkable MRA intracranial.   Electronically Signed   By: Davonna Belling M.D.   On: 08/30/2013 17:42    EKG  Interpretation   None      ECG:  Date: 08/30/2013  Rate: 114  Rhythm: sinus tachycardia  QRS Axis: normal  Intervals: normal  ST/T Wave abnormalities: normal  Conduction Disutrbances:none  Narrative Interpretation: sinus tachycardia, unchanged from 05/18/13  Old EKG Reviewed: changes noted   MDM   1. Lethargy   2. Polysubstance abuse   3. DM (diabetes mellitus), type 2, uncontrolled   4. UTI (urinary tract infection)      DELMY HOLDREN presents with [facility]; constricted pupils suspicious for narcotic overdose and pt has a hx of substance abuse.  Will give narcan, check labs and obtain head CT.  4:33 PM Pt alert and oriented, following commands after narcan administration.  She reports she is at baseline.  CT findings suspicious for acute bilateral cerebral infarctions.  Will obtain MRI/MRA brain.  No focal neurologic deficits including no facial droop, no focal weakness, no  sensation deficits no slurred speech.  Anion gap 11; no evidence of DKA.    7:19 PM Rechecked patient several times for which she's been alert and oriented. At this time patient was again lethargic but arousable to voice. Redosed Narcan with restoration of normal mental status. Patient's UDS negative for opiates, ethanol negative.  Acetaminophen  unknown ingestion, question methadone.  Urinalysis with positive nitrites in 7-10 white blood cells, will treat with Cipro. Patient remains without neurologic deficit. MRI without evidence of acute stroke. Patient also remains tachycardic. Will admit for overnight observation.      Dierdre Forth, PA-C 08/30/13 1946

## 2013-08-30 NOTE — ED Notes (Signed)
Neuro checks performed Patient able to answer orientation questions appropriately, but patient slow to answer Patient states that she feels "drugged." IV abx given, see MAR Patient denies c/o pain or needs at this time Side rails up, call bell in reach

## 2013-08-30 NOTE — Progress Notes (Signed)
Received report on this patient coming to room 1441 from [place], [place] in the ED. Said patient would arrive to floor in a few minutes.

## 2013-08-30 NOTE — ED Notes (Signed)
Per EMS- Patient was discharged from [facility] this AM with an avulsion fracture, left ankle. Patient was found by the husband this AM to be very lethargic. Patient opens her eyes to verbal and physical stimuli at times, but does not respond verbally. Patient's husband states he was not home much when patient was discharged from the ED today. Patient did not have her left foot brace on when EMS arrived.

## 2013-08-30 NOTE — ED Notes (Signed)
Bed: WA17 Expected date:  Expected time:  Means of arrival:  Comments: EMS AMS 

## 2013-08-30 NOTE — ED Provider Notes (Signed)
CSN: [id number]     Arrival date & time 08/30/13  0054 History   First MD Initiated Contact with Patient 08/30/13 0247     Chief Complaint  Patient presents with  . Ankle Pain   (Consider location/radiation/quality/duration/timing/severity/associated sxs/prior Treatment) HPI Patient presents approximately 20 hours after a fall from her bed, with persistent pain in her left ankle.  Patient notes that the pain is focally about the lateral ankle, with radiation down the posterior of her leg.  Pain is worse with weightbearing or motion. No other significant pain following a fall.  She has been ambulatory. Minimal relief with OTC medication.  Past Medical History  Diagnosis Date  . Multiple falls   . Orthostatic hypotension 05/17/12  . HYPERTENSION     hx. of; no longer on med. due to orthostatic hypotension  . ANXIETY   . Chronic diastolic heart failure   . DIABETES MELLITUS, TYPE II 2001    IDDM  . Carpal tunnel syndrome of right wrist 01/2013  . 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 Diabetic neuropathy     feet  . Hyperlipidemia   . History of substance abuse     Etoh and narcotics  . Diverticulosis   . DEPRESSION   . GERD   . SEIZURE DISORDER     no seizures in 3 yrs.  . Wears dentures     upper  . Sleep apnea 05/26/2012    sleep study:  AHI 7.5; RDI 10.7; CPAP not recommended  . 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 COPD (chronic obstructive pulmonary disease)     on home O2 at night, 2l/min.; denies SOB with daily activities   Past Surgical History  Procedure Laterality Date  . Hemorrhoid surgery    . Wrist fracture surgery Right 02/2012  . Appendectomy  1970's  . Vaginal hysterectomy    . Cataract extraction w/ intraocular lens  implant, bilateral Bilateral 2012  . Cholecystectomy  10/26/2000    lap. chole.  . Shoulder arthroscopy w/ subacromial decompression and distal clavicle excision Left 12/28/2011  . Hardware removal Right 02/21/2013     Procedure: REMOVAL DVR PLATE;  Surgeon: Nicki Reaper, MD;  Location: [facility];  Service: Orthopedics;  Laterality: Right;  . Carpal tunnel release Right 02/21/2013    Procedure: CARPAL TUNNEL RELEASE;  Surgeon: Nicki Reaper, MD;  Location: [facility];  Service: Orthopedics;  Laterality: Right;  . Wrist osteotomy Right 02/21/2013    Procedure: OSTEOTOMY ULNAR HEAD;  Surgeon: Nicki Reaper, MD;  Location:  SUR[facility]ERY CENTER;  Service: Orthopedics;  Laterality: Right;   Family History  Problem Relation Age of Onset  . Coronary artery disease Father    History  Substance Use Topics  . Smoking status: Current Every Day Smoker -- 30 years    Types: Cigarettes  . Smokeless tobacco: Never Used     Comment: 3 cig./day  . Alcohol Use: No     Comment: former hx. of Etoh abuse   OB History   Grav Para Term Preterm Abortions TAB SAB Ect Mult Living                 Review of Systems  All other systems reviewed and are negative.    Allergies  Penicillins and Tegretol  Home Medications   Current Outpatient Rx  Name  Route  Sig  Dispense  Refill  . acetaminophen (TYLENOL) 325 MG tablet  Oral   Take 2 tablets (650 mg total) by mouth every 6 (six) hours as needed.         Marland Kitchen atorvastatin (LIPITOR) 10 MG tablet   Oral   Take 10 mg by mouth daily.         Marland Kitchen CALCIUM-VITAMIN D PO   Oral   Take 1 tablet by mouth daily with breakfast. Calcium 1200- Vitamin D1000 mg.         . clonazePAM (KLONOPIN) 0.25 MG disintegrating tablet   Oral   Take 1 tablet (0.25 mg total) by mouth at bedtime as needed.   60 tablet   0   . estrogens, conjugated, (PREMARIN) 0.625 MG tablet   Oral   Take 0.625 mg by mouth daily.         . insulin glargine (LANTUS) 100 UNIT/ML injection   Subcutaneous   Inject 26 Units into the skin at bedtime.          . levETIRAcetam (KEPPRA XR) 500 MG 24 hr tablet   Oral   Take 500 mg by mouth 2 (two) times daily.          . Liraglutide 18 MG/3ML SOPN   Subcutaneous   Inject into the skin. Inject 1.8 mg into the skin daily         . Magnesium 250 MG TABS   Oral   Take 250 mg by mouth daily with breakfast.         . metFORMIN (GLUCOPHAGE-XR) 500 MG 24 hr tablet   Oral   Take 1,500 mg by mouth daily at 12 noon.          . Multiple Vitamins-Minerals (CENTRUM SILVER PO)   Oral   Take 1 tablet by mouth daily.         Marland Kitchen omeprazole (PRILOSEC) 20 MG capsule   Oral   Take 20 mg by mouth Daily.          Marland Kitchen OVER THE COUNTER MEDICATION   Oral   Take 1 tablet by mouth as needed (allergies). Wal-itin. (Allergy Relief)         . pregabalin (LYRICA) 300 MG capsule   Oral   Take 2 capsules (600 mg total) by mouth at bedtime.   180 capsule   1     Fax to [employer] [facility] at [phone]   . pyridostigmine (MESTINON) 60 MG tablet   Oral   Take 60 mg by mouth 2 (two) times daily.         . QUEtiapine (SEROQUEL) 300 MG tablet   Oral   Take 300 mg by mouth at bedtime.         Marland Kitchen venlafaxine (EFFEXOR XR) 150 MG 24 hr capsule   Oral   Take 150 mg by mouth daily.           BP 141/63  Pulse 85  Temp(Src) 98.1 F (36.7 C) (Oral)  Resp 18  SpO2 95% Physical Exam  Nursing note and vitals reviewed. Constitutional: She is oriented to person, place, and time. She appears well-developed and well-nourished. No distress.  HENT:  Head: Normocephalic and atraumatic.  Eyes: Conjunctivae and EOM are normal.  Cardiovascular: Normal rate, regular rhythm and intact distal pulses.   Pulmonary/Chest: No stridor. No respiratory distress.  Musculoskeletal: She exhibits no edema.  The left hip is unremarkable, the left knee is unremarkable, there is minor tenderness about the hamstring, with hip flexion. The left ankle is edematous laterally, with tenderness to  palpation about the lateral malleolus.  Patient can flex and extend the ankle and all toes.  Neurological: She is alert and oriented to person,  place, and time. No cranial nerve deficit.  Skin: Skin is warm and dry.  Psychiatric: She has a normal mood and affect.    ED Course  ORTHOPEDIC INJURY TREATMENT Date/Time: 08/30/2013 3:25 AM Performed by: Gerhard Munch Authorized by: Gerhard Munch Consent: Verbal consent obtained. Risks and benefits: risks, benefits and alternatives were discussed Consent given by: patient Patient understanding: patient states understanding of the procedure being performed Patient consent: the patient's understanding of the procedure matches consent given Procedure consent: procedure consent matches procedure scheduled Relevant documents: relevant documents present and verified Test results: test results available and properly labeled Site marked: the operative site was marked Imaging studies: imaging studies available Required items: required blood products, implants, devices, and special equipment available Patient identity confirmed: verbally with patient Injury location: ankle Location details: left ankle Injury type: fracture Fracture type: lateral malleolus Pre-procedure neurovascular assessment: neurovascularly intact Pre-procedure distal perfusion: normal Pre-procedure neurological function: normal Pre-procedure range of motion: reduced Local anesthesia used: no Patient sedated: no Manipulation performed: no Immobilization: brace Splint type: short leg Supplies used: aluminum splint Post-procedure neurovascular assessment: post-procedure neurovascularly intact Post-procedure distal perfusion: normal Post-procedure neurological function: normal Post-procedure range of motion: unchanged Patient tolerance: Patient tolerated the procedure well with no immediate complications.   (including critical care time) Labs Review Labs Reviewed - No data to display Imaging Review Dg Ankle Complete Left  08/30/2013   CLINICAL DATA:  Fall, twisted ankle with lateral pain and swelling   EXAM: LEFT ANKLE COMPLETE - 3+ VIEW  COMPARISON:  None.  FINDINGS: A small avulsion fracture at the distal tip of the lateral malleolus with associated overlying soft tissue swelling. Plantar calcaneal spurring. No ankle joint effusion. The remainder of the visualized bones and joints are intact and unremarkable.  IMPRESSION: Small avulsion fracture at the distal tip of the lateral malleolus.   Electronically Signed   By: Malachy Moan M.D.   On: 08/30/2013 01:58    MDM   1. Avulsion fracture of ankle, left, closed, initial encounter    Patient presents with pain in her lateral malleolus of the left ankle following minor fall, almost one day ago.  On exam she is neurovascularly intact, but there is evidence for an avulsion fracture.  The patient had a splint applied, without complication, was discharged in stable condition.    Gerhard Munch, MD 08/30/13 [phone]

## 2013-08-31 ENCOUNTER — Observation Stay (HOSPITAL_COMMUNITY): Payer: Medicare Other

## 2013-08-31 ENCOUNTER — Telehealth: Payer: Self-pay | Admitting: Neurology

## 2013-08-31 DIAGNOSIS — R404 Transient alteration of awareness: Secondary | ICD-10-CM

## 2013-08-31 DIAGNOSIS — I5033 Acute on chronic diastolic (congestive) heart failure: Secondary | ICD-10-CM

## 2013-08-31 DIAGNOSIS — J449 Chronic obstructive pulmonary disease, unspecified: Secondary | ICD-10-CM

## 2013-08-31 DIAGNOSIS — F411 Generalized anxiety disorder: Secondary | ICD-10-CM

## 2013-08-31 DIAGNOSIS — F29 Unspecified psychosis not due to a substance or known physiological condition: Secondary | ICD-10-CM

## 2013-08-31 LAB — COMPREHENSIVE METABOLIC PANEL
AST: 266 U/L — ABNORMAL HIGH (ref 0–37)
Albumin: 2.9 g/dL — ABNORMAL LOW (ref 3.5–5.2)
Alkaline Phosphatase: 92 U/L (ref 39–117)
BUN: 11 mg/dL (ref 6–23)
CO2: 28 mEq/L (ref 19–32)
Chloride: 99 mEq/L (ref 96–112)
Creatinine, Ser: 0.6 mg/dL (ref 0.50–1.10)
GFR calc non Af Amer: >90 mL/min (ref >90)
Glucose, Bld: 166 mg/dL — ABNORMAL HIGH (ref 70–99)
Potassium: 4.2 mEq/L (ref 3.5–5.1)
Total Bilirubin: 0.5 mg/dL (ref 0.3–1.2)

## 2013-08-31 LAB — CBC
HCT: 34.5 % — ABNORMAL LOW (ref 36.0–46.0)
Hemoglobin: 11 g/dL — ABNORMAL LOW (ref 12.0–15.0)
MCH: 26.4 pg (ref 26.0–34.0)
MCV: 82.9 fL (ref 78.0–100.0)
Platelets: 302 10*3/uL (ref 150–400)
RBC: 4.16 MIL/uL (ref 3.87–5.11)
RDW: 14.5 % (ref 11.5–15.5)
WBC: 14.1 10*3/uL — ABNORMAL HIGH (ref 4.0–10.5)

## 2013-08-31 LAB — GLUCOSE, CAPILLARY
Glucose-Capillary: 210 mg/dL — ABNORMAL HIGH (ref 70–99)
Glucose-Capillary: 209 mg/dL — ABNORMAL HIGH (ref 70–99)

## 2013-08-31 LAB — URINE CULTURE: Culture: NO GROWTH

## 2013-08-31 MED ORDER — ESTROGENS CONJUGATED 0.625 MG PO TABS
0.6250 mg | ORAL_TABLET | Freq: Every day | ORAL | Status: DC
  Administered 2013-08-31 – 2013-09-01 (×2): 0.625 mg via ORAL
  Filled 2013-08-31 (×2): qty 1

## 2013-08-31 MED ORDER — PYRIDOSTIGMINE BROMIDE 60 MG PO TABS
60.0000 mg | ORAL_TABLET | Freq: Two times a day (BID) | ORAL | Status: DC
  Administered 2013-08-31 (×2): 60 mg via ORAL
  Filled 2013-08-31 (×5): qty 1

## 2013-08-31 MED ORDER — LEVETIRACETAM ER 500 MG PO TB24
500.0000 mg | ORAL_TABLET | Freq: Two times a day (BID) | ORAL | Status: DC
  Administered 2013-08-31 – 2013-09-01 (×3): 500 mg via ORAL
  Filled 2013-08-31 (×4): qty 1

## 2013-08-31 MED ORDER — NICOTINE 14 MG/24HR TD PT24
14.0000 mg | MEDICATED_PATCH | Freq: Every day | TRANSDERMAL | Status: DC
  Administered 2013-08-31 – 2013-09-01 (×2): 14 mg via TRANSDERMAL
  Filled 2013-08-31 (×2): qty 1

## 2013-08-31 MED ORDER — FUROSEMIDE 20 MG PO TABS
20.0000 mg | ORAL_TABLET | Freq: Every day | ORAL | Status: DC
  Administered 2013-08-31: 20 mg via ORAL
  Filled 2013-08-31: qty 1

## 2013-08-31 MED ORDER — HALOPERIDOL LACTATE 5 MG/ML IJ SOLN
1.0000 mg | Freq: Once | INTRAMUSCULAR | Status: AC
  Administered 2013-08-31: 1 mg via INTRAMUSCULAR

## 2013-08-31 MED ORDER — FUROSEMIDE 40 MG PO TABS
40.0000 mg | ORAL_TABLET | Freq: Every day | ORAL | Status: DC
  Administered 2013-09-01: 40 mg via ORAL
  Filled 2013-08-31: qty 1

## 2013-08-31 MED ORDER — LORAZEPAM 0.5 MG PO TABS
0.5000 mg | ORAL_TABLET | Freq: Four times a day (QID) | ORAL | Status: DC | PRN
  Administered 2013-08-31: 0.5 mg via ORAL
  Filled 2013-08-31 (×2): qty 1

## 2013-08-31 MED ORDER — IPRATROPIUM BROMIDE 0.02 % IN SOLN
0.5000 mg | Freq: Three times a day (TID) | RESPIRATORY_TRACT | Status: DC
  Administered 2013-08-31 – 2013-09-01 (×2): 0.5 mg via RESPIRATORY_TRACT
  Filled 2013-08-31 (×2): qty 2.5

## 2013-08-31 MED ORDER — ATORVASTATIN CALCIUM 10 MG PO TABS
10.0000 mg | ORAL_TABLET | Freq: Every day | ORAL | Status: DC
  Filled 2013-08-31 (×2): qty 1

## 2013-08-31 MED ORDER — HALOPERIDOL LACTATE 5 MG/ML IJ SOLN: 1.0000 mg | Freq: Four times a day (QID) | INTRAMUSCULAR | Status: DC | PRN

## 2013-08-31 MED ORDER — QUETIAPINE FUMARATE 300 MG PO TABS
300.0000 mg | ORAL_TABLET | Freq: Every day | ORAL | Status: DC
  Administered 2013-08-31: 300 mg via ORAL
  Filled 2013-08-31 (×4): qty 1

## 2013-08-31 MED ORDER — PREGABALIN 75 MG PO CAPS
300.0000 mg | ORAL_CAPSULE | Freq: Every day | ORAL | Status: DC
  Administered 2013-08-31: 300 mg via ORAL
  Filled 2013-08-31: qty 4

## 2013-08-31 MED ORDER — CLONAZEPAM 0.5 MG PO TABS
0.2500 mg | ORAL_TABLET | Freq: Every day | ORAL | Status: DC
  Administered 2013-08-31: 0.25 mg via ORAL
  Filled 2013-08-31: qty 1

## 2013-08-31 MED ORDER — HALOPERIDOL LACTATE 5 MG/ML IJ SOLN
INTRAMUSCULAR | Status: AC
  Administered 2013-08-31: 1 mg via INTRAMUSCULAR
  Filled 2013-08-31: qty 1

## 2013-08-31 MED ORDER — HALOPERIDOL LACTATE 5 MG/ML IJ SOLN: 2.0000 mg | Freq: Four times a day (QID) | INTRAMUSCULAR | Status: DC | PRN

## 2013-08-31 MED ORDER — ALBUTEROL SULFATE (5 MG/ML) 0.5% IN NEBU
2.5000 mg | INHALATION_SOLUTION | Freq: Three times a day (TID) | RESPIRATORY_TRACT | Status: DC
  Administered 2013-08-31 – 2013-09-01 (×2): 2.5 mg via RESPIRATORY_TRACT
  Filled 2013-08-31 (×2): qty 0.5

## 2013-08-31 MED ORDER — LORAZEPAM 2 MG/ML IJ SOLN
0.5000 mg | INTRAMUSCULAR | Status: DC | PRN
Start: 2013-08-31 — End: 2013-08-31
  Filled 2013-08-31: qty 1

## 2013-08-31 MED ORDER — PANTOPRAZOLE SODIUM 40 MG PO TBEC
40.0000 mg | DELAYED_RELEASE_TABLET | Freq: Every day | ORAL | Status: DC
  Administered 2013-08-31 – 2013-09-01 (×2): 40 mg via ORAL
  Filled 2013-08-31 (×2): qty 1

## 2013-08-31 NOTE — Progress Notes (Signed)
Shift event: RN paged NP earlier in shift secondary to pt being agitated and walking around room. Ordered Ativan to be given, but when RN approached pt she became aggressive and more agitated and pulled out her IV and walked down the hall. She was combative towards the RN. Security was called and pt was convinced to go back to bed. Haldol 1mg  IM given (Ativan had not been given). However, about 30-45 mins later, RN called again stating pt was back OOB and up in room, attempting to walk out again. NP to bedside.  Upon arrival, pt is standing at doorway of room with RNs. As soon as I spoke to pt, she began to cry. She agreed to go back to bed which she did. I told pt we just wanted her to be safe and being up caused risks of falling. When I told her we wanted her to get better so she could go home, she cried and said "he doesn't want me anymore" referring to her husband of 28 years.  She exhibits a swing in emotions, alternating in seconds to minutes. She was tearful then immediately became light and was joking and smiling. Then, reverted back to being sad and talking about her husband again. Offered Chaplain and she agreed. RN to call.  Perhaps, she needs a psych consult. Jimmye Norman, NP [facility]

## 2013-08-31 NOTE — Progress Notes (Signed)
Inpatient Diabetes Program Recommendations  AACE/ADA: New Consensus Statement on Inpatient Glycemic Control (2013)  Target Ranges:  Prepandial:   less than 140 mg/dL      Peak postprandial:   less than 180 mg/dL (1-2 hours)      Critically ill patients:  140 - 180 mg/dL   Reason for Visit: Hyperglycemia Results for Joyce Richardson, Joyce Richardson (MRN [id number]) as of 08/31/2013 11:33  Ref. Range 08/30/2013 15:20 08/30/2013 22:39 08/31/2013 07:42  Glucose-Capillary Latest Range: 70-99 mg/dL 147 (H) 829 (H) 562 (H)     Inpatient Diabetes Program Recommendations Correction (SSI): Increase Novolog to moderate tidwc and hs Need updated HgbA1C to assess glycemic control prior to hospitalization  Will continue to follow while inpatient. Thank you. Ailene Ards, RD, LDN, CDE Inpatient Diabetes Coordinator [phone]

## 2013-08-31 NOTE — Progress Notes (Signed)
Follow up per on call chaplain's request. Spouse in room with patient. Checked on staff.

## 2013-08-31 NOTE — Progress Notes (Signed)
Patient was calm in bed.  She woke as Chaplain stood in the doorway.  Don't think she was that deep in sleep.  She claimed some disjointed thoughts due to medication.  She had some difficulty completing thoughts.  Apparently had been given Ativan (poer nurse's report) shortly before Chaplain arrival.  Patient was apparently difficult to handle (see NP's report: 08/31/2013 @ 5:56am) and agitated and hostile.  Of note was that she had stated she didn't think her husband liked her.  He has MS and was just released from the hospital (per nurse) on the same unit.    She politely and respectfully said she didn't have much to talk about and didn't want to talk anymore.   Conferred with nurse after visit.  What the nurse described and the chaplain observed were basically opposite poles of behavior.  Chaplain saw patient on her medication, however.  Rema Jasmine, Chaplain Pager: [phone]

## 2013-08-31 NOTE — Progress Notes (Signed)
Nutrition Brief Note  Patient identified on the Malnutrition Screening Tool (MST) Report. Pt reported that she was unsure if she had weight loss PTA. Per chart - pt with very stable weights x 1 year. Discussed oral intake with nurse tech - she reports that pt was complaining of hunger and ate at least 50% of breakfast this morning.  Wt Readings from Last 15 Encounters:  08/30/13 184 lb 8.4 oz (83.7 kg)  08/15/13 188 lb (85.276 kg)  06/28/13 177 lb (80.287 kg)  05/19/13 158 lb 11.7 oz (72 kg)  03/15/13 180 lb (81.647 kg)  02/22/13 179 lb (81.194 kg)  02/21/13 180 lb (81.647 kg)  02/21/13 180 lb (81.647 kg)  02/13/13 173 lb 6 oz (78.642 kg)  02/13/13 173 lb 6 oz (78.642 kg)  12/13/12 186 lb (84.369 kg)  11/20/12 179 lb (81.194 kg)  08/21/12 180 lb 12.8 oz (82.01 kg)  07/12/12 180 lb (81.647 kg)  07/10/12 180 lb (81.647 kg)    Body mass index is 35.43 kg/(m^2). Patient meets criteria for Obese Class II based on current BMI.   Current diet order is Carbohydrate Modified Medium, patient is consuming approximately 50% of meals at this time. Labs and medications reviewed.   No nutrition interventions warranted at this time. If nutrition issues arise, please consult RD.   Jarold Motto MS, RD, LDN Pager: [phone] After-hours pager: [phone]

## 2013-08-31 NOTE — Progress Notes (Signed)
The patient started the treatment and decided that she didn't want to do the treatment.  RT explained to the patient the importance of the treatment and she decided to try again. Patient was unable to finish the treatment completely due to her stating that she didn't want to do the treatment and that she would try later.  Advised the patient that RT would come back in 6 hours to try again for another treatment.

## 2013-08-31 NOTE — Consult Note (Signed)
[facility] Face-to-Face Psychiatry Consult   Reason for Consult:  Change in Mental status Referring Physician:  Dr. Pincus Badder is an 67 y.o. female.  Assessment: AXIS I:  Psychotic Disorder NOS AXIS II:  Deferred AXIS III:   Past Medical History  Diagnosis Date  . Multiple falls   . Orthostatic hypotension 05/17/12  . HYPERTENSION     hx. of; no longer on med. due to orthostatic hypotension  . ANXIETY   . Chronic diastolic heart failure   . DIABETES MELLITUS, TYPE II 2001    IDDM  . Carpal tunnel syndrome of right wrist 01/2013  . 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 Diabetic neuropathy     feet  . Hyperlipidemia   . History of substance abuse     Etoh and narcotics  . Diverticulosis   . DEPRESSION   . GERD   . SEIZURE DISORDER     no seizures in 3 yrs.  . Wears dentures     upper  . Sleep apnea 05/26/2012    sleep study:  AHI 7.5; RDI 10.7; CPAP not recommended  . 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 COPD (chronic obstructive pulmonary disease)     on home O2 at night, 2l/min.; denies SOB with daily activities   AXIS IV:  other psychosocial or environmental problems and problems related to social environment AXIS V:  31-40 impairment in reality testing  Plan:  Patient is very agitated and uncooperative today she is confused.  We will need more information  Subjective:   Joyce Richardson is a 67 y.o. female patient admitted with confusion.  HPI:  This is a 67 year old female who had a history of overdose in the past, alcohol abuse, questionable seizure disorder and polysubstance abuse.  Patient admitted because of confusion.  Husband suspect it was overdose however UDS is negative.  Patient is a poor historian.  She is uncooperative and easily irritable labile and agitated.  She appears very confused and did not answer the question of paranoia or any suicidal thoughts.  She is refusing intravenous antibiotic for possible UTI.  She does not  remember where she lives, does not remember her physician's name the due to member she has diabetes.  She knows she lives with her husband but don't know his name, address and phone number.  As per the staff patient has episodes of confusion in the past, has seen in neurology and recent MRI is negative.  Patient denies taking any overdose or any suicidal attempt.  As per staff recently given hypnotic by primary care physician but unclear if that has given confusion.  Patient has been taking psychotropic medication which is Seroquel and venlafaxine.  They were held last night and now resume this morning.  Patient denies any hallucination but appears labile irritable and agitated and uncooperative.  Husband is not present in the room. HPI Elements:   Location:  Medical floor. Quality:  poor. Severity:  Moderate.  Past Psychiatric History: Past Medical History  Diagnosis Date  . Multiple falls   . Orthostatic hypotension 05/17/12  . HYPERTENSION     hx. of; no longer on med. due to orthostatic hypotension  . ANXIETY   . Chronic diastolic heart failure   . DIABETES MELLITUS, TYPE II 2001    IDDM  . Carpal tunnel syndrome of right wrist 01/2013  . 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 Diabetic neuropathy  feet  . Hyperlipidemia   . History of substance abuse     Etoh and narcotics  . Diverticulosis   . DEPRESSION   . GERD   . SEIZURE DISORDER     no seizures in 3 yrs.  . Wears dentures     upper  . Sleep apnea 05/26/2012    sleep study:  AHI 7.5; RDI 10.7; CPAP not recommended  . 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 COPD (chronic obstructive pulmonary disease)     on home O2 at night, 2l/min.; denies SOB with daily activities    reports that she has been smoking Cigarettes.  She has been smoking about 0.00 packs per day for the past 30 years. She has never used smokeless tobacco. She reports that she does not drink alcohol or use illicit drugs. Family History   Problem Relation Age of Onset  . Coronary artery disease Father      Living Arrangements: Spouse/significant other   Abuse/Neglect [facility]) Physical Abuse: Denies Verbal Abuse: Denies Sexual Abuse: Denies Allergies:   Allergies  Allergen Reactions  . Penicillins Swelling    OF LIPS  . Tegretol [Carbamazepine] Other (See Comments)    HYPONATREMIA    ACT Assessment Complete:  No:   Past Psychiatric History: Diagnosis:  Unknown   Hospitalizations:  Unknown   Outpatient Care:  Unknown   Substance Abuse Care:  Unknown   Self-Mutilation:  Unknown   Suicidal Attempts:  Unknown   Homicidal Behaviors:  Unknown    Violent Behaviors:  Unknown    Place of Residence:  Lives with her husband Marital Status:  Married Employed/Unemployed:  Unknown Education:  Unknown Family Supports:  Husband is supportive Objective: Blood pressure 115/58, pulse 71, temperature 97 F (36.1 C), temperature source Oral, resp. rate 16, height 5' 0.5" (1.537 m), weight 184 lb 8.4 oz (83.7 kg), SpO2 97.00%.Body mass index is 35.43 kg/(m^2). Results for orders placed during the hospital encounter of 08/30/13 (from the past 72 hour(s))  GLUCOSE, CAPILLARY     Status: Abnormal   Collection Time    08/30/13  3:20 PM      Result Value Range   Glucose-Capillary 279 (*) 70 - 99 mg/dL   Comment 1 Notify RN    CBC     Status: Abnormal   Collection Time    08/30/13  3:33 PM      Result Value Range   WBC 16.2 (*) 4.0 - 10.5 K/uL   RBC 4.52  3.87 - 5.11 MIL/uL   Hemoglobin 12.1  12.0 - 15.0 g/dL   HCT 16.1  09.6 - 04.5 %   MCV 83.4  78.0 - 100.0 fL   MCH 26.8  26.0 - 34.0 pg   MCHC 32.1  30.0 - 36.0 g/dL   RDW 40.9  81.1 - 91.4 %   Platelets 314  150 - 400 K/uL  BASIC METABOLIC PANEL     Status: Abnormal   Collection Time    08/30/13  3:33 PM      Result Value Range   Sodium 131 (*) 135 - 145 mEq/L   Potassium 4.9  3.5 - 5.1 mEq/L   Chloride 98  96 - 112 mEq/L   CO2 22  19 - 32 mEq/L   Glucose, Bld 259  (*) 70 - 99 mg/dL   BUN 18  6 - 23 mg/dL   Creatinine, Ser 7.82  0.50 - 1.10 mg/dL   Calcium 8.7  8.4 - 95.6 mg/dL  GFR calc non Af Amer 84 (*) >90 mL/min   GFR calc Af Amer >90  >90 mL/min   Comment: (NOTE)     The eGFR has been calculated using the CKD EPI equation.     This calculation has not been validated in all clinical situations.     eGFR's persistently <90 mL/min signify possible Chronic Kidney     Disease.  DIFFERENTIAL     Status: Abnormal   Collection Time    08/30/13  3:33 PM      Result Value Range   Neutrophils Relative % 70  43 - 77 %   Neutro Abs 11.3 (*) 1.7 - 7.7 K/uL   Lymphocytes Relative 22  12 - 46 %   Lymphs Abs 3.5  0.7 - 4.0 K/uL   Monocytes Relative 8  3 - 12 %   Monocytes Absolute 1.3 (*) 0.1 - 1.0 K/uL   Eosinophils Relative 0  0 - 5 %   Eosinophils Absolute 0.1  0.0 - 0.7 K/uL   Basophils Relative 0  0 - 1 %   Basophils Absolute 0.0  0.0 - 0.1 K/uL  ETHANOL     Status: None   Collection Time    08/30/13  4:23 PM      Result Value Range   Alcohol, Ethyl (B) <11  0 - 11 mg/dL   Comment:            LOWEST DETECTABLE LIMIT FOR     SERUM ALCOHOL IS 11 mg/dL     FOR MEDICAL PURPOSES ONLY  PROTIME-INR     Status: None   Collection Time    08/30/13  4:23 PM      Result Value Range   Prothrombin Time 13.5  11.6 - 15.2 seconds   INR 1.05  0.00 - 1.49  APTT     Status: None   Collection Time    08/30/13  4:23 PM      Result Value Range   aPTT 31  24 - 37 seconds  TROPONIN I     Status: None   Collection Time    08/30/13  4:23 PM      Result Value Range   Troponin I <0.30  <0.30 ng/mL   Comment:            Due to the release kinetics of cTnI,     a negative result within the first hours     of the onset of symptoms does not rule out     myocardial infarction with certainty.     If myocardial infarction is still suspected,     repeat the test at appropriate intervals.  HEPATIC FUNCTION PANEL     Status: Abnormal   Collection Time     08/30/13  4:23 PM      Result Value Range   Total Protein 7.0  6.0 - 8.3 g/dL   Albumin 3.3 (*) 3.5 - 5.2 g/dL   AST 086 (*) 0 - 37 U/L   ALT 150 (*) 0 - 35 U/L   Alkaline Phosphatase 105  39 - 117 U/L   Total Bilirubin 0.4  0.3 - 1.2 mg/dL   Bilirubin, Direct 0.1  0.0 - 0.3 mg/dL   Indirect Bilirubin 0.3  0.3 - 0.9 mg/dL  POCT I-STAT TROPONIN I     Status: None   Collection Time    08/30/13  4:28 PM      Result Value Range   Troponin i,  poc 0.02  0.00 - 0.08 ng/mL   Comment 3            Comment: Due to the release kinetics of cTnI,     a negative result within the first hours     of the onset of symptoms does not rule out     myocardial infarction with certainty.     If myocardial infarction is still suspected,     repeat the test at appropriate intervals.  URINE RAPID DRUG SCREEN (HOSP PERFORMED)     Status: None   Collection Time    08/30/13  5:32 PM      Result Value Range   Opiates NONE DETECTED  NONE DETECTED   Cocaine NONE DETECTED  NONE DETECTED   Benzodiazepines NONE DETECTED  NONE DETECTED   Amphetamines NONE DETECTED  NONE DETECTED   Tetrahydrocannabinol NONE DETECTED  NONE DETECTED   Barbiturates NONE DETECTED  NONE DETECTED   Comment:            DRUG SCREEN FOR MEDICAL PURPOSES     ONLY.  IF CONFIRMATION IS NEEDED     FOR ANY PURPOSE, NOTIFY LAB     WITHIN 5 DAYS.                LOWEST DETECTABLE LIMITS     FOR URINE DRUG SCREEN     Drug Class       Cutoff (ng/mL)     Amphetamine      1000     Barbiturate      200     Benzodiazepine   200     Tricyclics       300     Opiates          300     Cocaine          300     THC              50  URINALYSIS, ROUTINE W REFLEX MICROSCOPIC     Status: Abnormal   Collection Time    08/30/13  5:32 PM      Result Value Range   Color, Urine AMBER (*) YELLOW   Comment: BIOCHEMICALS MAY BE AFFECTED BY COLOR   APPearance CLOUDY (*) CLEAR   Specific Gravity, Urine 1.030  1.005 - 1.030   pH 5.5  5.0 - 8.0   Glucose, UA  250 (*) NEGATIVE mg/dL   Hgb urine dipstick MODERATE (*) NEGATIVE   Bilirubin Urine NEGATIVE  NEGATIVE   Ketones, ur 15 (*) NEGATIVE mg/dL   Protein, ur 30 (*) NEGATIVE mg/dL   Urobilinogen, UA 0.2  0.0 - 1.0 mg/dL   Nitrite POSITIVE (*) NEGATIVE   Leukocytes, UA NEGATIVE  NEGATIVE  URINE MICROSCOPIC-ADD ON     Status: Abnormal   Collection Time    08/30/13  5:32 PM      Result Value Range   Squamous Epithelial / LPF RARE  RARE   WBC, UA 7-10  <3 WBC/hpf   RBC / HPF 3-6  <3 RBC/hpf   Bacteria, UA FEW (*) RARE   Casts HYALINE CASTS (*) NEGATIVE  ACETAMINOPHEN LEVEL     Status: None   Collection Time    08/30/13  6:59 PM      Result Value Range   Acetaminophen (Tylenol), Serum <15.0  10 - 30 ug/mL   Comment:            THERAPEUTIC CONCENTRATIONS VARY  SIGNIFICANTLY. A RANGE OF 10-30     ug/mL MAY BE AN EFFECTIVE     CONCENTRATION FOR MANY PATIENTS.     HOWEVER, SOME ARE BEST TREATED     AT CONCENTRATIONS OUTSIDE THIS     RANGE.     ACETAMINOPHEN CONCENTRATIONS     >150 ug/mL AT 4 HOURS AFTER     INGESTION AND >50 ug/mL AT 12     HOURS AFTER INGESTION ARE     OFTEN ASSOCIATED WITH TOXIC     REACTIONS.  GLUCOSE, CAPILLARY     Status: Abnormal   Collection Time    08/30/13 10:39 PM      Result Value Range   Glucose-Capillary 267 (*) 70 - 99 mg/dL   Comment 1 Documented in Chart     Comment 2 Notify RN    CBC     Status: Abnormal   Collection Time    08/31/13  4:03 AM      Result Value Range   WBC 14.1 (*) 4.0 - 10.5 K/uL   RBC 4.16  3.87 - 5.11 MIL/uL   Hemoglobin 11.0 (*) 12.0 - 15.0 g/dL   HCT 16.1 (*) 09.6 - 04.5 %   MCV 82.9  78.0 - 100.0 fL   MCH 26.4  26.0 - 34.0 pg   MCHC 31.9  30.0 - 36.0 g/dL   RDW 40.9  81.1 - 91.4 %   Platelets 302  150 - 400 K/uL  COMPREHENSIVE METABOLIC PANEL     Status: Abnormal   Collection Time    08/31/13  4:03 AM      Result Value Range   Sodium 135  135 - 145 mEq/L   Potassium 4.2  3.5 - 5.1 mEq/L   Chloride 99  96 - 112  mEq/L   CO2 28  19 - 32 mEq/L   Glucose, Bld 166 (*) 70 - 99 mg/dL   BUN 11  6 - 23 mg/dL   Creatinine, Ser 7.82  0.50 - 1.10 mg/dL   Calcium 8.5  8.4 - 95.6 mg/dL   Total Protein 6.4  6.0 - 8.3 g/dL   Albumin 2.9 (*) 3.5 - 5.2 g/dL   AST 213 (*) 0 - 37 U/L   ALT 128 (*) 0 - 35 U/L   Alkaline Phosphatase 92  39 - 117 U/L   Total Bilirubin 0.5  0.3 - 1.2 mg/dL   GFR calc non Af Amer >90  >90 mL/min   GFR calc Af Amer >90  >90 mL/min   Comment: (NOTE)     The eGFR has been calculated using the CKD EPI equation.     This calculation has not been validated in all clinical situations.     eGFR's persistently <90 mL/min signify possible Chronic Kidney     Disease.  GLUCOSE, CAPILLARY     Status: Abnormal   Collection Time    08/31/13  7:42 AM      Result Value Range   Glucose-Capillary 210 (*) 70 - 99 mg/dL  GLUCOSE, CAPILLARY     Status: Abnormal   Collection Time    08/31/13 11:36 AM      Result Value Range   Glucose-Capillary 192 (*) 70 - 99 mg/dL   Comment 1 Notify RN     Labs are reviewed and are pertinent for increased WBC count.  Current Facility-Administered Medications  Medication Dose Route Frequency Provider Last Rate Last Dose  . atorvastatin (LIPITOR) tablet 10 mg  10 mg  Oral q1800 Renae Fickle, MD      . ciprofloxacin (CIPRO) tablet 250 mg  250 mg Oral BID Tarry Kos, MD   250 mg at 08/31/13 1145  . estrogens (conjugated) (PREMARIN) tablet 0.625 mg  0.625 mg Oral Daily Renae Fickle, MD   0.625 mg at 08/31/13 1145  . furosemide (LASIX) tablet 20 mg  20 mg Oral Daily Renae Fickle, MD   20 mg at 08/31/13 1145  . haloperidol lactate (HALDOL) injection 1 mg  1 mg Intramuscular Q6H PRN Renae Fickle, MD      . influenza vac split quadrivalent PF (FLUARIX) injection 0.5 mL  0.5 mL Intramuscular Prior to discharge Tarry Kos, MD      . insulin aspart (novoLOG) injection 0-9 Units  0-9 Units Subcutaneous TID WC Tarry Kos, MD   2 Units at 08/31/13 1144  .  insulin glargine (LANTUS) injection 26 Units  26 Units Subcutaneous QHS Tarry Kos, MD   26 Units at 08/30/13 2247  . levETIRAcetam (KEPPRA XR) 24 hr tablet 500 mg  500 mg Oral BID Renae Fickle, MD   500 mg at 08/31/13 1145  . LORazepam (ATIVAN) tablet 0.5 mg  0.5 mg Oral Q6H PRN Leda Gauze, NP   0.5 mg at 08/31/13 0609  . naloxone [facility]) injection 0.4 mg  0.4 mg Intravenous PRN Tarry Kos, MD      . ondansetron [facility]) tablet 4 mg  4 mg Oral Q6H PRN Tarry Kos, MD       Or  . ondansetron (ZOFRAN) injection 4 mg  4 mg Intravenous Q6H PRN Tarry Kos, MD      . pantoprazole (PROTONIX) EC tablet 40 mg  40 mg Oral Daily Renae Fickle, MD   40 mg at 08/31/13 1149  . pyridostigmine (MESTINON) tablet 60 mg  60 mg Oral BID Renae Fickle, MD   60 mg at 08/31/13 1148  . QUEtiapine (SEROQUEL) tablet 300 mg  300 mg Oral QHS Renae Fickle, MD      . sodium chloride 0.9 % injection 3 mL  3 mL Intravenous Q12H Tarry Kos, MD   3 mL at 08/30/13 2248  . venlafaxine XR (EFFEXOR-XR) 24 hr capsule 150 mg  150 mg Oral Daily Tarry Kos, MD   150 mg at 08/31/13 1145    Psychiatric Specialty Exam:     Blood pressure 115/58, pulse 71, temperature 97 F (36.1 C), temperature source Oral, resp. rate 16, height 5' 0.5" (1.537 m), weight 184 lb 8.4 oz (83.7 kg), SpO2 97.00%.Body mass index is 35.43 kg/(m^2).  General Appearance: Bizarre and Guarded  Eye Contact::  Poor  Speech:  Blocked and Pressured  Volume:  Decreased  Mood:  Angry and Irritable  Affect:  Labile  Thought Process:  Circumstantial, Disorganized, Loose and Tangential  Orientation:  Other:  Alert and oriented x1  Thought Content:  Rumination  Suicidal Thoughts:  No  Homicidal Thoughts:  No  Memory:  Remote;   Poor  Judgement:  Poor  Insight:  Lacking and Shallow  Psychomotor Activity:  Increased  Concentration:  Poor  Recall:  Poor  Akathisia:  No  Handed:  Right  AIMS (if indicated):     Assets:   Housing Social Support  Sleep:      Treatment Plan Summary: Medication management Need collateral information from the husband.  Discussed with Dr Malachi Bonds patient restarted on venlafaxine and Seroquel recently.  Agreed to avoid withdrawal symptoms.  Patient is refusing intravenous antibiotic for UTI.  Patient requires sitter for safety and confusion.  Patient may require Haldol IM /IV 2-5 mg for severe agitation .  Patient need to be seen again by psychiatry if patient remains very labile and her psychosis does not get better.  Please call [phone] if you have further questions.   Emer Onnen T. 08/31/2013 4:35 PM

## 2013-08-31 NOTE — Progress Notes (Signed)
Patient refusing vitals and refusing to get back in bed this morning, also combative and verbally aggressive  towards staff. MD notified and 0.5 mg Ativan IV ordered. When RN went to room to administer Ativan, patient continued to be combative towards RN. Patient's husband notified of patient's status and spoke to patient in attempt to deescalate the situation with no success. Patient then swung at staff and pushed staff out of the way and ripped out her IV as she walked quickly out of the room. Patient was swinging arm around that IV had been ripped out of and blood was dripping onto the floor and the walls. Security and [facility] notified. Security officer able to calm down patient enough to get her to return to her room. MD notified and 1 mg IM Haldol ordered. Security officer able to convince patient to get in bed so injection could be administered. 1 mg IM Haldol administered by RN. Patient continued to be verbally aggressive to other staff members, telling them to leave her room. Patient left in room with bed alarm on and doors open. Will continue to monitor and reassess patient.

## 2013-08-31 NOTE — Progress Notes (Signed)
Went in room to give patient insulin and simvastatin.  Patient was lying in bed with covers over her head.  She asked what I was going to give her and I explained it. She stated that she did not want what I had and she did not have the conditions that I explained to her.  She quickly stood up and I tried to get her to lie back down in bed.  She stood up and grabbed both of my hands with medium strength.  She had a firm grip on my hands.  I was able to get loose.  She walked to the door and started walking down the hall.  She stated that she felt sorry for my Mom for having a daughter like me.  The NT was able to calm the patient and lead her back to the room.

## 2013-08-31 NOTE — Progress Notes (Addendum)
[facility] PROGRESS NOTE  Joyce Richardson NWG:[id number] DOB: 25-Nov-1945 DOA: 08/30/2013 PCP: Astrid Divine, MD  Assessment/Plan   Possibly narcotic overdose, resolved.  She responded well to narcan.  UDS negative, however, our rapid screen does not detect some common opioid compounds. -  D/c telemetry, continuous pulse ox and neuro checks  Acute delirium.  Per husband, the patient has had problems with delirium in the past.  She also has some neurologic/psychiatric disorders that cause her to have vivid, violent dreams that put her at risk of injury.  She is followed by [facility].  She exhibits very poor memory and is easily agitated.  Overnight, she pulled out her IV and was chasing the nursing staff very aggressively.  -  MRI/MRA brain negative for acute stroke -  [facility] unfortunately closed already this evening -  Psychiatry consult recommended resuming her home medications and using haldol prn for agitation -  Restarted venlafaxine, pregabalin, quetiapine, clonazepam  Acute on chronic diastolic CHF with interstitial edema on CXR -  Pro-BNP not drawn because patient refused labs -  Start oral lasix (patient ripped out IV) -  Daily weights -  Strict I/O  Left fibular avulsion fracture, XR of foot and upper leg due to pain over 2nd MT and upper fibula.  Possible upper fibular fracture, however, upon repeat examination, patient no longer has the severe pain she did earlier.  -  continue boot  Alcohol abuse in remission, although she may have drank some alcohol as her LFTs were elevated in a 2:1 ratio -  Monitor for EtOH w/d.  EtOH level < 10.  UTI as suggested by positive nitrite, 7-10 WBC on UA -  Continue cipro, day 2  Transaminitis -  Acute hepatitis panel (Hep A Ab, Hep Bs Ag and sAb, Hep C Ab)  Cigarette nicotine dependence with withdrawal -  Nicotine patch  Seizure disorder, seizure free -  Continue keppra  T2DM, uncontrolled, last  A1c 6 -  A1c  -  Continue lantus 26 units and low dose SSI  Leukocytosis, trending down  Normocytic anemia, acute and may be related to marrow suppression from acute illness -  Repeat CBC in AM  Diet:  Healthy heart Access:  PIV IVF:  OFF Proph:  SCD  Code Status: full Family Communication: patient and her husband Disposition Plan: home tomorrow    Consultants:  Psychiatry   Procedures:  MRI brain 10/9  MRA brain 10/9  Antibiotics:  Ciprofloxacin 10/9 >>   HPI/Subjective:  Patient quite angry and agitated, very confused about where she is and what is going on.  Husband provided background histroy.  She reports pain below the left knee and around the left medial ankle.  Also has some SOB with wheezing.  Denies CP, N/V/D/C.      Objective: Filed Vitals:   08/30/13 1924 08/30/13 1955 08/30/13 2055 08/31/13 1400  BP: 108/71  133/67 115/58  Pulse: 106  101 71  Temp:  98.5 F (36.9 C) 98 F (36.7 C) 97 F (36.1 C)  TempSrc:  Oral Oral Oral  Resp:  18 16   Height:   5' 0.5" (1.537 m)   Weight:   83.7 kg (184 lb 8.4 oz)   SpO2: 95%  92% 97%    Intake/Output Summary (Last 24 hours) at 08/31/13 1741 Last data filed at 08/31/13 0353  Gross per 24 hour  Intake      0 ml  Output   1000 ml  Net  -  1000 ml   Filed Weights   08/30/13 2055  Weight: 83.7 kg (184 lb 8.4 oz)    Exam:   General:  CF, No acute distress, initially very angry, then pleasant  HEENT:  NCAT, MMM  Cardiovascular:  RRR, nl S1, S2 no mrg, 2+ pulses, warm extremities  Respiratory:  Diminished bilateral breath sounds with full expiratory wheeze, no focal rales or rhonchi, no increased WOB until she gets agitated at which time she develops SCM retractions, suprasternal retractions and wheeze that is audible without stethoscope  Abdomen:   NABS, soft, NT/ND  MSK:   Normal tone and bulk, no LEE  Neuro:  Grossly intact  Data Reviewed: Basic Metabolic Panel:  Recent Labs Lab  08/30/13 1533 08/31/13 0403  NA 131* 135  K 4.9 4.2  CL 98 99  CO2 22 28  GLUCOSE 259* 166*  BUN 18 11  CREATININE 0.77 0.60  CALCIUM 8.7 8.5   Liver Function Tests:  Recent Labs Lab 08/30/13 1623 08/31/13 0403  AST 358* 266*  ALT 150* 128*  ALKPHOS 105 92  BILITOT 0.4 0.5  PROT 7.0 6.4  ALBUMIN 3.3* 2.9*   No results found for this basename: LIPASE, AMYLASE,  in the last 168 hours No results found for this basename: AMMONIA,  in the last 168 hours CBC:  Recent Labs Lab 08/30/13 1533 08/31/13 0403  WBC 16.2* 14.1*  NEUTROABS 11.3*  --   HGB 12.1 11.0*  HCT 37.7 34.5*  MCV 83.4 82.9  PLT 314 302   Cardiac Enzymes:  Recent Labs Lab 08/30/13 1623  TROPONINI <0.30   BNP (last 3 results)  Recent Labs  05/18/13 1240  PROBNP 583.7*   CBG:  Recent Labs Lab 08/30/13 1520 08/30/13 2239 08/31/13 0742 08/31/13 1136 08/31/13 1700  GLUCAP 279* 267* 210* 192* 243*    No results found for this or any previous visit (from the past 240 hour(s)).   Studies: Dg Chest 2 View  08/30/2013   *RADIOLOGY REPORT*  Clinical Data: Altered mental status; admission chest radiograph.  CHEST - 2 VIEW  Comparison: Chest radiograph from 05/18/2013  Findings: The lungs are well-aerated.  Vascular congestion is noted, with mildly increased interstitial markings, suggestive of mild interstitial edema.  No pleural effusion or pneumothorax is seen.  The heart is mildly enlarged.  No acute osseous abnormalities are seen.  Clips are noted within the right upper quadrant, reflecting prior cholecystectomy.  IMPRESSION: Vascular congestion and mild cardiomegaly, with mildly increased interstitial markings, suggestive of mild interstitial edema.   Original Report Authenticated By: Tonia Ghent, M.D.   Dg Tibia/fibula Left  08/31/2013   *RADIOLOGY REPORT*  Clinical Data: Post fall, history of avulsion fracture involving the ankle, now with pain approximately 3 cm below the knee,  subsequent encounter.  LEFT TIBIA AND FIBULA - 2 VIEW  Comparison: Left ankle radiographs - earlier same day; 08/30/2013  Findings:  The known minimal displaced avulsion fracture involving the distal end of the fibula is suboptimally evaluated on the present examination to [facility]  There is an ill-defined serpiginous lucency involving the medial aspect of the proximal fibula.  Limited visualization of the adjacent knee and ankle is normal.  Regional soft tissues are normal.  No radiopaque foreign body.  IMPRESSION: 1.  Serpiginous lucency involving the medial aspect of the proximal fibula may represent a nondisplaced fracture.  Correlation for point tenderness at this location is recommended. 2.  The known a minimally displaced avulsion fracture involving the distal  end of the fibula is suboptimally evaluated on the present examination.   Original Report Authenticated By: Tacey Ruiz, MD   Dg Ankle Complete Left  08/30/2013   CLINICAL DATA:  Fall, twisted ankle with lateral pain and swelling  EXAM: LEFT ANKLE COMPLETE - 3+ VIEW  COMPARISON:  None.  FINDINGS: A small avulsion fracture at the distal tip of the lateral malleolus with associated overlying soft tissue swelling. Plantar calcaneal spurring. No ankle joint effusion. The remainder of the visualized bones and joints are intact and unremarkable.  IMPRESSION: Small avulsion fracture at the distal tip of the lateral malleolus.   Electronically Signed   By: Malachy Moan M.D.   On: 08/30/2013 01:58   Ct Head Wo Contrast  08/30/2013   CLINICAL DATA:  Altered mental status.  EXAM: CT HEAD WITHOUT CONTRAST  TECHNIQUE: Contiguous axial images were obtained from the base of the skull through the vertex without intravenous contrast.  COMPARISON:  05/18/2013.  FINDINGS: Interval ill-defined areas of low density involving the gray and white matter in the left frontoparietal region and right parietal occipital region. No intracranial hemorrhage or mass lesion.  Mild patchy white matter low density in both frontal lobes. Diffusely enlarged ventricles and subarachnoid spaces with little change. Unremarkable bones and included paranasal sinuses.  IMPRESSION: 1. Findings suspicious for acute bilateral cerebral infarctions, as described above. Further evaluation with MRI and possibly MRA is recommended. 2. Atrophy and mild chronic small vessel white matter ischemic changes.  These results were called by telephone at the time of interpretation on 08/30/2013 at 3:56 PM to [facility] , who verbally acknowledged these results.   Electronically Signed   By: Gordan Payment M.D.   On: 08/30/2013 15:55   Mr Angiogram Head Wo Contrast  08/30/2013   CLINICAL DATA:  Altered mental status, recent ankle trauma. Lethargic. Evaluate for cerebral infarction.  EXAM: MRI HEAD WITHOUT CONTRAST  MRA HEAD WITHOUT CONTRAST  TECHNIQUE: Multiplanar, multiecho pulse sequences of the brain and surrounding structures were obtained without intravenous contrast. Angiographic images of the head were obtained using MRA technique without contrast.  COMPARISON:  CT head earlier today.  FINDINGS: MRI HEAD FINDINGS  No evidence for acute infarction, hemorrhage, mass lesion, hydrocephalus, or extra-axial fluid. Mild chronic microvascular ischemic change. No chronic hemorrhage. Bilateral cataract extraction. Negative osseous structures. No sinus or mastoid disease. Compared with CT earlier today, the appearance is secondary to atrophy, asymmetry of the patient in the gantry, and motion.  MRA HEAD FINDINGS  The internal carotid arteries are widely patent. The basilar artery is widely patent. Both vertebrals contribute to its formation. There is no intracranial stenosis or aneurysm.  IMPRESSION: MRI HEAD IMPRESSION  No acute intracranial abnormality. Atrophy and small vessel disease.  MRA HEAD IMPRESSION  Unremarkable MRA intracranial.   Electronically Signed   By: Davonna Belling M.D.   On: 08/30/2013 17:42    Mr Brain Wo Contrast  08/30/2013   CLINICAL DATA:  Altered mental status, recent ankle trauma. Lethargic. Evaluate for cerebral infarction.  EXAM: MRI HEAD WITHOUT CONTRAST  MRA HEAD WITHOUT CONTRAST  TECHNIQUE: Multiplanar, multiecho pulse sequences of the brain and surrounding structures were obtained without intravenous contrast. Angiographic images of the head were obtained using MRA technique without contrast.  COMPARISON:  CT head earlier today.  FINDINGS: MRI HEAD FINDINGS  No evidence for acute infarction, hemorrhage, mass lesion, hydrocephalus, or extra-axial fluid. Mild chronic microvascular ischemic change. No chronic hemorrhage. Bilateral cataract extraction. Negative osseous  structures. No sinus or mastoid disease. Compared with CT earlier today, the appearance is secondary to atrophy, asymmetry of the patient in the gantry, and motion.  MRA HEAD FINDINGS  The internal carotid arteries are widely patent. The basilar artery is widely patent. Both vertebrals contribute to its formation. There is no intracranial stenosis or aneurysm.  IMPRESSION: MRI HEAD IMPRESSION  No acute intracranial abnormality. Atrophy and small vessel disease.  MRA HEAD IMPRESSION  Unremarkable MRA intracranial.   Electronically Signed   By: Davonna Belling M.D.   On: 08/30/2013 17:42   Dg Foot Complete Left  08/31/2013   *RADIOLOGY REPORT*  Clinical Data: Pain involving the second MTP joint  LEFT FOOT - COMPLETE 3+ VIEW  Comparison: The left ankle radiographs - 08/30/2013  Findings:  The known a minimally displaced avulsion fracture involving the distal end of the fibula is suboptimally evaluate on the present examination.  No additional fracture or dislocation.  Regional soft tissues are normal.  A small plantar calcaneal spur.  Minimal enthesopathic change of the Achilles tendon insertion site.  IMPRESSION: 1.  No new fracture.  Known minimally displaced fracture involving the distal end of the fibula is suboptimally  evaluated on the present examination.  2.  Small plantar calcaneal spur.   Original Report Authenticated By: Tacey Ruiz, MD    Scheduled Meds: . atorvastatin  10 mg Oral q1800  . ciprofloxacin  250 mg Oral BID  . clonazePAM  0.25 mg Oral QHS  . estrogens (conjugated)  0.625 mg Oral Daily  . furosemide  20 mg Oral Daily  . insulin aspart  0-9 Units Subcutaneous TID WC  . insulin glargine  26 Units Subcutaneous QHS  . levETIRAcetam  500 mg Oral BID  . pantoprazole  40 mg Oral Daily  . pregabalin  300 mg Oral QHS  . pyridostigmine  60 mg Oral BID  . QUEtiapine  300 mg Oral QHS  . sodium chloride  3 mL Intravenous Q12H  . venlafaxine XR  150 mg Oral Daily   Continuous Infusions:   Principal Problem:   Acute delirium Active Problems:   ABUSE, ALCOHOL, IN REMISSION   NARCOTIC ABUSE   HYPERTENSION   UTI (urinary tract infection)   Chronic diastolic CHF (congestive heart failure)   Seizure disorder   Nocturnal hypoxia   Polysubstance abuse   DM (diabetes mellitus), type 2, uncontrolled    Time spent: 30 min    Bridie Colquhoun  [facility] Pager [phone]. If 7PM-7AM, please contact night-coverage at [email or web address], password [facility] 08/31/2013, 5:41 PM  LOS: 1 day

## 2013-09-01 LAB — HEPATIC FUNCTION PANEL
ALT: 101 U/L — ABNORMAL HIGH (ref 0–35)
AST: 144 U/L — ABNORMAL HIGH (ref 0–37)
Albumin: 2.8 g/dL — ABNORMAL LOW (ref 3.5–5.2)
Indirect Bilirubin: 0.3 mg/dL (ref 0.3–0.9)
Total Protein: 6.4 g/dL (ref 6.0–8.3)

## 2013-09-01 LAB — BASIC METABOLIC PANEL
BUN: 12 mg/dL (ref 6–23)
CO2: 27 mEq/L (ref 19–32)
Calcium: 8.4 mg/dL (ref 8.4–10.5)
Chloride: 102 mEq/L (ref 96–112)
Creatinine, Ser: 0.64 mg/dL (ref 0.50–1.10)
GFR calc Af Amer: >90 mL/min (ref >90)
Glucose, Bld: 113 mg/dL — ABNORMAL HIGH (ref 70–99)
Potassium: 3.4 mEq/L — ABNORMAL LOW (ref 3.5–5.1)

## 2013-09-01 LAB — CBC
HCT: 36.6 % (ref 36.0–46.0)
MCH: 26.4 pg (ref 26.0–34.0)
MCHC: 32 g/dL (ref 30.0–36.0)
MCV: 82.4 fL (ref 78.0–100.0)
RDW: 14.5 % (ref 11.5–15.5)

## 2013-09-01 LAB — HEPATITIS PANEL, ACUTE
Hep A IgM: NEGATIVE
Hep B C IgM: NEGATIVE
Hepatitis B Surface Ag: NEGATIVE

## 2013-09-01 LAB — GLUCOSE, CAPILLARY
Glucose-Capillary: 126 mg/dL — ABNORMAL HIGH (ref 70–99)
Glucose-Capillary: 243 mg/dL — ABNORMAL HIGH (ref 70–99)

## 2013-09-01 MED ORDER — HYDROCHLOROTHIAZIDE 12.5 MG PO TABS: 12.5000 mg | ORAL_TABLET | Freq: Every day | ORAL | Status: DC

## 2013-09-01 MED ORDER — PYRIDOSTIGMINE BROMIDE 60 MG PO TABS
60.0000 mg | ORAL_TABLET | Freq: Every day | ORAL | Status: DC
  Administered 2013-09-01: 60 mg via ORAL
  Filled 2013-09-01: qty 1

## 2013-09-01 MED ORDER — IPRATROPIUM BROMIDE 0.02 % IN SOLN: 500.0000 ug | Freq: Four times a day (QID) | RESPIRATORY_TRACT | Status: DC

## 2013-09-01 MED ORDER — ALBUTEROL SULFATE (2.5 MG/3ML) 0.083% IN NEBU: 2.5000 mg | INHALATION_SOLUTION | Freq: Four times a day (QID) | RESPIRATORY_TRACT | Status: DC | PRN

## 2013-09-01 MED ORDER — NICOTINE 14 MG/24HR TD PT24: 1.0000 | MEDICATED_PATCH | Freq: Every day | TRANSDERMAL | Status: DC

## 2013-09-01 MED ORDER — POTASSIUM CHLORIDE CRYS ER 20 MEQ PO TBCR
40.0000 meq | EXTENDED_RELEASE_TABLET | Freq: Once | ORAL | Status: AC
  Administered 2013-09-01: 40 meq via ORAL
  Filled 2013-09-01 (×2): qty 2

## 2013-09-01 NOTE — Discharge Summary (Signed)
Physician Discharge Summary  MARIANNA CID HQI:[id number] DOB: 1946-06-18 DOA: 08/30/2013  PCP: Astrid Divine, MD  Admit date: 08/30/2013 Discharge date: 09/01/2013  Recommendations for Outpatient Follow-up:  1. Follow up with primary care doctor in 1 week for CMP to check creatinine, potassium and liver function tests.  F/u hepatitis panel and hemoglobin A1c.  CBC to f/u leukocytosis and anemia.   2. Home health PT 3. Started low dose HCTZ and ordered nebulizer machine for patient with duonebs  Discharge Diagnoses:  Principal Problem:   Acute delirium Active Problems:   LEUKOCYTOSIS   ABUSE, ALCOHOL, IN REMISSION   TOBACCO USE   NARCOTIC ABUSE   HYPERTENSION   COPD GOLD 0   UTI (urinary tract infection)   Chronic diastolic CHF (congestive heart failure)   Seizure disorder   Nocturnal hypoxia   Polysubstance abuse   DM (diabetes mellitus), type 2, uncontrolled   Acute on chronic diastolic heart failure   Discharge Condition: stable, improved  Diet recommendation: diabetic diet  Wt Readings from Last 3 Encounters:  09/01/13 81.738 kg (180 lb 3.2 oz)  08/15/13 85.276 kg (188 lb)  06/28/13 80.287 kg (177 lb)    History of present illness:  67 yo female h/o diastolic chf, polysubstance abuse with overdose in past, etoh abuse, sz disorder unclear if in setting of drug abuse, dm, htn comes in with ems with somnulence. Husband reported this is how she was when she overdosed on her meds before. No report of fevers, n/v.d. Pt received narcan x 2 doses in ED with good response. Pt denies taking any xanax, pain pills, denies any etoh abuse. She is currently awake and able to answer questions. Husband is not. She denies any recent illnesses.    Hospital Course:   Ms. Whitehorn was admitted with possible narcotic overdose.  She responded well to narcan despite her UDS being negative for opioids.  Our rapid urine drug screen does not detect some common opioids.  She quickly  became alert.    Acute delirium.  After she awoke from her stupor, she was observed overnight.  Her home sedating medications were held.  That night, she became extremely agitated and confused.  She pulled out her IV and sprayed blood on the nursing staff.  She was verbally and physically aggressive towards the staff and security was called.  Her MRI/MRA brain were negative for acute stroke.  Her husband stated that she becomes very agitated overnight sometimes due to vivid nightmares and her behavior here was similar to behaviors he had seen at home.  She is followed by [facility] for this problem and is on several mediations for depression and behavior.  I was not able to reach them before the end of office hours on Friday, however, psychiatry was consulted and recommended restarting her home medications.  She may have had some withdrawal symptoms from her medications which led to her delirium and after restarting these medications, she slept well and woke up more oriented and pleasant.    Acute on chronic diastolic CHF with interstitial edema on CXR.  Her pro-BNP was mildly elevated 378.  She was started on oral lasix and her breathing improved.  She was given a prescription for low dose HCTZ to take at home.  Needs BMP in one to two weeks to check potassium and creatinine.    Left fibular avulsion fracture.  She initially complained of severe pain over the second MT and a couple cm below the knee so  more X-rays were obtained.  She did not have any obvious fractures although there was a suggestion of lucency of the fibular a few cm below the knee.  There was no pain with palpation of this area on exam, however, and she was able to ambulate without difficulty.  She was advised to continue wearing her boot during the day and when ambulated and to remove it to rest at night.   Alcohol abuse in remission, although she may have drank some alcohol as her LFTs were elevated in a 2:1 ratio.   EtOH level < 10.   Transaminitis suggestive of recent EtOH consumption.  Her LFTs trended down sponteously.  Hepatitis panel is pending at the time of discharge. (Hep A Ab, Hep Bs Ag and sAb, Hep C Ab)   UTI was suggested by positive nitrite, 7-10 WBC on UA and urinary frequency.  She was started on empiric ciprofloxacin, however, urine culture was negative.   Cigarette nicotine dependence with withdrawal.   Nicotine patch given and counseled cessation.  Seizure disorder, seizure free.  Continued keppra.   T2DM, uncontrolled, last A1c 6 in 04/2012.  A1c pending.  Continue home diabetes regimen.   Leukocytosis, trended down with antibiotics.    Normocytic anemia, acute and may be related to marrow suppression from acute illness.  Hemoglobin remained stable.  She should follow up with her primary care doctor in 2 weeks for repeat CBC and BMP.    Consultants:  Psychiatry  Procedures:  MRI brain 10/9  MRA brain 10/9 Antibiotics:  Ciprofloxacin 10/9 >>    Discharge Exam: Filed Vitals:   09/01/13 0617  BP: 108/69  Pulse: 70  Temp: 98.6 F (37 C)  Resp: 18   Filed Vitals:   08/31/13 2035 09/01/13 0617 09/01/13 0643 09/01/13 0828  BP: 121/75 108/69    Pulse: 66 70    Temp: 97.9 F (36.6 C) 98.6 F (37 C)    TempSrc: Oral Oral    Resp: 16 18    Height:      Weight:   81.738 kg (180 lb 3.2 oz)   SpO2: 93% 91%  91%    General: CF, No acute distress HEENT: NCAT, MMM  Cardiovascular: RRR, nl S1, S2 no mrg, 2+ pulses, warm extremities  Respiratory: Diminished bilateral breath sounds with full expiratory wheeze, no focal rales or rhonchi, no increased WOB Abdomen: NABS, soft, NT/ND  MSK: Normal tone and bulk, no LEE  Neuro: Grossly intact   Discharge Instructions     Medication List    ASK your doctor about these medications       acetaminophen 325 MG tablet  Commonly known as:  TYLENOL  Take 2 tablets (650 mg total) by mouth every 6 (six) hours as needed.      atorvastatin 10 MG tablet  Commonly known as:  LIPITOR  Take 10 mg by mouth daily.     CALCIUM-VITAMIN D PO  Take 1 tablet by mouth daily with breakfast. Calcium 1200- Vitamin D1000 mg.     CENTRUM SILVER PO  Take 1 tablet by mouth daily.     clonazePAM 0.25 MG disintegrating tablet  Commonly known as:  KLONOPIN  Take 1 tablet (0.25 mg total) by mouth at bedtime as needed.     EFFEXOR XR 150 MG 24 hr capsule  Generic drug:  venlafaxine XR  Take 150 mg by mouth daily.     estrogens (conjugated) 0.625 MG tablet  Commonly known as:  PREMARIN  Take  0.625 mg by mouth daily.     insulin glargine 100 UNIT/ML injection  Commonly known as:  LANTUS  Inject 26 Units into the skin at bedtime.     levETIRAcetam 500 MG 24 hr tablet  Commonly known as:  KEPPRA XR  Take 500 mg by mouth 2 (two) times daily.     Liraglutide 18 MG/3ML Sopn  Inject into the skin. Inject 1.8 mg into the skin daily     Magnesium 250 MG Tabs  Take 250 mg by mouth daily with breakfast.     metFORMIN 500 MG 24 hr tablet  Commonly known as:  GLUCOPHAGE-XR  Take 1,500 mg by mouth daily at 12 noon.     omeprazole 20 MG capsule  Commonly known as:  PRILOSEC  Take 20 mg by mouth Daily.     OVER THE COUNTER MEDICATION  Take 1 tablet by mouth as needed (allergies). Wal-itin. (Allergy Relief)     pregabalin 300 MG capsule  Commonly known as:  LYRICA  Take 2 capsules (600 mg total) by mouth at bedtime.     pyridostigmine 60 MG tablet  Commonly known as:  MESTINON  Take 60 mg by mouth 2 (two) times daily.     QUEtiapine 300 MG tablet  Commonly known as:  SEROQUEL  Take 300 mg by mouth at bedtime.          The results of significant diagnostics from this hospitalization (including imaging, microbiology, ancillary and laboratory) are listed below for reference.    Significant Diagnostic Studies: Dg Chest 2 View  08/30/2013   *RADIOLOGY REPORT*  Clinical Data: Altered mental status; admission chest  radiograph.  CHEST - 2 VIEW  Comparison: Chest radiograph from 05/18/2013  Findings: The lungs are well-aerated.  Vascular congestion is noted, with mildly increased interstitial markings, suggestive of mild interstitial edema.  No pleural effusion or pneumothorax is seen.  The heart is mildly enlarged.  No acute osseous abnormalities are seen.  Clips are noted within the right upper quadrant, reflecting prior cholecystectomy.  IMPRESSION: Vascular congestion and mild cardiomegaly, with mildly increased interstitial markings, suggestive of mild interstitial edema.   Original Report Authenticated By: Tonia Ghent, M.D.   Dg Tibia/fibula Left  08/31/2013   *RADIOLOGY REPORT*  Clinical Data: Post fall, history of avulsion fracture involving the ankle, now with pain approximately 3 cm below the knee, subsequent encounter.  LEFT TIBIA AND FIBULA - 2 VIEW  Comparison: Left ankle radiographs - earlier same day; 08/30/2013  Findings:  The known minimal displaced avulsion fracture involving the distal end of the fibula is suboptimally evaluated on the present examination to [facility]  There is an ill-defined serpiginous lucency involving the medial aspect of the proximal fibula.  Limited visualization of the adjacent knee and ankle is normal.  Regional soft tissues are normal.  No radiopaque foreign body.  IMPRESSION: 1.  Serpiginous lucency involving the medial aspect of the proximal fibula may represent a nondisplaced fracture.  Correlation for point tenderness at this location is recommended. 2.  The known a minimally displaced avulsion fracture involving the distal end of the fibula is suboptimally evaluated on the present examination.   Original Report Authenticated By: Tacey Ruiz, MD   Dg Ankle Complete Left  08/30/2013   CLINICAL DATA:  Fall, twisted ankle with lateral pain and swelling  EXAM: LEFT ANKLE COMPLETE - 3+ VIEW  COMPARISON:  None.  FINDINGS: A small avulsion fracture at the distal tip of the lateral  malleolus  with associated overlying soft tissue swelling. Plantar calcaneal spurring. No ankle joint effusion. The remainder of the visualized bones and joints are intact and unremarkable.  IMPRESSION: Small avulsion fracture at the distal tip of the lateral malleolus.   Electronically Signed   By: Malachy Moan M.D.   On: 08/30/2013 01:58   Ct Head Wo Contrast  08/30/2013   CLINICAL DATA:  Altered mental status.  EXAM: CT HEAD WITHOUT CONTRAST  TECHNIQUE: Contiguous axial images were obtained from the base of the skull through the vertex without intravenous contrast.  COMPARISON:  05/18/2013.  FINDINGS: Interval ill-defined areas of low density involving the gray and white matter in the left frontoparietal region and right parietal occipital region. No intracranial hemorrhage or mass lesion. Mild patchy white matter low density in both frontal lobes. Diffusely enlarged ventricles and subarachnoid spaces with little change. Unremarkable bones and included paranasal sinuses.  IMPRESSION: 1. Findings suspicious for acute bilateral cerebral infarctions, as described above. Further evaluation with MRI and possibly MRA is recommended. 2. Atrophy and mild chronic small vessel white matter ischemic changes.  These results were called by telephone at the time of interpretation on 08/30/2013 at 3:56 PM to [facility] , who verbally acknowledged these results.   Electronically Signed   By: Gordan Payment M.D.   On: 08/30/2013 15:55   Mr Angiogram Head Wo Contrast  08/30/2013   CLINICAL DATA:  Altered mental status, recent ankle trauma. Lethargic. Evaluate for cerebral infarction.  EXAM: MRI HEAD WITHOUT CONTRAST  MRA HEAD WITHOUT CONTRAST  TECHNIQUE: Multiplanar, multiecho pulse sequences of the brain and surrounding structures were obtained without intravenous contrast. Angiographic images of the head were obtained using MRA technique without contrast.  COMPARISON:  CT head earlier today.  FINDINGS: MRI HEAD  FINDINGS  No evidence for acute infarction, hemorrhage, mass lesion, hydrocephalus, or extra-axial fluid. Mild chronic microvascular ischemic change. No chronic hemorrhage. Bilateral cataract extraction. Negative osseous structures. No sinus or mastoid disease. Compared with CT earlier today, the appearance is secondary to atrophy, asymmetry of the patient in the gantry, and motion.  MRA HEAD FINDINGS  The internal carotid arteries are widely patent. The basilar artery is widely patent. Both vertebrals contribute to its formation. There is no intracranial stenosis or aneurysm.  IMPRESSION: MRI HEAD IMPRESSION  No acute intracranial abnormality. Atrophy and small vessel disease.  MRA HEAD IMPRESSION  Unremarkable MRA intracranial.   Electronically Signed   By: Davonna Belling M.D.   On: 08/30/2013 17:42   Mr Brain Wo Contrast  08/30/2013   CLINICAL DATA:  Altered mental status, recent ankle trauma. Lethargic. Evaluate for cerebral infarction.  EXAM: MRI HEAD WITHOUT CONTRAST  MRA HEAD WITHOUT CONTRAST  TECHNIQUE: Multiplanar, multiecho pulse sequences of the brain and surrounding structures were obtained without intravenous contrast. Angiographic images of the head were obtained using MRA technique without contrast.  COMPARISON:  CT head earlier today.  FINDINGS: MRI HEAD FINDINGS  No evidence for acute infarction, hemorrhage, mass lesion, hydrocephalus, or extra-axial fluid. Mild chronic microvascular ischemic change. No chronic hemorrhage. Bilateral cataract extraction. Negative osseous structures. No sinus or mastoid disease. Compared with CT earlier today, the appearance is secondary to atrophy, asymmetry of the patient in the gantry, and motion.  MRA HEAD FINDINGS  The internal carotid arteries are widely patent. The basilar artery is widely patent. Both vertebrals contribute to its formation. There is no intracranial stenosis or aneurysm.  IMPRESSION: MRI HEAD IMPRESSION  No acute intracranial abnormality.  Atrophy  and small vessel disease.  MRA HEAD IMPRESSION  Unremarkable MRA intracranial.   Electronically Signed   By: Davonna Belling M.D.   On: 08/30/2013 17:42   Dg Foot Complete Left  08/31/2013   *RADIOLOGY REPORT*  Clinical Data: Pain involving the second MTP joint  LEFT FOOT - COMPLETE 3+ VIEW  Comparison: The left ankle radiographs - 08/30/2013  Findings:  The known a minimally displaced avulsion fracture involving the distal end of the fibula is suboptimally evaluate on the present examination.  No additional fracture or dislocation.  Regional soft tissues are normal.  A small plantar calcaneal spur.  Minimal enthesopathic change of the Achilles tendon insertion site.  IMPRESSION: 1.  No new fracture.  Known minimally displaced fracture involving the distal end of the fibula is suboptimally evaluated on the present examination.  2.  Small plantar calcaneal spur.   Original Report Authenticated By: Tacey Ruiz, MD    Microbiology: Recent Results (from the past 240 hour(s))  URINE CULTURE     Status: None   Collection Time    08/30/13  5:32 PM      Result Value Range Status   Specimen Description URINE, RANDOM   Final   Special Requests NONE   Final   Culture  Setup Time     Final   Value: 08/30/2013 22:32     Performed at [facility]   Colony Count     Final   Value: NO GROWTH     Performed at [facility]   Culture     Final   Value: NO GROWTH     Performed at [facility]   Report Status 08/31/2013 FINAL   Final     Labs: Basic Metabolic Panel:  Recent Labs Lab 08/30/13 1533 08/31/13 0403 09/01/13 0610  NA 131* 135 138  K 4.9 4.2 3.4*  CL 98 99 102  CO2 22 28 27   GLUCOSE 259* 166* 113*  BUN 18 11 12   CREATININE 0.77 0.60 0.64  CALCIUM 8.7 8.5 8.4   Liver Function Tests:  Recent Labs Lab 08/30/13 1623 08/31/13 0403 09/01/13 0610  AST 358* 266* 144*  ALT 150* 128* 101*  ALKPHOS 105 92 83  BILITOT 0.4 0.5 0.4  PROT 7.0 6.4 6.4   ALBUMIN 3.3* 2.9* 2.8*   No results found for this basename: LIPASE, AMYLASE,  in the last 168 hours No results found for this basename: AMMONIA,  in the last 168 hours CBC:  Recent Labs Lab 08/30/13 1533 08/31/13 0403 09/01/13 0610  WBC 16.2* 14.1* 12.2*  NEUTROABS 11.3*  --   --   HGB 12.1 11.0* 11.7*  HCT 37.7 34.5* 36.6  MCV 83.4 82.9 82.4  PLT 314 302 286   Cardiac Enzymes:  Recent Labs Lab 08/30/13 1623  TROPONINI <0.30   BNP: BNP (last 3 results)  Recent Labs  05/18/13 1240 09/01/13 0610  PROBNP 583.7* 378.5*   CBG:  Recent Labs Lab 08/31/13 0742 08/31/13 1136 08/31/13 1700 08/31/13 2012 09/01/13 0720  GLUCAP 210* 192* 243* 209* 126*    Time coordinating discharge: 45 minutes  Signed:  Nina Hoar  [facility] 09/01/2013, 10:30 AM

## 2013-09-01 NOTE — Progress Notes (Signed)
Patient discharged home in stable condition.  Home with HH.  No change from morning assessment.  Instructions provided to spouse.  Flu vaccine and potassium given prior to discharge

## 2013-09-01 NOTE — Progress Notes (Signed)
   CARE MANAGEMENT NOTE 09/01/2013  Patient:  MAESON, LOURENCO   Account Number:  [id number]  Date Initiated:  08/31/2013  Documentation initiated by:  McGIBBONEY,COOKIE  Subjective/Objective Assessment:   pt admitted with cco confusion, question overdose     Action/Plan:   from home   Anticipated DC Date:  09/01/2013   Anticipated DC Plan:  HOME/SELF CARE      DC Planning Services  CM consult  Medication Assistance      [facility] Choice  HOME HEALTH   Choice offered to / List presented to:  C-3 Spouse        HH arranged  HH-2 PT      HH agency  [facility]   Status of service:  Completed, signed off Medicare Important Message given?  NA - LOS <3 / Initial given by admissions (If response is "NO", the following Medicare IM given date fields will be blank) Date Medicare IM given:   Date Additional Medicare IM given:    Discharge Disposition:  HOME W HOME HEALTH SERVICES  Per UR Regulation:  Reviewed for med. necessity/level of care/duration of stay  If discussed at Long Length of Stay Meetings, dates discussed:    Comments:  09/01/2013 1210 NCM spoke to pt and offered choice for [facility]. Pt agreeable to [facility]. States she has oxygen with [facility]. Provided pt with applications for her different meds from [employer]. Spoke to husband and he gets assistance through several manufactures of her medications. Isidoro Donning RN CCM Case Mgmt phone [phone]   08/31/13 MMcGibboney, RN, BSN Chart reviewed.

## 2013-09-01 NOTE — Progress Notes (Signed)
Pt will not wear SCD's and is not on any pharmaceutical VTE prevention.

## 2013-09-04 NOTE — Telephone Encounter (Signed)
Left message for patient's husband to give [place] more details on patient's condition and what he wanted from the doctor.  I LM that we were aware she was in the hospital.

## 2013-09-04 NOTE — Telephone Encounter (Signed)
I reviewed her hospital records.  Is she now  my patient ?  She has had confusional spells and hypoxia before and would likely need a pulmonary evaluation in hospital .  That's best to do it while admitted  and to see how much 02 is needed and tolerated. Joyce Richardson

## 2013-09-05 ENCOUNTER — Inpatient Hospital Stay (HOSPITAL_COMMUNITY)
Admission: EM | Admit: 2013-09-05 | Discharge: 2013-09-06 | DRG: 917 | Disposition: A | Discharge status: Other Acute Inpatient Hospital | Payer: Medicare Other | Attending: Family Medicine | Admitting: Family Medicine

## 2013-09-05 ENCOUNTER — Encounter (HOSPITAL_COMMUNITY): Payer: Self-pay | Admitting: Emergency Medicine

## 2013-09-05 ENCOUNTER — Emergency Department (HOSPITAL_COMMUNITY): Payer: Medicare Other

## 2013-09-05 ENCOUNTER — Observation Stay (HOSPITAL_COMMUNITY): Payer: Medicare Other

## 2013-09-05 DIAGNOSIS — D72829 Elevated white blood cell count, unspecified: Secondary | ICD-10-CM

## 2013-09-05 DIAGNOSIS — T40601A Poisoning by unspecified narcotics, accidental (unintentional), initial encounter: Principal | ICD-10-CM

## 2013-09-05 DIAGNOSIS — E1142 Type 2 diabetes mellitus with diabetic polyneuropathy: Secondary | ICD-10-CM | POA: Diagnosis present

## 2013-09-05 DIAGNOSIS — F329 Major depressive disorder, single episode, unspecified: Secondary | ICD-10-CM | POA: Diagnosis present

## 2013-09-05 DIAGNOSIS — F29 Unspecified psychosis not due to a substance or known physiological condition: Secondary | ICD-10-CM | POA: Diagnosis present

## 2013-09-05 DIAGNOSIS — F1910 Other psychoactive substance abuse, uncomplicated: Secondary | ICD-10-CM | POA: Diagnosis present

## 2013-09-05 DIAGNOSIS — I498 Other specified cardiac arrhythmias: Secondary | ICD-10-CM

## 2013-09-05 DIAGNOSIS — Y92009 Unspecified place in unspecified non-institutional (private) residence as the place of occurrence of the external cause: Secondary | ICD-10-CM

## 2013-09-05 DIAGNOSIS — G9340 Encephalopathy, unspecified: Secondary | ICD-10-CM

## 2013-09-05 DIAGNOSIS — E119 Type 2 diabetes mellitus without complications: Secondary | ICD-10-CM | POA: Diagnosis present

## 2013-09-05 DIAGNOSIS — F3289 Other specified depressive episodes: Secondary | ICD-10-CM | POA: Diagnosis present

## 2013-09-05 DIAGNOSIS — T4271XA Poisoning by unspecified antiepileptic and sedative-hypnotic drugs, accidental (unintentional), initial encounter: Secondary | ICD-10-CM

## 2013-09-05 DIAGNOSIS — G8929 Other chronic pain: Secondary | ICD-10-CM | POA: Diagnosis present

## 2013-09-05 DIAGNOSIS — Z781 Physical restraint status: Secondary | ICD-10-CM | POA: Diagnosis present

## 2013-09-05 DIAGNOSIS — E1149 Type 2 diabetes mellitus with other diabetic neurological complication: Secondary | ICD-10-CM | POA: Diagnosis present

## 2013-09-05 DIAGNOSIS — Z6835 Body mass index (BMI) 35.0-35.9, adult: Secondary | ICD-10-CM

## 2013-09-05 DIAGNOSIS — Z9181 History of falling: Secondary | ICD-10-CM

## 2013-09-05 DIAGNOSIS — E876 Hypokalemia: Secondary | ICD-10-CM | POA: Diagnosis present

## 2013-09-05 DIAGNOSIS — J4489 Other specified chronic obstructive pulmonary disease: Secondary | ICD-10-CM | POA: Diagnosis present

## 2013-09-05 DIAGNOSIS — I5032 Chronic diastolic (congestive) heart failure: Secondary | ICD-10-CM | POA: Diagnosis present

## 2013-09-05 DIAGNOSIS — T426X4A Poisoning by other antiepileptic and sedative-hypnotic drugs, undetermined, initial encounter: Secondary | ICD-10-CM

## 2013-09-05 DIAGNOSIS — T394X2A Poisoning by antirheumatics, not elsewhere classified, intentional self-harm, initial encounter: Secondary | ICD-10-CM | POA: Diagnosis present

## 2013-09-05 DIAGNOSIS — G40909 Epilepsy, unspecified, not intractable, without status epilepticus: Secondary | ICD-10-CM | POA: Diagnosis present

## 2013-09-05 DIAGNOSIS — F1011 Alcohol abuse, in remission: Secondary | ICD-10-CM

## 2013-09-05 DIAGNOSIS — I959 Hypotension, unspecified: Secondary | ICD-10-CM

## 2013-09-05 DIAGNOSIS — F17200 Nicotine dependence, unspecified, uncomplicated: Secondary | ICD-10-CM | POA: Diagnosis present

## 2013-09-05 DIAGNOSIS — J449 Chronic obstructive pulmonary disease, unspecified: Secondary | ICD-10-CM | POA: Diagnosis present

## 2013-09-05 LAB — URINALYSIS, ROUTINE W REFLEX MICROSCOPIC
Glucose, UA: NEGATIVE mg/dL
Ketones, ur: NEGATIVE mg/dL
Leukocytes, UA: NEGATIVE
Nitrite: NEGATIVE
Specific Gravity, Urine: 1.028 (ref 1.005–1.030)
pH: 5 (ref 5.0–8.0)

## 2013-09-05 LAB — RAPID URINE DRUG SCREEN, HOSP PERFORMED
Amphetamines: NOT DETECTED
Benzodiazepines: NOT DETECTED
Cocaine: NOT DETECTED
Opiates: NOT DETECTED

## 2013-09-05 LAB — CBC WITH DIFFERENTIAL/PLATELET
Basophils Absolute: 0 10*3/uL (ref 0.0–0.1)
Eosinophils Relative: 0 % (ref 0–5)
HCT: 36.3 % (ref 36.0–46.0)
Hemoglobin: 11.6 g/dL — ABNORMAL LOW (ref 12.0–15.0)
Lymphocytes Relative: 11 % — ABNORMAL LOW (ref 12–46)
MCH: 26.5 pg (ref 26.0–34.0)
Monocytes Absolute: 1.3 10*3/uL — ABNORMAL HIGH (ref 0.1–1.0)
Neutro Abs: 15.1 10*3/uL — ABNORMAL HIGH (ref 1.7–7.7)
Neutrophils Relative %: 81 % — ABNORMAL HIGH (ref 43–77)
Platelets: 282 10*3/uL (ref 150–400)
RDW: 14.3 % (ref 11.5–15.5)
WBC: 18.6 10*3/uL — ABNORMAL HIGH (ref 4.0–10.5)

## 2013-09-05 LAB — GLUCOSE, CAPILLARY: Glucose-Capillary: 159 mg/dL — ABNORMAL HIGH (ref 70–99)

## 2013-09-05 LAB — BASIC METABOLIC PANEL
CO2: 24 mEq/L (ref 19–32)
Chloride: 94 mEq/L — ABNORMAL LOW (ref 96–112)
Creatinine, Ser: 0.77 mg/dL (ref 0.50–1.10)
GFR calc Af Amer: >90 mL/min (ref >90)
Potassium: 3.2 mEq/L — ABNORMAL LOW (ref 3.5–5.1)
Sodium: 131 mEq/L — ABNORMAL LOW (ref 135–145)

## 2013-09-05 LAB — SALICYLATE LEVEL: Salicylate Lvl: <2 mg/dL — ABNORMAL LOW (ref 2.8–20.0)

## 2013-09-05 LAB — ETHANOL: Alcohol, Ethyl (B): <11 mg/dL (ref 0–11)

## 2013-09-05 MED ORDER — LEVETIRACETAM ER 500 MG PO TB24
500.0000 mg | ORAL_TABLET | Freq: Two times a day (BID) | ORAL | Status: DC
  Administered 2013-09-06 (×2): 500 mg via ORAL
  Filled 2013-09-05 (×3): qty 1

## 2013-09-05 MED ORDER — ACETAMINOPHEN 325 MG PO TABS
650.0000 mg | ORAL_TABLET | Freq: Once | ORAL | Status: AC
  Administered 2013-09-05: 650 mg via ORAL
  Filled 2013-09-05: qty 2

## 2013-09-05 MED ORDER — SODIUM CHLORIDE 0.9 % IJ SOLN: 3.0000 mL | Freq: Two times a day (BID) | INTRAMUSCULAR | Status: DC

## 2013-09-05 MED ORDER — PYRIDOSTIGMINE BROMIDE 60 MG PO TABS
60.0000 mg | ORAL_TABLET | Freq: Two times a day (BID) | ORAL | Status: DC
  Administered 2013-09-06 (×2): 60 mg via ORAL
  Filled 2013-09-05 (×3): qty 1

## 2013-09-05 MED ORDER — MAGNESIUM OXIDE 400 (241.3 MG) MG PO TABS
200.0000 mg | ORAL_TABLET | Freq: Every day | ORAL | Status: DC
  Administered 2013-09-06: 08:00:00 200 mg via ORAL
  Filled 2013-09-05 (×2): qty 0.5

## 2013-09-05 MED ORDER — VENLAFAXINE HCL ER 150 MG PO CP24
150.0000 mg | ORAL_CAPSULE | Freq: Every day | ORAL | Status: DC
  Administered 2013-09-06: 12:00:00 150 mg via ORAL
  Filled 2013-09-05: qty 1

## 2013-09-05 MED ORDER — ACETAMINOPHEN 650 MG RE SUPP: 650.0000 mg | Freq: Four times a day (QID) | RECTAL | Status: DC | PRN

## 2013-09-05 MED ORDER — ONDANSETRON HCL 4 MG/2ML IJ SOLN: 4.0000 mg | Freq: Four times a day (QID) | INTRAMUSCULAR | Status: DC | PRN

## 2013-09-05 MED ORDER — INSULIN ASPART 100 UNIT/ML ~~LOC~~ SOLN
0.0000 [IU] | Freq: Three times a day (TID) | SUBCUTANEOUS | Status: DC
  Administered 2013-09-06: 3 [IU] via SUBCUTANEOUS
  Administered 2013-09-06: 08:00:00 2 [IU] via SUBCUTANEOUS

## 2013-09-05 MED ORDER — ONDANSETRON HCL 4 MG PO TABS: 4.0000 mg | ORAL_TABLET | Freq: Four times a day (QID) | ORAL | Status: DC | PRN

## 2013-09-05 MED ORDER — NICOTINE 14 MG/24HR TD PT24
14.0000 mg | MEDICATED_PATCH | Freq: Every day | TRANSDERMAL | Status: DC
  Filled 2013-09-05: qty 1

## 2013-09-05 MED ORDER — PANTOPRAZOLE SODIUM 40 MG PO TBEC
80.0000 mg | DELAYED_RELEASE_TABLET | Freq: Every day | ORAL | Status: DC
  Administered 2013-09-06: 80 mg via ORAL
  Filled 2013-09-05: qty 2

## 2013-09-05 MED ORDER — SODIUM CHLORIDE 0.9 % IV BOLUS (SEPSIS)
1000.0000 mL | Freq: Once | INTRAVENOUS | Status: AC
  Administered 2013-09-05: 1000 mL via INTRAVENOUS

## 2013-09-05 MED ORDER — ESTROGENS CONJUGATED 0.625 MG PO TABS
0.6250 mg | ORAL_TABLET | Freq: Every day | ORAL | Status: DC
  Administered 2013-09-06: 12:00:00 0.625 mg via ORAL
  Filled 2013-09-05: qty 1

## 2013-09-05 MED ORDER — INSULIN GLARGINE 100 UNIT/ML ~~LOC~~ SOLN
13.0000 [IU] | Freq: Every day | SUBCUTANEOUS | Status: DC
  Administered 2013-09-06: 13 [IU] via SUBCUTANEOUS
  Filled 2013-09-05 (×2): qty 0.13

## 2013-09-05 MED ORDER — ENOXAPARIN SODIUM 40 MG/0.4ML ~~LOC~~ SOLN
40.0000 mg | Freq: Every day | SUBCUTANEOUS | Status: DC
  Administered 2013-09-06: 01:00:00 40 mg via SUBCUTANEOUS
  Filled 2013-09-05 (×2): qty 0.4

## 2013-09-05 MED ORDER — ACETAMINOPHEN 325 MG PO TABS: 650.0000 mg | ORAL_TABLET | Freq: Four times a day (QID) | ORAL | Status: DC | PRN

## 2013-09-05 MED ORDER — ATORVASTATIN CALCIUM 10 MG PO TABS
10.0000 mg | ORAL_TABLET | Freq: Every day | ORAL | Status: DC
  Administered 2013-09-06: 10 mg via ORAL
  Filled 2013-09-05: qty 1

## 2013-09-05 MED ORDER — ALBUTEROL SULFATE (5 MG/ML) 0.5% IN NEBU: 2.5000 mg | INHALATION_SOLUTION | RESPIRATORY_TRACT | Status: DC | PRN

## 2013-09-05 MED ORDER — POTASSIUM CHLORIDE CRYS ER 20 MEQ PO TBCR
30.0000 meq | EXTENDED_RELEASE_TABLET | Freq: Once | ORAL | Status: AC
  Administered 2013-09-06: 01:00:00 30 meq via ORAL
  Filled 2013-09-05: qty 1

## 2013-09-05 MED ORDER — IPRATROPIUM BROMIDE 0.02 % IN SOLN: 0.5000 mg | RESPIRATORY_TRACT | Status: DC | PRN

## 2013-09-05 NOTE — ED Notes (Signed)
Per EMS- Patient's husband called EMS when he found his wife unresponsive. Upon arrival, EMS found the patient unresponsive and she had a Fentanyl patch 75 mcg in her mouth. EMS gave Narcan 2 mg nasally and patient woke up. Patient denied putting the patch in her mouth. Patient also c/o abdominal pain and feeling sleepy. Patient's husband states he normally locks the patches up and patient has eaten Fentanyl patches before.

## 2013-09-05 NOTE — ED Notes (Signed)
Bed: WA02 Expected date:  Expected time:  Means of arrival:  Comments: Chewed on fentanyl patch

## 2013-09-05 NOTE — H&P (Signed)
[facility] History and Physical  Joyce Richardson NWG:[id number] DOB: 1946/07/03 DOA: 09/05/2013   PCP: Astrid Divine, MD   Chief Complaint: unresponsive, syncope  HPI:  67 yo female h/o diastolic chf, polysubstance abuse with overdose in past, etoh abuse, sz disorder unclear if in setting of drug abuse, dm, htn brought in by EMS because she became unresponsive. The patient is a poor historian, and she is not forthcoming regarding her history. Much of this history is obtained by EDP and speaking with husband. The patient came home on the afternoon prior to admission from a day trip with her friends on the day of admission. As per the husband, while the patient was showing him some shirts that she bought  When she became increasingly lethargic while standing at the edge of the dining table. Her husband tried to get her to use a walker to get to the bedroom. EMS was activated. EMS found a 75 mcg fentanyl patch and the patient's mouth. An additional fentanyl patch was also found in the patient's purse. The patient's husband states that this has occurred in the past. The patient previously followed pain management for chronic pain. He has been weaned off of all her opioids one month ago. The patient was recently discharged from the hospital on 09/01/2013 from a similar episode. Prior to today's episode, the patient's husband states that the patient has been in her usual state of health. While in the ED, the patient denies any headache, visual disturbance, chest pain, shortness of breath, nausea, vomiting, abdominal pain, dysuria, hematuria. The patient is not forthcoming regarding previous opioid use or current use of her husbands fentanyl patches. She denies any suicidal or homicidal ideation  En route to ED, pt woke up with Narcan.  While in the ED, the patient has stayed awake and remains hemodynamically stable. No additional Narcan was necessary ED . Poison control Center was contacted  and they recommended observation of patient. The patient was found to be mildly hypo-natriuretic with a sodium 131, potassium 3.2, WBC 18.6. Urine drug screen was negative. Urinalysis did not show pyuria. Alcohol level was negative. EKG shows sinus rhythm.  Assessment/Plan:  delirium/syncope - Likely due to the patient's opioid overuse/abuse -Urine drug screen is negative which is typical with ingestion of fentanyl -Check TSH -Serum B12 -MRI brain 08/30/2013 negative for any infarction or hemorrhage or mass -MRA brain was negative -Discontinue all opioid and hypnotic medications -Place on telemetry -Denies HI/SI -previously seen by psych who recommended continue home antidepressants Leukocytosis -May be reactive due to her acute medical condition -She is afebrile and hemodynamically stable -Obtain chest x-ray-??aspiration -Urinalysis negative for pyuria Chronic diastolic CHF -Clinically, patient appears on the dry side -Hold HCTZ -Since the patient received 2 L normal saline in the ED, hold off on additional fluid at this time Hypokalemia -Replete -Check Mag Diabetes mellitus type 2 -Start half home dose Lantus -NovoLog sliding scale -09/01/2013 hemoglobin A1c 7.2 Tobacco abuse -Tobacco cessation discussed -Nicoderm patch Seizure disorder -Seizure-free at this time -Continue Keppra       Past Medical History  Diagnosis Date  . Multiple falls   . Orthostatic hypotension 05/17/12  . HYPERTENSION     hx. of; no longer on med. due to orthostatic hypotension  . ANXIETY   . Chronic diastolic heart failure   . DIABETES MELLITUS, TYPE II 2001    IDDM  . Carpal tunnel syndrome of right wrist 01/2013  . 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  Diabetic neuropathy     feet  . Hyperlipidemia   . History of substance abuse     Etoh and narcotics  . Diverticulosis   . DEPRESSION   . GERD   . SEIZURE DISORDER     no seizures in 3 yrs.  . Wears dentures      upper  . Sleep apnea 05/26/2012    sleep study:  AHI 7.5; RDI 10.7; CPAP not recommended  . 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 COPD (chronic obstructive pulmonary disease)     on home O2 at night, 2l/min.; denies SOB with daily activities   Past Surgical History  Procedure Laterality Date  . Hemorrhoid surgery    . Wrist fracture surgery Right 02/2012  . Appendectomy  1970's  . Vaginal hysterectomy    . Cataract extraction w/ intraocular lens  implant, bilateral Bilateral 2012  . Cholecystectomy  10/26/2000    lap. chole.  . Shoulder arthroscopy w/ subacromial decompression and distal clavicle excision Left 12/28/2011  . Hardware removal Right 02/21/2013    Procedure: REMOVAL DVR PLATE;  Surgeon: Nicki Reaper, MD;  Location: [facility];  Service: Orthopedics;  Laterality: Right;  . Carpal tunnel release Right 02/21/2013    Procedure: CARPAL TUNNEL RELEASE;  Surgeon: Nicki Reaper, MD;  Location: [facility];  Service: Orthopedics;  Laterality: Right;  . Wrist osteotomy Right 02/21/2013    Procedure: OSTEOTOMY ULNAR HEAD;  Surgeon: Nicki Reaper, MD;  Location: [facility];  Service: Orthopedics;  Laterality: Right;   Social History:  reports that she has been smoking Cigarettes.  She has been smoking about 0.00 packs per day for the past 30 years. She has never used smokeless tobacco. She reports that she does not drink alcohol or use illicit drugs.   Family History  Problem Relation Age of Onset  . Coronary artery disease Father      Allergies  Allergen Reactions  . Penicillins Swelling    OF LIPS  . Tegretol [Carbamazepine] Other (See Comments)    HYPONATREMIA      Prior to Admission medications   Medication Sig Start Date End Date Taking? Authorizing Provider  acetaminophen (TYLENOL) 325 MG tablet Take 2 tablets (650 mg total) by mouth every 6 (six) hours as needed. 05/19/13  Yes Christiane Ha, MD  albuterol (PROVENTIL)  (2.5 MG/3ML) 0.083% nebulizer solution Take 3 mLs (2.5 mg total) by nebulization every 6 (six) hours as needed for wheezing. 09/01/13  Yes Renae Fickle, MD  atorvastatin (LIPITOR) 10 MG tablet Take 10 mg by mouth daily. 10/26/11  Yes Madelin Headings, MD  CALCIUM-VITAMIN D PO Take 1 tablet by mouth daily with breakfast. Calcium 1200- Vitamin D1000 mg.   Yes Historical Provider, MD  clonazePAM (KLONOPIN) 0.25 MG disintegrating tablet Take 1 tablet (0.25 mg total) by mouth at bedtime as needed. 08/15/13   Porfirio Mylar Dohmeier, MD  estrogens, conjugated, (PREMARIN) 0.625 MG tablet Take 0.625 mg by mouth daily.    Madelin Headings, MD  hydrochlorothiazide (HYDRODIURIL) 12.5 MG tablet Take 1 tablet (12.5 mg total) by mouth daily. 09/01/13   Renae Fickle, MD  insulin glargine (LANTUS) 100 UNIT/ML injection Inject 26 Units into the skin at bedtime.  05/17/12   Standley Brooking, MD  ipratropium (ATROVENT) 0.02 % nebulizer solution Take 2.5 mLs (500 mcg total) by nebulization 4 (four) times daily. 09/01/13   Renae Fickle, MD  levETIRAcetam (KEPPRA XR) 500 MG 24  hr tablet Take 500 mg by mouth 2 (two) times daily.    Historical Provider, MD  Liraglutide 18 MG/3ML SOPN Inject into the skin. Inject 1.8 mg into the skin daily    Historical Provider, MD  Magnesium 250 MG TABS Take 250 mg by mouth daily with breakfast.    Historical Provider, MD  metFORMIN (GLUCOPHAGE-XR) 500 MG 24 hr tablet Take 1,500 mg by mouth daily at 12 noon.     Historical Provider, MD  Multiple Vitamins-Minerals (CENTRUM SILVER PO) Take 1 tablet by mouth daily.    Historical Provider, MD  nicotine (NICODERM CQ - DOSED IN MG/24 HOURS) 14 mg/24hr patch Place 1 patch onto the skin daily. 09/01/13   Renae Fickle, MD  omeprazole (PRILOSEC) 20 MG capsule Take 20 mg by mouth Daily.  09/22/11   Historical Provider, MD  OVER THE COUNTER MEDICATION Take 1 tablet by mouth as needed (allergies). Wal-itin. (Allergy Relief)    Historical Provider, MD   pregabalin (LYRICA) 300 MG capsule Take 2 capsules (600 mg total) by mouth at bedtime. 03/08/13   Huston Foley, MD  pyridostigmine (MESTINON) 60 MG tablet Take 60 mg by mouth 2 (two) times daily.    Historical Provider, MD  QUEtiapine (SEROQUEL) 300 MG tablet Take 300 mg by mouth at bedtime.    Historical Provider, MD  venlafaxine (EFFEXOR XR) 150 MG 24 hr capsule Take 150 mg by mouth daily.     Historical Provider, MD    Review of Systems:  Constitutional:  No weight loss, night sweats, Fevers, chills, fatigue.  Head&Eyes: No headache.  No vision loss.  No eye pain or scotoma ENT:  No Difficulty swallowing,Tooth/dental problems,Sore throat,   Cardio-vascular:  No chest pain, Orthopnea, PND, swelling in lower extremities,  dizziness, palpitations  GI:  No  abdominal pain, nausea, vomiting, diarrhea, loss of appetite, hematochezia,  Resp:  No shortness of breath with exertion or at rest. No cough. No coughing up of blood  Skin:  no rash or lesions.  GU:  no dysuria, change in color of urine, no urgency or frequency. No flank pain.  Musculoskeletal:  No joint pain or swelling. No decreased range of motion.  Psych:  No change in mood or affect.  Neurologic: No headache, no dysesthesia, no focal weakness, no vision loss.  Physical Exam: Filed Vitals:   09/05/13 1800 09/05/13 1804 09/05/13 1900 09/05/13 1958  BP: 123/59  111/53 95/42  Pulse: 96  92 87  Temp: 98.4 F (36.9 C)     TempSrc: Oral     Resp: 12  11 24   SpO2: 84% 96% 92% 99%   General:  A&O x 2, NAD, nontoxic, pleasant Head/Eye: No conjunctival hemorrhage, no icterus, [place]/AT, No nystagmus ENT:  No icterus,  No thrush, edentulous, no pharyngeal exudate Neck:  No masses, no lymphadenpathy, no meningismus CV:  RRR, no rub, no gallop, no S3 Lung:  Bibasilar crackles. No wheezing. Good air movement. Abdomen: soft/NT, +BS, nondistended, no peritoneal signs Ext: No cyanosis, No rashes, No petechiae, No lymphangitis,  trace LE edema Neuro: CNII-XII intact, strength 4-/5 in bilateral upper and lower extremities, no dysmetria  Labs on Admission:  Basic Metabolic Panel:  Recent Labs Lab 08/30/13 1533 08/31/13 0403 09/01/13 0610 09/05/13 1850  NA 131* 135 138 131*  K 4.9 4.2 3.4* 3.2*  CL 98 99 102 94*  CO2 22 28 27 24   GLUCOSE 259* 166* 113* 199*  BUN 18 11 12 18   CREATININE 0.77 0.60 0.64  0.77  CALCIUM 8.7 8.5 8.4 9.0   Liver Function Tests:  Recent Labs Lab 08/30/13 1623 08/31/13 0403 09/01/13 0610  AST 358* 266* 144*  ALT 150* 128* 101*  ALKPHOS 105 92 83  BILITOT 0.4 0.5 0.4  PROT 7.0 6.4 6.4  ALBUMIN 3.3* 2.9* 2.8*   No results found for this basename: LIPASE, AMYLASE,  in the last 168 hours No results found for this basename: AMMONIA,  in the last 168 hours CBC:  Recent Labs Lab 08/30/13 1533 08/31/13 0403 09/01/13 0610 09/05/13 1850  WBC 16.2* 14.1* 12.2* 18.6*  NEUTROABS 11.3*  --   --  15.1*  HGB 12.1 11.0* 11.7* 11.6*  HCT 37.7 34.5* 36.6 36.3  MCV 83.4 82.9 82.4 82.9  PLT 314 302 286 282   Cardiac Enzymes:  Recent Labs Lab 08/30/13 1623  TROPONINI <0.30   BNP: No components found with this basename: POCBNP,  CBG:  Recent Labs Lab 08/31/13 1136 08/31/13 1700 08/31/13 2012 09/01/13 0720 09/01/13 1128  GLUCAP 192* 243* 209* 126* 243*    Radiological Exams on Admission: Ct Head Wo Contrast  09/05/2013   CLINICAL DATA:  Altered mental status  EXAM: CT HEAD WITHOUT CONTRAST  TECHNIQUE: Contiguous axial images were obtained from the base of the skull through the vertex without intravenous contrast. Study was obtained within 24 hr of patient's arrival at the emergency department.  COMPARISON:  brain CT and brain MRI August 30, 2013  FINDINGS: There is moderate diffuse atrophy, stable. There is no mass, hemorrhage, extra-axial fluid collection, or midline shift. There is mild small vessel disease in the centra semiovale bilaterally, stable. There is no new  gray-white compartment lesion. No acute infarct apparent. The bony calvarium appears intact. The mastoid air cells are clear.  IMPRESSION: Atrophy with small vessel disease. No apparent mass, hemorrhage, or acute appearing infarct.   Electronically Signed   By: Bretta Bang M.D.   On: 09/05/2013 19:39    EKG: Independently reviewed.  Sinus rhythm, no ST-T wave change   Time spent:60 minutes Code Status:   FULL Family Communication:   No Family at bedside   Theran Vandergrift, DO  [facility] Pager [phone]  If 7PM-7AM, please contact night-coverage [email or web address] Password [email or web address] 09/05/2013, 9:06 PM

## 2013-09-05 NOTE — ED Provider Notes (Signed)
CSN: [id number]     Arrival date & time 09/05/13  1749 History   First MD Initiated Contact with Patient 09/05/13 1754     Chief Complaint  Patient presents with  . Medical Clearance  . Suicidal   (Consider location/radiation/quality/duration/timing/severity/associated sxs/prior Treatment) HPI  This is a 67 year old female with a history of polysubstance abuse and recent admission for concern for narcotic overdose who presents by EMS after being found unresponsive. The patient is a poor historian and noncontributory to history taking. She is alert but drowsy. She will answer yes and no questions. She denies a suicidal ideation or homicidal ideation. Per EMS report, patient was found unresponsive by her husband. Upon EMS for arrival she was noted to have a 75 mcg patch in her mouth. Patient was given 2 of Narcan with improvement of mental status. When asked why the patient was chewing on a simple past the patient states "I don't know."   Patient denies any other complaints to me. Per EMS report, the husband has to walk up medications at home. She has a history of benzo and narcotic abuse.  Chart review reveals recent admission for similar episode. At that time her UDS was negative and patient denied any ingestion.  Past Medical History  Diagnosis Date  . Multiple falls   . Orthostatic hypotension 05/17/12  . HYPERTENSION     hx. of; no longer on med. due to orthostatic hypotension  . ANXIETY   . Chronic diastolic heart failure   . DIABETES MELLITUS, TYPE II 2001    IDDM  . Carpal tunnel syndrome of right wrist 01/2013  . 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 Diabetic neuropathy     feet  . Hyperlipidemia   . History of substance abuse     Etoh and narcotics  . Diverticulosis   . DEPRESSION   . GERD   . SEIZURE DISORDER     no seizures in 3 yrs.  . Wears dentures     upper  . Sleep apnea 05/26/2012    sleep study:  AHI 7.5; RDI 10.7; CPAP not recommended  . Parasomnia  in conditions classified elsewhere   . COPD (chronic obstructive pulmonary disease)     on home O2 at night, 2l/min.; denies SOB with daily activities   Past Surgical History  Procedure Laterality Date  . Hemorrhoid surgery    . Wrist fracture surgery Right 02/2012  . Appendectomy  1970's  . Vaginal hysterectomy    . Cataract extraction w/ intraocular lens  implant, bilateral Bilateral 2012  . Cholecystectomy  10/26/2000    lap. chole.  . Shoulder arthroscopy w/ subacromial decompression and distal clavicle excision Left 12/28/2011  . Hardware removal Right 02/21/2013    Procedure: REMOVAL DVR PLATE;  Surgeon: Nicki Reaper, MD;  Location: [facility];  Service: Orthopedics;  Laterality: Right;  . Carpal tunnel release Right 02/21/2013    Procedure: CARPAL TUNNEL RELEASE;  Surgeon: Nicki Reaper, MD;  Location: [facility];  Service: Orthopedics;  Laterality: Right;  . Wrist osteotomy Right 02/21/2013    Procedure: OSTEOTOMY ULNAR HEAD;  Surgeon: Nicki Reaper, MD;  Location: [facility];  Service: Orthopedics;  Laterality: Right;   Family History  Problem Relation Age of Onset  . Coronary artery disease Father    History  Substance Use Topics  . Smoking status: Current Every Day Smoker -- 30 years    Types: Cigarettes  .  Smokeless tobacco: Never Used     Comment: 3 cig./day  . Alcohol Use: No     Comment: former hx. of Etoh abuse   OB History   Grav Para Term Preterm Abortions TAB SAB Ect Mult Living                 Review of Systems  Constitutional: Negative for fever.  Respiratory: Negative for chest tightness and shortness of breath.   Cardiovascular: Negative for chest pain.  Gastrointestinal: Negative for nausea, vomiting and abdominal pain.  All other systems reviewed and are negative.    Allergies  Penicillins and Tegretol  Home Medications   No current outpatient prescriptions on file. BP 120/65  Pulse 81  Temp(Src) 98.4 F  (36.9 C) (Oral)  Resp 18  Ht 5' (1.524 m)  Wt 180 lb 12.4 oz (82 kg)  BMI 35.31 kg/m2  SpO2 94% Physical Exam  Nursing note and vitals reviewed. Constitutional:  Somnolent but arousable  HENT:  Head: Normocephalic and atraumatic.  Mouth/Throat: Oropharynx is clear and moist.  Eyes:  Pupils 3 mm and reactive bilaterally  Neck: Neck supple.  Cardiovascular: Normal rate, regular rhythm and normal heart sounds.   No murmur heard. Pulmonary/Chest: Effort normal and breath sounds normal. No respiratory distress. She has no wheezes.  Abdominal: Soft. Bowel sounds are normal. She exhibits no distension. There is no tenderness.  Musculoskeletal: She exhibits no edema.  Neurological:  Somnolent but arousable, moves all 4 extremities, follows commands, no focal deficit noted  Skin: Skin is warm and dry.  Psychiatric:  Flat affect    ED Course  Procedures (including critical care time) Labs Review Labs Reviewed  CBC WITH DIFFERENTIAL - Abnormal; Notable for the following:    WBC 18.6 (*)    Hemoglobin 11.6 (*)    Neutrophils Relative % 81 (*)    Neutro Abs 15.1 (*)    Lymphocytes Relative 11 (*)    Monocytes Absolute 1.3 (*)    All other components within normal limits  BASIC METABOLIC PANEL - Abnormal; Notable for the following:    Sodium 131 (*)    Potassium 3.2 (*)    Chloride 94 (*)    Glucose, Bld 199 (*)    GFR calc non Af Amer 84 (*)    All other components within normal limits  SALICYLATE LEVEL - Abnormal; Notable for the following:    Salicylate Lvl <2.0 (*)    All other components within normal limits  GLUCOSE, CAPILLARY - Abnormal; Notable for the following:    Glucose-Capillary 159 (*)    All other components within normal limits  ETHANOL  URINALYSIS, ROUTINE W REFLEX MICROSCOPIC  URINE RAPID DRUG SCREEN (HOSP PERFORMED)  BASIC METABOLIC PANEL  CBC  TROPONIN I  TROPONIN I  MAGNESIUM  VITAMIN B12  TSH   Imaging Review Ct Head Wo Contrast  09/05/2013    CLINICAL DATA:  Altered mental status  EXAM: CT HEAD WITHOUT CONTRAST  TECHNIQUE: Contiguous axial images were obtained from the base of the skull through the vertex without intravenous contrast. Study was obtained within 24 hr of patient's arrival at the emergency department.  COMPARISON:  brain CT and brain MRI August 30, 2013  FINDINGS: There is moderate diffuse atrophy, stable. There is no mass, hemorrhage, extra-axial fluid collection, or midline shift. There is mild small vessel disease in the centra semiovale bilaterally, stable. There is no new gray-white compartment lesion. No acute infarct apparent. The bony calvarium appears intact.  The mastoid air cells are clear.  IMPRESSION: Atrophy with small vessel disease. No apparent mass, hemorrhage, or acute appearing infarct.   Electronically Signed   By: Bretta Bang M.D.   On: 09/05/2013 19:39    EKG Interpretation     Ventricular Rate:  96 PR Interval:  145 QRS Duration: 89 QT Interval:  362 QTC Calculation: 458 R Axis:   8 Text Interpretation:  Normal sinus rhythm Normal ECG            MDM   1. Narcotic overdose, initial encounter   2. Hypotension   3. Acute encephalopathy   4. Chronic diastolic heart failure   5. Leukocytosis, unspecified    This a 67 year old female who presents with acute narcotic ingestion. Patient was found she was on a fentanyl patch. She had 2 mg of Narcan in route with improvement of her mental status.  Patient will monitor elaborate on why she was chewing on a fentanyl patch. On my exam she is sleepy but arousable. Vital signs are reassuring at this time.  Poison control was contacted and patient will need at least 12 hour observation given duration of action. Rest of her workup is negative. She has not required any more Narcan while in the ER. She will be admitted to the step down unit.    Shon Baton, MD 09/06/13 [phone]

## 2013-09-06 ENCOUNTER — Encounter (HOSPITAL_COMMUNITY): Payer: Self-pay | Admitting: *Deleted

## 2013-09-06 ENCOUNTER — Inpatient Hospital Stay (HOSPITAL_COMMUNITY)
Admission: AD | Admit: 2013-09-06 | Discharge: 2013-09-12 | DRG: 885 | Disposition: A | Discharge status: Home / Self Care | Payer: No Typology Code available for payment source | Source: Intra-hospital | Attending: Psychiatry | Admitting: Psychiatry

## 2013-09-06 DIAGNOSIS — E119 Type 2 diabetes mellitus without complications: Secondary | ICD-10-CM

## 2013-09-06 DIAGNOSIS — I5033 Acute on chronic diastolic (congestive) heart failure: Secondary | ICD-10-CM

## 2013-09-06 DIAGNOSIS — M79601 Pain in right arm: Secondary | ICD-10-CM

## 2013-09-06 DIAGNOSIS — R0789 Other chest pain: Secondary | ICD-10-CM

## 2013-09-06 DIAGNOSIS — I951 Orthostatic hypotension: Secondary | ICD-10-CM

## 2013-09-06 DIAGNOSIS — J449 Chronic obstructive pulmonary disease, unspecified: Secondary | ICD-10-CM

## 2013-09-06 DIAGNOSIS — R635 Abnormal weight gain: Secondary | ICD-10-CM

## 2013-09-06 DIAGNOSIS — R030 Elevated blood-pressure reading, without diagnosis of hypertension: Secondary | ICD-10-CM

## 2013-09-06 DIAGNOSIS — G8921 Chronic pain due to trauma: Secondary | ICD-10-CM

## 2013-09-06 DIAGNOSIS — G4734 Idiopathic sleep related nonobstructive alveolar hypoventilation: Secondary | ICD-10-CM

## 2013-09-06 DIAGNOSIS — R252 Cramp and spasm: Secondary | ICD-10-CM

## 2013-09-06 DIAGNOSIS — J42 Unspecified chronic bronchitis: Secondary | ICD-10-CM

## 2013-09-06 DIAGNOSIS — Z8719 Personal history of other diseases of the digestive system: Secondary | ICD-10-CM

## 2013-09-06 DIAGNOSIS — R748 Abnormal levels of other serum enzymes: Secondary | ICD-10-CM

## 2013-09-06 DIAGNOSIS — J309 Allergic rhinitis, unspecified: Secondary | ICD-10-CM

## 2013-09-06 DIAGNOSIS — W06XXXA Fall from bed, initial encounter: Secondary | ICD-10-CM

## 2013-09-06 DIAGNOSIS — F329 Major depressive disorder, single episode, unspecified: Secondary | ICD-10-CM

## 2013-09-06 DIAGNOSIS — R52 Pain, unspecified: Secondary | ICD-10-CM

## 2013-09-06 DIAGNOSIS — G40909 Epilepsy, unspecified, not intractable, without status epilepticus: Secondary | ICD-10-CM | POA: Diagnosis present

## 2013-09-06 DIAGNOSIS — R350 Frequency of micturition: Secondary | ICD-10-CM

## 2013-09-06 DIAGNOSIS — J4489 Other specified chronic obstructive pulmonary disease: Secondary | ICD-10-CM | POA: Diagnosis present

## 2013-09-06 DIAGNOSIS — E669 Obesity, unspecified: Secondary | ICD-10-CM

## 2013-09-06 DIAGNOSIS — E785 Hyperlipidemia, unspecified: Secondary | ICD-10-CM

## 2013-09-06 DIAGNOSIS — E1149 Type 2 diabetes mellitus with other diabetic neurological complication: Secondary | ICD-10-CM | POA: Diagnosis present

## 2013-09-06 DIAGNOSIS — F17200 Nicotine dependence, unspecified, uncomplicated: Secondary | ICD-10-CM

## 2013-09-06 DIAGNOSIS — R202 Paresthesia of skin: Secondary | ICD-10-CM

## 2013-09-06 DIAGNOSIS — K219 Gastro-esophageal reflux disease without esophagitis: Secondary | ICD-10-CM

## 2013-09-06 DIAGNOSIS — F1911 Other psychoactive substance abuse, in remission: Secondary | ICD-10-CM

## 2013-09-06 DIAGNOSIS — F332 Major depressive disorder, recurrent severe without psychotic features: Principal | ICD-10-CM | POA: Diagnosis present

## 2013-09-06 DIAGNOSIS — I517 Cardiomegaly: Secondary | ICD-10-CM

## 2013-09-06 DIAGNOSIS — I10 Essential (primary) hypertension: Secondary | ICD-10-CM

## 2013-09-06 DIAGNOSIS — S299XXA Unspecified injury of thorax, initial encounter: Secondary | ICD-10-CM

## 2013-09-06 DIAGNOSIS — Z9189 Other specified personal risk factors, not elsewhere classified: Secondary | ICD-10-CM

## 2013-09-06 DIAGNOSIS — R569 Unspecified convulsions: Secondary | ICD-10-CM

## 2013-09-06 DIAGNOSIS — G4754 Parasomnia in conditions classified elsewhere: Secondary | ICD-10-CM

## 2013-09-06 DIAGNOSIS — R55 Syncope and collapse: Secondary | ICD-10-CM

## 2013-09-06 DIAGNOSIS — J209 Acute bronchitis, unspecified: Secondary | ICD-10-CM

## 2013-09-06 DIAGNOSIS — R5381 Other malaise: Secondary | ICD-10-CM

## 2013-09-06 DIAGNOSIS — D72829 Elevated white blood cell count, unspecified: Secondary | ICD-10-CM

## 2013-09-06 DIAGNOSIS — F1110 Opioid abuse, uncomplicated: Secondary | ICD-10-CM | POA: Diagnosis present

## 2013-09-06 DIAGNOSIS — Z9283 Personal history of failed moderate sedation: Secondary | ICD-10-CM

## 2013-09-06 DIAGNOSIS — F411 Generalized anxiety disorder: Secondary | ICD-10-CM

## 2013-09-06 DIAGNOSIS — F1910 Other psychoactive substance abuse, uncomplicated: Secondary | ICD-10-CM

## 2013-09-06 DIAGNOSIS — F1011 Alcohol abuse, in remission: Secondary | ICD-10-CM

## 2013-09-06 DIAGNOSIS — E1142 Type 2 diabetes mellitus with diabetic polyneuropathy: Secondary | ICD-10-CM | POA: Diagnosis present

## 2013-09-06 DIAGNOSIS — Z79899 Other long term (current) drug therapy: Secondary | ICD-10-CM

## 2013-09-06 DIAGNOSIS — F29 Unspecified psychosis not due to a substance or known physiological condition: Secondary | ICD-10-CM

## 2013-09-06 DIAGNOSIS — N390 Urinary tract infection, site not specified: Secondary | ICD-10-CM

## 2013-09-06 DIAGNOSIS — K589 Irritable bowel syndrome without diarrhea: Secondary | ICD-10-CM

## 2013-09-06 DIAGNOSIS — F1120 Opioid dependence, uncomplicated: Secondary | ICD-10-CM

## 2013-09-06 DIAGNOSIS — I5032 Chronic diastolic (congestive) heart failure: Secondary | ICD-10-CM | POA: Diagnosis present

## 2013-09-06 DIAGNOSIS — K5730 Diverticulosis of large intestine without perforation or abscess without bleeding: Secondary | ICD-10-CM

## 2013-09-06 DIAGNOSIS — Z86010 Personal history of colonic polyps: Secondary | ICD-10-CM

## 2013-09-06 DIAGNOSIS — R05 Cough: Secondary | ICD-10-CM

## 2013-09-06 DIAGNOSIS — I498 Other specified cardiac arrhythmias: Secondary | ICD-10-CM

## 2013-09-06 DIAGNOSIS — E1165 Type 2 diabetes mellitus with hyperglycemia: Secondary | ICD-10-CM

## 2013-09-06 LAB — CBC
HCT: 33.9 % — ABNORMAL LOW (ref 36.0–46.0)
Hemoglobin: 10.5 g/dL — ABNORMAL LOW (ref 12.0–15.0)
MCHC: 31 g/dL (ref 30.0–36.0)
RBC: 4.04 MIL/uL (ref 3.87–5.11)
RDW: 14.3 % (ref 11.5–15.5)
WBC: 10.2 10*3/uL (ref 4.0–10.5)

## 2013-09-06 LAB — GLUCOSE, CAPILLARY
Glucose-Capillary: 148 mg/dL — ABNORMAL HIGH (ref 70–99)
Glucose-Capillary: 125 mg/dL — ABNORMAL HIGH (ref 70–99)

## 2013-09-06 LAB — BASIC METABOLIC PANEL
CO2: 29 mEq/L (ref 19–32)
Chloride: 100 mEq/L (ref 96–112)
Creatinine, Ser: 0.72 mg/dL (ref 0.50–1.10)
GFR calc Af Amer: >90 mL/min (ref >90)
GFR calc non Af Amer: 86 mL/min — ABNORMAL LOW (ref >90)
Potassium: 3.6 mEq/L (ref 3.5–5.1)
Sodium: 137 mEq/L (ref 135–145)

## 2013-09-06 LAB — TROPONIN I: Troponin I: <0.3 ng/mL (ref <0.30)

## 2013-09-06 LAB — MAGNESIUM: Magnesium: 1.8 mg/dL (ref 1.5–2.5)

## 2013-09-06 LAB — VITAMIN B12: Vitamin B-12: 553 pg/mL (ref 211–911)

## 2013-09-06 LAB — TSH: TSH: 1.246 u[IU]/mL (ref 0.350–4.500)

## 2013-09-06 MED ORDER — VENLAFAXINE HCL ER 75 MG PO CP24: 75.0000 mg | ORAL_CAPSULE | Freq: Every day | ORAL | Status: DC

## 2013-09-06 MED ORDER — NICOTINE 14 MG/24HR TD PT24: 14.0000 mg | MEDICATED_PATCH | Freq: Every day | TRANSDERMAL | Status: DC

## 2013-09-06 MED ORDER — QUETIAPINE FUMARATE 200 MG PO TABS: 200.0000 mg | ORAL_TABLET | Freq: Every day | ORAL | Status: DC

## 2013-09-06 MED ORDER — CLONAZEPAM 0.25 MG PO TBDP: 0.2500 mg | ORAL_TABLET | Freq: Every day | ORAL | Status: DC

## 2013-09-06 MED ORDER — LORAZEPAM 0.5 MG PO TABS: 0.5000 mg | ORAL_TABLET | Freq: Two times a day (BID) | ORAL | Status: DC | PRN

## 2013-09-06 MED ORDER — ACETAMINOPHEN 325 MG PO TABS
650.0000 mg | ORAL_TABLET | Freq: Four times a day (QID) | ORAL | Status: DC | PRN
  Administered 2013-09-08: 650 mg via ORAL

## 2013-09-06 MED ORDER — MAGNESIUM HYDROXIDE 400 MG/5ML PO SUSP: 30.0000 mL | Freq: Every day | ORAL | Status: DC | PRN

## 2013-09-06 MED ORDER — HALOPERIDOL 2 MG PO TABS
2.5000 mg | ORAL_TABLET | Freq: Three times a day (TID) | ORAL | Status: DC
  Administered 2013-09-06 – 2013-09-07 (×2): 2.5 mg via ORAL
  Filled 2013-09-06 (×9): qty 1

## 2013-09-06 MED ORDER — CLONAZEPAM 0.5 MG PO TABS
0.2500 mg | ORAL_TABLET | Freq: Every evening | ORAL | Status: DC | PRN
  Administered 2013-09-09: 0.25 mg via ORAL
  Filled 2013-09-06: qty 1

## 2013-09-06 MED ORDER — HYDROXYZINE HCL 50 MG PO TABS
50.0000 mg | ORAL_TABLET | ORAL | Status: AC
  Administered 2013-09-06: 50 mg via ORAL
  Filled 2013-09-06: qty 1

## 2013-09-06 MED ORDER — HALOPERIDOL 1 MG PO TABS
1.0000 mg | ORAL_TABLET | Freq: Four times a day (QID) | ORAL | Status: DC | PRN
  Administered 2013-09-06: 11:00:00 1 mg via ORAL
  Filled 2013-09-06: qty 1

## 2013-09-06 MED ORDER — HYDROXYZINE HCL 50 MG PO TABS
50.0000 mg | ORAL_TABLET | Freq: Four times a day (QID) | ORAL | Status: DC
  Filled 2013-09-06 (×3): qty 1

## 2013-09-06 MED ORDER — CLONAZEPAM 0.5 MG PO TABS: 0.2500 mg | ORAL_TABLET | Freq: Every evening | ORAL | Status: DC | PRN

## 2013-09-06 MED ORDER — PREGABALIN 100 MG PO CAPS
600.0000 mg | ORAL_CAPSULE | Freq: Every day | ORAL | Status: DC
  Administered 2013-09-06 – 2013-09-11 (×6): 600 mg via ORAL
  Filled 2013-09-06 (×6): qty 6

## 2013-09-06 MED ORDER — PYRIDOSTIGMINE BROMIDE 60 MG PO TABS
60.0000 mg | ORAL_TABLET | Freq: Two times a day (BID) | ORAL | Status: DC
  Administered 2013-09-07 – 2013-09-12 (×11): 60 mg via ORAL
  Filled 2013-09-06 (×17): qty 1

## 2013-09-06 MED ORDER — ALUM & MAG HYDROXIDE-SIMETH 200-200-20 MG/5ML PO SUSP: 30.0000 mL | ORAL | Status: DC | PRN

## 2013-09-06 MED ORDER — LIRAGLUTIDE 18 MG/3ML ~~LOC~~ SOPN
1.8000 mg | PEN_INJECTOR | Freq: Every day | SUBCUTANEOUS | Status: DC
  Administered 2013-09-07 – 2013-09-09 (×2): 1.8 mg via SUBCUTANEOUS

## 2013-09-06 MED ORDER — HALOPERIDOL LACTATE 5 MG/ML IJ SOLN: 2.0000 mg | Freq: Three times a day (TID) | INTRAMUSCULAR | Status: DC

## 2013-09-06 MED ORDER — INSULIN GLARGINE 100 UNIT/ML ~~LOC~~ SOLN
13.0000 [IU] | Freq: Every day | SUBCUTANEOUS | Status: DC
  Administered 2013-09-06 – 2013-09-11 (×6): 13 [IU] via SUBCUTANEOUS

## 2013-09-06 MED ORDER — LEVETIRACETAM ER 500 MG PO TB24
500.0000 mg | ORAL_TABLET | Freq: Two times a day (BID) | ORAL | Status: DC
  Filled 2013-09-06 (×2): qty 1

## 2013-09-06 MED ORDER — NON FORMULARY
1.8000 mg | Freq: Every day | Status: DC
  Administered 2013-09-06: 1.8 mg via SUBCUTANEOUS

## 2013-09-06 MED ORDER — QUETIAPINE FUMARATE 200 MG PO TABS
200.0000 mg | ORAL_TABLET | Freq: Every day | ORAL | Status: DC
  Administered 2013-09-06 – 2013-09-09 (×4): 200 mg via ORAL
  Filled 2013-09-06 (×6): qty 1

## 2013-09-06 MED ORDER — PANTOPRAZOLE SODIUM 40 MG PO TBEC
80.0000 mg | DELAYED_RELEASE_TABLET | Freq: Every day | ORAL | Status: DC
  Administered 2013-09-07 – 2013-09-12 (×6): 80 mg via ORAL
  Filled 2013-09-06 (×8): qty 2

## 2013-09-06 MED ORDER — ESTROGENS CONJUGATED 0.625 MG PO TABS
0.6250 mg | ORAL_TABLET | Freq: Every day | ORAL | Status: DC
  Administered 2013-09-07 – 2013-09-12 (×6): 0.625 mg via ORAL
  Filled 2013-09-06 (×8): qty 1

## 2013-09-06 MED ORDER — QUETIAPINE FUMARATE 200 MG PO TABS
200.0000 mg | ORAL_TABLET | Freq: Every day | ORAL | Status: DC
Start: 2013-09-06 — End: 2013-09-06
  Filled 2013-09-06: qty 1

## 2013-09-06 MED ORDER — INSULIN ASPART 100 UNIT/ML ~~LOC~~ SOLN
0.0000 [IU] | Freq: Three times a day (TID) | SUBCUTANEOUS | Status: DC
  Administered 2013-09-07: 3 [IU] via SUBCUTANEOUS
  Administered 2013-09-07: 5 [IU] via SUBCUTANEOUS
  Administered 2013-09-08: 3 [IU] via SUBCUTANEOUS
  Administered 2013-09-08: 2 [IU] via SUBCUTANEOUS
  Administered 2013-09-08 – 2013-09-09 (×3): 5 [IU] via SUBCUTANEOUS
  Administered 2013-09-09: 2 [IU] via SUBCUTANEOUS
  Administered 2013-09-10 (×2): 3 [IU] via SUBCUTANEOUS
  Administered 2013-09-10: 2 [IU] via SUBCUTANEOUS
  Administered 2013-09-11: 5 [IU] via SUBCUTANEOUS
  Administered 2013-09-11: 3 [IU] via SUBCUTANEOUS
  Administered 2013-09-11: 5 [IU] via SUBCUTANEOUS
  Administered 2013-09-12: 3 [IU] via SUBCUTANEOUS

## 2013-09-06 MED ORDER — ATORVASTATIN CALCIUM 10 MG PO TABS
10.0000 mg | ORAL_TABLET | Freq: Every day | ORAL | Status: DC
  Administered 2013-09-07 – 2013-09-12 (×6): 10 mg via ORAL
  Filled 2013-09-06 (×8): qty 1

## 2013-09-06 MED ORDER — PREGABALIN 75 MG PO CAPS: 600.0000 mg | ORAL_CAPSULE | Freq: Every day | ORAL | Status: DC

## 2013-09-06 MED ORDER — NICOTINE 14 MG/24HR TD PT24
14.0000 mg | MEDICATED_PATCH | Freq: Every day | TRANSDERMAL | Status: DC
  Administered 2013-09-08 – 2013-09-12 (×5): 14 mg via TRANSDERMAL
  Filled 2013-09-06 (×9): qty 1

## 2013-09-06 MED ORDER — LEVETIRACETAM ER 500 MG PO TB24
500.0000 mg | ORAL_TABLET | Freq: Two times a day (BID) | ORAL | Status: DC
  Administered 2013-09-06 – 2013-09-12 (×12): 500 mg via ORAL
  Filled 2013-09-06 (×2): qty 1
  Filled 2013-09-06: qty 4
  Filled 2013-09-06 (×2): qty 1
  Filled 2013-09-06: qty 4
  Filled 2013-09-06 (×4): qty 1
  Filled 2013-09-06: qty 4
  Filled 2013-09-06 (×2): qty 1
  Filled 2013-09-06: qty 4
  Filled 2013-09-06 (×6): qty 1

## 2013-09-06 MED ORDER — HALOPERIDOL 2 MG PO TABS
2.0000 mg | ORAL_TABLET | Freq: Three times a day (TID) | ORAL | Status: DC
  Filled 2013-09-06 (×3): qty 1.5

## 2013-09-06 MED ORDER — HALOPERIDOL 1 MG PO TABS: 1.0000 mg | ORAL_TABLET | Freq: Four times a day (QID) | ORAL | Status: DC | PRN

## 2013-09-06 MED ORDER — VENLAFAXINE HCL ER 75 MG PO CP24
75.0000 mg | ORAL_CAPSULE | Freq: Every day | ORAL | Status: DC
Start: 2013-09-07 — End: 2013-09-10
  Administered 2013-09-07 – 2013-09-10 (×4): 75 mg via ORAL
  Filled 2013-09-06 (×6): qty 1

## 2013-09-06 MED ORDER — HYDROXYZINE HCL 50 MG PO TABS
50.0000 mg | ORAL_TABLET | Freq: Four times a day (QID) | ORAL | Status: DC
  Administered 2013-09-06 – 2013-09-11 (×9): 50 mg via ORAL
  Filled 2013-09-06 (×35): qty 1

## 2013-09-06 MED ORDER — MAGNESIUM OXIDE 400 (241.3 MG) MG PO TABS
200.0000 mg | ORAL_TABLET | Freq: Every day | ORAL | Status: DC
  Administered 2013-09-07 – 2013-09-12 (×6): 200 mg via ORAL
  Filled 2013-09-06 (×8): qty 0.5

## 2013-09-06 MED ORDER — HALOPERIDOL LACTATE 5 MG/ML IJ SOLN: 1.0000 mg | Freq: Four times a day (QID) | INTRAMUSCULAR | Status: DC | PRN

## 2013-09-06 NOTE — Progress Notes (Signed)
Clinical Social Work  Patient accepted to [facility] [id number]. RN to call report to [phone]. CSW spent significant time with husband explaining process and patient's needs. CSW then met with husband and patient to explain process. Patient upset and reports she does not want to go. CSW explained that patient is under IVC and will have to go to [facility] at [place]. CSW coordinated transportation via [employer]. CSW is signing off but available if needed.  [place], [place] [phone]

## 2013-09-06 NOTE — Progress Notes (Signed)
D: Pt denies SI/HI/AVH/pain. Pt is pleasant and cooperative. Pt stayed in bed most of the evening. Pt stated she was tired, but was appropriate.   A: Pt was offered support and encouragement. Pt was given scheduled medications. Pt was encourage to attend groups. Q 15 minute checks were done for safety.   R: Pt is taking medication. Pt has no complaints.Pt receptive to treatment and safety maintained on unit.

## 2013-09-06 NOTE — Progress Notes (Signed)
Clinical Social Work  CSW reviewed chart and saw no IVC papers were served. CSW assisted MD in completing forms and faxed to Magistrate who confirmed papers were received. Full assessment to follow.  [place], [place] [phone]

## 2013-09-06 NOTE — Progress Notes (Signed)
Shift event:  Brief HPI: Pt admitted tonight after an opoid overdose. Was unresponsive at home after chewing a Fentanyl patch. In ED, was a poor historian due to mental status. Diagnosed with delerium secondary to OD. Was admitted for evaluation and treatment. She has multiple comorbidities including; DM, seizure disorder, ETOH and other substance abuse, OD in the past and psych disorder.  RN paged NP because pt had taken out her IV and taken off telemetry leads and was walking the hallway stating she was leaving. [employer] and [facility] security present. NP to unit.  Pt is sitting near elevators threatening to leave. She is beligerant and cursing one moment, and at other times, laughing. Her mood fluctuates in a matter of seconds. She says her husband is down stairs to take her home which is not true. Pt was given multiple chances to return to her room without intervention of security. However, she did not cooperate. [employer] and security officer escorted pt back to her room.  At this point, examination by Dr. Conley Rolls was accomplished and IVC papers were completed and signed by Dr. Houston Siren. Pt deemed unable to make sound decisions at this time and is mentally ill with substance abuse diagnosis and is a danger to herself at this time. Papers faxed to magistrate.

## 2013-09-06 NOTE — Progress Notes (Signed)
Clinical Social Work  Per psych MD, patient needs inpatient treatment. Attending MD reports patient is medically stable. CSW will fax out information once notes are available in chart for facilities to review.  [place], [place] [phone]

## 2013-09-06 NOTE — Progress Notes (Signed)
Pt became increasingly agitated and restless. Removed Telemetry monitor and refused to have it replaced. Pt became adamant about wanting to go home. Pt refused IV once again and telemetry. Pt  Called husband to come and pick her up. Not sure if pt spoke with husband....the patient appear confused at time and had poor judgment. Reviewed safety measures with patient. Pt walked to elevator attempting to get on. Immediately the [facility], Security and [employer], NP arrived to the floor. All staff attempted the talk with bed and to get her to return to her room. She refused. The team stayed with her trying to rationalize with her for about 30 to 45 minutes. Order received to place pt in 4 point restraints. Pt was very belligerent and agitated. Refused to take meds. Restraints were placed--[employer] and Security, CN--[facility] Teaching laboratory technician on placing the restraints. Pt did not have any bruising or skin tear noted. Dr. Loetta Rough arrived to the department to assess and check the patient..NP wrote orders for restraints and pt has been Involuntarily Committed---pt faxed to [employer] office. Will continue to monitor patient. SRP, RN

## 2013-09-06 NOTE — Progress Notes (Signed)
Report called to [facility], [place].  Spoke with Tax adviser.  Per SW, [employer] in route to transfer patient.

## 2013-09-06 NOTE — Progress Notes (Signed)
Nutrition Brief Note  Patient identified on the Malnutrition Screening Tool (MST) Report  Wt Readings from Last 15 Encounters:  09/05/13 180 lb 12.4 oz (82 kg)  09/01/13 180 lb 3.2 oz (81.738 kg)  08/15/13 188 lb (85.276 kg)  06/28/13 177 lb (80.287 kg)  05/19/13 158 lb 11.7 oz (72 kg)  03/15/13 180 lb (81.647 kg)  02/22/13 179 lb (81.194 kg)  02/21/13 180 lb (81.647 kg)  02/21/13 180 lb (81.647 kg)  02/13/13 173 lb 6 oz (78.642 kg)  02/13/13 173 lb 6 oz (78.642 kg)  12/13/12 186 lb (84.369 kg)  11/20/12 179 lb (81.194 kg)  08/21/12 180 lb 12.8 oz (82.01 kg)  07/12/12 180 lb (81.647 kg)    Body mass index is 35.31 kg/(m^2). Patient meets criteria for Obesity based on current BMI. RN requests that pt not be disturbed as she is finally resting. Weight history shows pt's weight has been stable at 180 lbs for over 1 year. Per RN pt ate well at breakfast.  Current diet order is Heart Healthy, patient is consuming approximately 75% of meals at this time. Labs and medications reviewed.   No nutrition interventions warranted at this time. If nutrition issues arise, please consult RD.   Ian Malkin RD, LDN Inpatient Clinical Dietitian Pager: [phone] After Hours Pager: [phone]

## 2013-09-06 NOTE — Progress Notes (Signed)
Patient calm and relaxed in the bed.  Patient currently in 4 point restraints. Circulation checked. Patient was apparently very hostile and refusing care/meds/tele. Spoke to patient regarding restraints and told her that we could remove them for now since there is a Recruitment consultant at the bedside and patient is calm and cooperative.  Will continue to monitor and assess need for restraints.

## 2013-09-06 NOTE — Progress Notes (Signed)
Clinical Social Work Department CLINICAL SOCIAL WORK PSYCHIATRY SERVICE LINE ASSESSMENT 09/06/2013  Patient:  Joyce Richardson  Account:  [id number]  Admit Date:  09/05/2013  Clinical Social Worker:  Unk Lightning, LCSW  Date/Time:  09/06/2013 10:00 AM Referred by:  Physician  Date referred:  09/06/2013 Reason for Referral  Psychosocial assessment   Presenting Symptoms/Problems (In the person's/family's own words):   Psych consulted after overdosing on pain medication.   Abuse/Neglect/Trauma History (check all that apply)  Denies history   Abuse/Neglect/Trauma Comments:   N/A   Psychiatric History (check all that apply)  Outpatient treatment   Psychiatric medications:  Haldol 2-3 mg  Effexor 150 mg   Current Mental Health Hospitalizations/Previous Mental Health History:   Patient reports she was diagnosed with depression and has been receiving medication management on outpatient basis. Patient reports she does not currently receive any therapy and is not interested in that service.   Current provider:   Dr. Andee Poles   Place and Date:   [place], [place]   Current Medications:   Scheduled Meds:      . atorvastatin  10 mg Oral Daily  . enoxaparin (LOVENOX) injection  40 mg Subcutaneous QHS  . estrogens (conjugated)  0.625 mg Oral Daily  . haloperidol  2-3 mg Oral TID   Or     . haloperidol lactate  2-3 mg Intramuscular TID  . hydrOXYzine  50 mg Oral Q6H  . insulin aspart  0-15 Units Subcutaneous TID WC  . insulin glargine  13 Units Subcutaneous QHS  . levETIRAcetam  500 mg Oral BID  . magnesium oxide  200 mg Oral Q breakfast  . nicotine  14 mg Transdermal Daily  . pantoprazole  80 mg Oral Daily  . pyridostigmine  60 mg Oral BID  . sodium chloride  3 mL Intravenous Q12H  . venlafaxine XR  150 mg Oral Daily        Continuous Infusions:      PRN Meds:.acetaminophen, acetaminophen, albuterol, haloperidol, ipratropium, LORazepam, ondansetron (ZOFRAN) IV,  ondansetron       Previous Impatient Admission/Date/Reason:   None reported   Emotional Health / Current Symptoms    Suicide/Self Harm  None reported   Suicide attempt in the past:   Patient denies that this admission was related to an attempt to harm herself. Patient denies any previous suicide attempts and reports no current SI or HI.   Other harmful behavior:   None reported   Psychotic/Dissociative Symptoms  None reported   Other Psychotic/Dissociative Symptoms:   Patient denies any symptoms.    Attention/Behavioral Symptoms  Within Normal Limits   Other Attention / Behavioral Symptoms:   Patient engaged throughout assessment.    Cognitive Impairment  Within Normal Limits   Other Cognitive Impairment:   Patient alert and oriented.    Mood and Adjustment  Mood Congruent    Stress, Anxiety, Trauma, Any Recent Loss/Stressor  Other - See comment   Anxiety (frequency):   N/A   Phobia (specify):   N/A   Compulsive behavior (specify):   N/A   Obsessive behavior (specify):   N/A   Other:   Patient reports that she and husband are going to be moving to [place] soon. Patient reports husband has MS and she cares for him.   Substance Abuse/Use  Current substance use   SBIRT completed (please refer for detailed history):  N  Self-reported substance use:   Patient denies any current or history of substance use but  per chart review, patient is chronic substance abuser. Patient did not wish to complete SBIRT and reports that she does not use any substances. Patient reports she uses all medications as prescribed.   Urinary Drug Screen Completed:  Y Alcohol level:   <11    Environmental/Housing/Living Arrangement  Stable housing   Who is in the home:   Husband   Emergency contact:  Ron-husband   Financial  Medicare   Patient's Strengths and Goals (patient's own words):   Patient reports supportive family.   Clinical Social Worker's Interpretive  Summary:   CSW received referral in order to complete psychosocial assessment. CSW reviewed chart and met with patient at bedside. Per chart review, IVC was never served by [facility] so CSW refaxed information and called Magistrate to confirm paperwork was received.    CSW introduced myself to patient and explained role. Patient reports she was at home with husband and had a terrible headache. Patient reports she tried taking pain medication but it was not working so she took some of her husband's medication. Patient denies this was an attempt to harm herself and that she was just in pain.    Patient is not open to discussing substance use and denies that she ever abuses prescription medications. Patient states that she has never had any addiction to substances and does not wish to discuss use. Per chart review, there is concern that patient is a substance abuser.    Patient reports that she lives at home with husband who was diagnosed with MS. Patient is husband's primary caregiver and reports stress with caring for him. Patient has two dts and reports she plans to move to [place] to be closer to one dtr. Patient reports that she and husband have been depressed since husband's MS diagnosis and both see Dr. Nolen Mu for medication management. Patient reports she and husband are prescribed Effexor and feels it is beneficial. Patient reports prior to taking medications she was depressed everyday, very tearful, and isolating often. Patient feels that medication helps her have more hope and energy. Patient does not receive any therapy and is not interested in any therapy or support groups at this time.    Patient agreeable for CSW to talk with husband to gather collateral information. CSW called husband and left a message. CSW is awaiting a return phone call.    CSW will continue to follow and will assist with psych MD recommendations.   Disposition:  Recommend Psych CSW continuing to support while in  hospital  [place], [place] [phone]

## 2013-09-06 NOTE — Consult Note (Signed)
Joyce Richardson Face-to-Face Psychiatry Consult   Reason for Consult:  Overdose on her pain medication , unresponsive.   Referring Physician:  Dr. Ezequiel Kayser is an 67 y.o. female.  Assessment: AXIS I:  Psychotic Disorder NOS AXIS II:  Deferred AXIS III:   Past Medical History  Diagnosis Date  . Multiple falls   . Orthostatic hypotension 05/17/12  . HYPERTENSION     hx. of; no longer on med. due to orthostatic hypotension  . ANXIETY   . Chronic diastolic heart failure   . DIABETES MELLITUS, TYPE II 2001    IDDM  . Carpal tunnel syndrome of right wrist 01/2013  . 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 Diabetic neuropathy     feet  . Hyperlipidemia   . History of substance abuse     Etoh and narcotics  . Diverticulosis   . DEPRESSION   . GERD   . SEIZURE DISORDER     no seizures in 3 yrs.  . Wears dentures     upper  . Sleep apnea 05/26/2012    sleep study:  AHI 7.5; RDI 10.7; CPAP not recommended  . 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 COPD (chronic obstructive pulmonary disease)     on home O2 at night, 2l/min.; denies SOB with daily activities   AXIS IV:  other psychosocial or environmental problems and problems related to social environment AXIS V:  1-10 persistent dangerousness to self and others present  Plan:  Recommend psychiatric Inpatient admission when medically cleared.  Subjective:   Joyce Richardson is a 67 y.o. female patient admitted with confusion.  HPI:  This is a 67 year old female who was recently discharged from medical floor , admitted again after taking the overdose on her pain medication.  Patient was seen on October 10 with same circumstances however she was discharged .  The patient remains very labile, guarded and minimize her condition.  She was found unresponsive by her husband who called EMS.  EMS from 75 mcg fentanyl patch in patient's mouth.  I spoke to her husband , who endorsed the patient has lot of stress and  past few months.  Couple is trying to sell their house .  Patient is also trying to sell her belongings , painting which is very stressful for the patient.  She is not sleeping very well.  It is difficult to manage on the medical floor.  She continued to have irritability, labile mood , cursing and yelling to her staff.  1 minute she is very pleasant and the next minute she is very hostile and angry.  She is seeing Dr. Emerson Monte who is giving her Effexor XR 150 mg.  As per husband she supposed to take Seroquel but she does not take Seroquel regularly.  Patient does not believe she has any psychiatric illness.  She minimizes her stressor however during the interview she started crying , refused to talk and then she started laughing.  Patient denies any hallucination or paranoia but appears very labile and not forthcoming.  She denied that it was a suicidal attempt , she told me that she came to the hospital because of the leg pain however when I confronted about pain medication , patient became more upset and told that it's none of my business.  Husband is a concern about patient's behavior.  The patient requires inpatient psychiatric treatment for the stability of mood and safety.  HPI Elements:  Location:  Medical floor. Quality:  poor. Severity:  Moderate.  Past Psychiatric History: Past Medical History  Diagnosis Date  . Multiple falls   . Orthostatic hypotension 05/17/12  . HYPERTENSION     hx. of; no longer on med. due to orthostatic hypotension  . ANXIETY   . Chronic diastolic heart failure   . DIABETES MELLITUS, TYPE II 2001    IDDM  . Carpal tunnel syndrome of right wrist 01/2013  . 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 Diabetic neuropathy     feet  . Hyperlipidemia   . History of substance abuse     Etoh and narcotics  . Diverticulosis   . DEPRESSION   . GERD   . SEIZURE DISORDER     no seizures in 3 yrs.  . Wears dentures     upper  . Sleep apnea 05/26/2012     sleep study:  AHI 7.5; RDI 10.7; CPAP not recommended  . 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 COPD (chronic obstructive pulmonary disease)     on home O2 at night, 2l/min.; denies SOB with daily activities    reports that she has been smoking Cigarettes.  She has been smoking about 0.00 packs per day for the past 30 years. She has never used smokeless tobacco. She reports that she does not drink alcohol or use illicit drugs. Family History  Problem Relation Age of Onset  . Coronary artery disease Father      Living Arrangements: Spouse/significant other   Abuse/Neglect [facility]) Physical Abuse: Denies Verbal Abuse: Denies Sexual Abuse: Denies Allergies:   Allergies  Allergen Reactions  . Penicillins Swelling    OF LIPS  . Tegretol [Carbamazepine] Other (See Comments)    HYPONATREMIA    ACT Assessment Complete:  No:   Past Psychiatric History: Diagnosis:  Unknown   Hospitalizations:  Unknown   Outpatient Care:  Unknown   Substance Abuse Care:  Unknown   Self-Mutilation:  Unknown   Suicidal Attempts:  Unknown   Homicidal Behaviors:  Unknown    Violent Behaviors:  Unknown    Place of Residence:  Lives with her husband Marital Status:  Married Employed/Unemployed:  Unknown Education:  Unknown Family Supports:  Husband is supportive Objective: Blood pressure 132/78, pulse 71, temperature 98.4 F (36.9 C), temperature source Oral, resp. rate 18, height 5' (1.524 m), weight 180 lb 12.4 oz (82 kg), SpO2 94.00%.Body mass index is 35.31 kg/(m^2). Results for orders placed during the hospital encounter of 09/05/13 (from the past 72 hour(s))  ETHANOL     Status: None   Collection Time    09/05/13  6:50 PM      Result Value Range   Alcohol, Ethyl (B) <11  0 - 11 mg/dL   Comment:            LOWEST DETECTABLE LIMIT FOR     SERUM ALCOHOL IS 11 mg/dL     FOR MEDICAL PURPOSES ONLY  CBC WITH DIFFERENTIAL     Status: Abnormal   Collection Time    09/05/13  6:50 PM       Result Value Range   WBC 18.6 (*) 4.0 - 10.5 K/uL   RBC 4.38  3.87 - 5.11 MIL/uL   Hemoglobin 11.6 (*) 12.0 - 15.0 g/dL   HCT 96.0  45.4 - 09.8 %   MCV 82.9  78.0 - 100.0 fL   MCH 26.5  26.0 - 34.0 pg   MCHC 32.0  30.0 - 36.0 g/dL   RDW 60.4  54.0 - 98.1 %   Platelets 282  150 - 400 K/uL   Neutrophils Relative % 81 (*) 43 - 77 %   Neutro Abs 15.1 (*) 1.7 - 7.7 K/uL   Lymphocytes Relative 11 (*) 12 - 46 %   Lymphs Abs 2.1  0.7 - 4.0 K/uL   Monocytes Relative 7  3 - 12 %   Monocytes Absolute 1.3 (*) 0.1 - 1.0 K/uL   Eosinophils Relative 0  0 - 5 %   Eosinophils Absolute 0.1  0.0 - 0.7 K/uL   Basophils Relative 0  0 - 1 %   Basophils Absolute 0.0  0.0 - 0.1 K/uL  BASIC METABOLIC PANEL     Status: Abnormal   Collection Time    09/05/13  6:50 PM      Result Value Range   Sodium 131 (*) 135 - 145 mEq/L   Potassium 3.2 (*) 3.5 - 5.1 mEq/L   Chloride 94 (*) 96 - 112 mEq/L   CO2 24  19 - 32 mEq/L   Glucose, Bld 199 (*) 70 - 99 mg/dL   BUN 18  6 - 23 mg/dL   Creatinine, Ser 1.91  0.50 - 1.10 mg/dL   Calcium 9.0  8.4 - 47.8 mg/dL   GFR calc non Af Amer 84 (*) >90 mL/min   GFR calc Af Amer >90  >90 mL/min   Comment: (NOTE)     The eGFR has been calculated using the CKD EPI equation.     This calculation has not been validated in all clinical situations.     eGFR's persistently <90 mL/min signify possible Chronic Kidney     Disease.  SALICYLATE LEVEL     Status: Abnormal   Collection Time    09/05/13  6:50 PM      Result Value Range   Salicylate Lvl <2.0 (*) 2.8 - 20.0 mg/dL  URINALYSIS, ROUTINE W REFLEX MICROSCOPIC     Status: None   Collection Time    09/05/13  7:45 PM      Result Value Range   Color, Urine YELLOW  YELLOW   APPearance CLEAR  CLEAR   Specific Gravity, Urine 1.028  1.005 - 1.030   pH 5.0  5.0 - 8.0   Glucose, UA NEGATIVE  NEGATIVE mg/dL   Hgb urine dipstick NEGATIVE  NEGATIVE   Bilirubin Urine NEGATIVE  NEGATIVE   Ketones, ur NEGATIVE  NEGATIVE mg/dL    Protein, ur NEGATIVE  NEGATIVE mg/dL   Urobilinogen, UA 0.2  0.0 - 1.0 mg/dL   Nitrite NEGATIVE  NEGATIVE   Leukocytes, UA NEGATIVE  NEGATIVE   Comment: LESS THAN 10 mL OF URINE SUBMITTED     MICROSCOPIC NOT DONE ON URINES WITH NEGATIVE PROTEIN, BLOOD, LEUKOCYTES, NITRITE, OR GLUCOSE <1000 mg/dL.  URINE RAPID DRUG SCREEN (HOSP PERFORMED)     Status: None   Collection Time    09/05/13  7:45 PM      Result Value Range   Opiates NONE DETECTED  NONE DETECTED   Cocaine NONE DETECTED  NONE DETECTED   Benzodiazepines NONE DETECTED  NONE DETECTED   Amphetamines NONE DETECTED  NONE DETECTED   Tetrahydrocannabinol NONE DETECTED  NONE DETECTED   Barbiturates NONE DETECTED  NONE DETECTED   Comment:            DRUG SCREEN FOR MEDICAL PURPOSES     ONLY.  IF CONFIRMATION IS NEEDED  FOR ANY PURPOSE, NOTIFY LAB     WITHIN 5 DAYS.                LOWEST DETECTABLE LIMITS     FOR URINE DRUG SCREEN     Drug Class       Cutoff (ng/mL)     Amphetamine      1000     Barbiturate      200     Benzodiazepine   200     Tricyclics       300     Opiates          300     Cocaine          300     THC              50  GLUCOSE, CAPILLARY     Status: Abnormal   Collection Time    09/05/13 10:29 PM      Result Value Range   Glucose-Capillary 159 (*) 70 - 99 mg/dL  TROPONIN I     Status: None   Collection Time    09/06/13 12:05 AM      Result Value Range   Troponin I <0.30  <0.30 ng/mL   Comment:            Due to the release kinetics of cTnI,     a negative result within the first hours     of the onset of symptoms does not rule out     myocardial infarction with certainty.     If myocardial infarction is still suspected,     repeat the test at appropriate intervals.  MAGNESIUM     Status: None   Collection Time    09/06/13 12:05 AM      Result Value Range   Magnesium 1.8  1.5 - 2.5 mg/dL  VITAMIN Z61     Status: None   Collection Time    09/06/13 12:05 AM      Result Value Range   Vitamin  B-12 553  211 - 911 pg/mL   Comment: Performed at [facility]  TSH     Status: None   Collection Time    09/06/13 12:05 AM      Result Value Range   TSH 1.246  0.350 - 4.500 uIU/mL   Comment: Performed at [facility]  BASIC METABOLIC PANEL     Status: Abnormal   Collection Time    09/06/13  5:05 AM      Result Value Range   Sodium 137  135 - 145 mEq/L   Potassium 3.6  3.5 - 5.1 mEq/L   Chloride 100  96 - 112 mEq/L   CO2 29  19 - 32 mEq/L   Glucose, Bld 144 (*) 70 - 99 mg/dL   BUN 14  6 - 23 mg/dL   Creatinine, Ser 0.96  0.50 - 1.10 mg/dL   Calcium 8.5  8.4 - 04.5 mg/dL   GFR calc non Af Amer 86 (*) >90 mL/min   GFR calc Af Amer >90  >90 mL/min   Comment: (NOTE)     The eGFR has been calculated using the CKD EPI equation.     This calculation has not been validated in all clinical situations.     eGFR's persistently <90 mL/min signify possible Chronic Kidney     Disease.  CBC     Status: Abnormal   Collection Time    09/06/13  5:05 AM      Result Value Range   WBC 10.2  4.0 - 10.5 K/uL   RBC 4.04  3.87 - 5.11 MIL/uL   Hemoglobin 10.5 (*) 12.0 - 15.0 g/dL   HCT 14.7 (*) 82.9 - 56.2 %   MCV 83.9  78.0 - 100.0 fL   MCH 26.0  26.0 - 34.0 pg   MCHC 31.0  30.0 - 36.0 g/dL   RDW 13.0  86.5 - 78.4 %   Platelets 283  150 - 400 K/uL  TROPONIN I     Status: None   Collection Time    09/06/13  5:05 AM      Result Value Range   Troponin I <0.30  <0.30 ng/mL   Comment:            Due to the release kinetics of cTnI,     a negative result within the first hours     of the onset of symptoms does not rule out     myocardial infarction with certainty.     If myocardial infarction is still suspected,     repeat the test at appropriate intervals.  GLUCOSE, CAPILLARY     Status: Abnormal   Collection Time    09/06/13  7:44 AM      Result Value Range   Glucose-Capillary 133 (*) 70 - 99 mg/dL  GLUCOSE, CAPILLARY     Status: Abnormal   Collection Time    09/06/13  11:48 AM      Result Value Range   Glucose-Capillary 185 (*) 70 - 99 mg/dL   Comment 1 Notify RN     Labs are reviewed and are pertinent for increased WBC count.  Current Facility-Administered Medications  Medication Dose Route Frequency Provider Last Rate Last Dose  . acetaminophen (TYLENOL) tablet 650 mg  650 mg Oral Q6H PRN Catarina Hartshorn, MD       Or  . acetaminophen (TYLENOL) suppository 650 mg  650 mg Rectal Q6H PRN Catarina Hartshorn, MD      . ipratropium (ATROVENT) nebulizer solution 0.5 mg  0.5 mg Nebulization Q4H PRN Catarina Hartshorn, MD       And  . albuterol (PROVENTIL) (5 MG/ML) 0.5% nebulizer solution 2.5 mg  2.5 mg Nebulization Q4H PRN Catarina Hartshorn, MD      . atorvastatin (LIPITOR) tablet 10 mg  10 mg Oral Daily Catarina Hartshorn, MD   10 mg at 09/06/13 1222  . clonazePAM (KLONOPIN) tablet 0.25 mg  0.25 mg Oral QHS PRN Rhetta Mura, MD      . enoxaparin (LOVENOX) injection 40 mg  40 mg Subcutaneous QHS Catarina Hartshorn, MD   40 mg at 09/06/13 0043  . estrogens (conjugated) (PREMARIN) tablet 0.625 mg  0.625 mg Oral Daily Catarina Hartshorn, MD   0.625 mg at 09/06/13 1222  . haloperidol (HALDOL) tablet 1 mg  1 mg Oral Q6H PRN Leda Gauze, NP   1 mg at 09/06/13 1050  . haloperidol (HALDOL) tablet 2-3 mg  2-3 mg Oral TID Rhetta Mura, MD       Or  . haloperidol lactate (HALDOL) injection 2-3 mg  2-3 mg Intramuscular TID Rhetta Mura, MD      . hydrOXYzine (ATARAX/VISTARIL) tablet 50 mg  50 mg Oral Q6H Jai-Gurmukh Samtani, MD      . insulin aspart (novoLOG) injection 0-15 Units  0-15 Units Subcutaneous TID WC Catarina Hartshorn, MD   3 Units at 09/06/13 1219  . insulin glargine (LANTUS)  injection 13 Units  13 Units Subcutaneous QHS Catarina Hartshorn, MD   13 Units at 09/06/13 0043  . levETIRAcetam (KEPPRA XR) 24 hr tablet 500 mg  500 mg Oral BID Rhetta Mura, MD      . LORazepam (ATIVAN) tablet 0.5 mg  0.5 mg Oral BID PRN Leda Gauze, NP      . magnesium oxide (MAG-OX) tablet 200 mg  200 mg Oral  Q breakfast Catarina Hartshorn, MD   200 mg at 09/06/13 0806  . [START ON 09/07/2013] nicotine (NICODERM CQ - dosed in mg/24 hours) patch 14 mg  14 mg Transdermal Daily Rhetta Mura, MD      . ondansetron (ZOFRAN) tablet 4 mg  4 mg Oral Q6H PRN Catarina Hartshorn, MD       Or  . ondansetron (ZOFRAN) injection 4 mg  4 mg Intravenous Q6H PRN Catarina Hartshorn, MD      . pantoprazole (PROTONIX) EC tablet 80 mg  80 mg Oral Daily Catarina Hartshorn, MD   80 mg at 09/06/13 1221  . pregabalin (LYRICA) capsule 600 mg  600 mg Oral QHS Rhetta Mura, MD      . pyridostigmine (MESTINON) tablet 60 mg  60 mg Oral BID Catarina Hartshorn, MD   60 mg at 09/06/13 1221  . QUEtiapine (SEROQUEL) tablet 200 mg  200 mg Oral QHS Jai-Gurmukh Samtani, MD      . sodium chloride 0.9 % injection 3 mL  3 mL Intravenous Q12H Catarina Hartshorn, MD      . Melene Muller ON 09/07/2013] venlafaxine XR (EFFEXOR-XR) 24 hr capsule 75 mg  75 mg Oral Daily Rhetta Mura, MD       Current Outpatient Prescriptions  Medication Sig Dispense Refill  . acetaminophen (TYLENOL) 325 MG tablet Take 2 tablets (650 mg total) by mouth every 6 (six) hours as needed.      Marland Kitchen albuterol (PROVENTIL) (2.5 MG/3ML) 0.083% nebulizer solution Take 3 mLs (2.5 mg total) by nebulization every 6 (six) hours as needed for wheezing.  75 mL  0  . atorvastatin (LIPITOR) 10 MG tablet Take 10 mg by mouth daily.      Marland Kitchen CALCIUM-VITAMIN D PO Take 1 tablet by mouth daily with breakfast. Calcium 1200- Vitamin D1000 mg.      . clonazePAM (KLONOPIN) 0.25 MG disintegrating tablet Take 1 tablet (0.25 mg total) by mouth at bedtime as needed.  60 tablet  0  . estrogens, conjugated, (PREMARIN) 0.625 MG tablet Take 0.625 mg by mouth daily.      . hydrochlorothiazide (HYDRODIURIL) 12.5 MG tablet Take 1 tablet (12.5 mg total) by mouth daily.  30 tablet  0  . insulin glargine (LANTUS) 100 UNIT/ML injection Inject 26 Units into the skin at bedtime.       Marland Kitchen ipratropium (ATROVENT) 0.02 % nebulizer solution Take 2.5 mLs (500  mcg total) by nebulization 4 (four) times daily.  75 mL  0  . levETIRAcetam (KEPPRA XR) 500 MG 24 hr tablet Take 500 mg by mouth 2 (two) times daily.      . Liraglutide 18 MG/3ML SOPN Inject into the skin. Inject 1.8 mg into the skin daily      . Magnesium 250 MG TABS Take 250 mg by mouth daily with breakfast.      . metFORMIN (GLUCOPHAGE-XR) 500 MG 24 hr tablet Take 1,500 mg by mouth daily at 12 noon.       . Multiple Vitamins-Minerals (CENTRUM SILVER PO) Take 1 tablet by mouth daily.      Marland Kitchen  nicotine (NICODERM CQ - DOSED IN MG/24 HOURS) 14 mg/24hr patch Place 1 patch onto the skin daily.  28 patch  0  . omeprazole (PRILOSEC) 20 MG capsule Take 20 mg by mouth Daily.       . pregabalin (LYRICA) 300 MG capsule Take 2 capsules (600 mg total) by mouth at bedtime.  180 capsule  1  . haloperidol (HALDOL) 1 MG tablet Take 1 tablet (1 mg total) by mouth every 6 (six) hours as needed.      Marland Kitchen LORazepam (ATIVAN) 0.5 MG tablet Take 1 tablet (0.5 mg total) by mouth 2 (two) times daily as needed for anxiety (restlessness).  30 tablet  0  . QUEtiapine (SEROQUEL) 200 MG tablet Take 1 tablet (200 mg total) by mouth at bedtime.      Melene Muller ON 09/07/2013] venlafaxine XR (EFFEXOR-XR) 75 MG 24 hr capsule Take 1 capsule (75 mg total) by mouth daily.        Psychiatric Specialty Exam:     Blood pressure 132/78, pulse 71, temperature 98.4 F (36.9 C), temperature source Oral, resp. rate 18, height 5' (1.524 m), weight 180 lb 12.4 oz (82 kg), SpO2 94.00%.Body mass index is 35.31 kg/(m^2).  General Appearance: Bizarre and Guarded  Eye Contact::  Poor  Speech:  Pressured  Volume:  Decreased  Mood:  Angry and Irritable  Affect:  Labile  Thought Process:  Circumstantial, Disorganized, Loose and Tangential  Orientation:  Full (Time, Place, and Person)  Thought Content:  Rumination  Suicidal Thoughts:  No  Homicidal Thoughts:  No  Memory:  Difficult to cooperate  Judgement:  Poor  Insight:  Lacking and Shallow   Psychomotor Activity:  Increased  Concentration:  Poor  Recall:  Poor  Akathisia:  No  Handed:  Right  AIMS (if indicated):     Assets:  Housing Social Support  Sleep:      Treatment Plan Summary: Medication management Recommended reducing Effexor to 75 mg and start Seroquel 200 mg at bedtime.  Patient has been not taking Effexor for past few days as per her husband.  Patient requires inpatient psychiatric treatment for stabilization.  Continue sitter for patient safety.  Once patient is medically cleared, patient needs inpatient psychiatric services.  Social worker please call [phone] to facilitate the inpatient transfer to behavioral [facility]. Please call [phone] if you have further questions.   ARFEEN,SYED T. 09/06/2013 3:44 PM

## 2013-09-06 NOTE — Progress Notes (Signed)
Echocardiogram 2D Echocardiogram has been performed.  Dorothey Baseman 09/06/2013, 1:50 PM

## 2013-09-06 NOTE — Progress Notes (Signed)
Pt restless, pulled out IV and refused to have IV restarted. Pt states she is calling her husband and going home. I told pt to wait until morning when the MD rounds. She did not respond will continue to monitor and atttempt to keep pt calm.
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
## 2013-09-06 NOTE — Discharge Summary (Signed)
Physician Discharge Summary  Joyce Richardson JXB:[id number] DOB: 1946/05/31 DOA: 09/05/2013  PCP: Astrid Divine, MD  Admit date: 09/05/2013 Discharge date: 09/06/2013  Time spent: 40 minutes  Recommendations for Outpatient Follow-up:  1. Patient will be discharged to the auspices of  behavioral health center  2. I have adjusted her medications as per Dr. Robert Bellow recommendations  3. She will require intensive psychotherapy as well as potentially substance abuse counseling and sensation methodology  Discharge Diagnoses:  Active Problems:   DIABETES MELLITUS, TYPE II   DEPRESSION   Chronic diastolic heart failure   Narcotic overdose   Acute encephalopathy   Discharge Condition: Fair  Diet recommendation: Diabetic and heart healthy  Filed Weights   09/05/13 2241  Weight: 82 kg (180 lb 12.4 oz)    History of present illness:  This 67 year old Caucasian female just recently discharged 09/01/2013 with known history of diastolic CHF,polysubstance abuse with overdose in past, etoh abuse, sz disorder unclear if in setting of drug abuse, dm, htn brought in by EMS because she became unresponsive Her husband had recently been hospitalized at [facility] for pneumonia and over the 40s and he was hospitalized, patient started becoming more disorganized and more tangential and confused. EMS was called and found 75 mcg fentanyl patch at the back of his mouth when they tried to intubate her. Further fentanyl patches and oxycodone were found in her possession which she should not be at discretion of-her urinary drug screen was negative, TSH was negative, level <11 Patient has history of substance abuse and is followed with [facility] and in fact used to volunteer with them She was increasingly tangential, emotionally labile, and aggressive and it was noted by the psychiatrist who saw her last hospital stay  That they recommended inpatient psychiatry stay-as such she was  deemed medically from internal medicine standpoint for discharge to behavioral health. She continued all her prior to admission medications Her husband would like her to use her home medications and will be able to furnish them for her use .    Discharge Exam: Filed Vitals:   09/06/13 1405  BP: 132/78  Pulse: 71  Temp: 98.4 F (36.9 C)  Resp: 18   Tangential, significant for diabetes, trying to elope, aggressive but then calms down and her urine and some extent and then becomes tangential once again  General: EOMI NCAT Cardiovascular: S1-S2 no murmur rub or gallop Respiratory: Clinically clear  Discharge Instructions  Discharge Orders   Future Orders Complete By Expires   Diet - low sodium heart healthy  As directed    Increase activity slowly  As directed        Medication List    STOP taking these medications       pyridostigmine 60 MG tablet  Commonly known as:  MESTINON      TAKE these medications       acetaminophen 325 MG tablet  Commonly known as:  TYLENOL  Take 2 tablets (650 mg total) by mouth every 6 (six) hours as needed.     albuterol (2.5 MG/3ML) 0.083% nebulizer solution  Commonly known as:  PROVENTIL  Take 3 mLs (2.5 mg total) by nebulization every 6 (six) hours as needed for wheezing.     atorvastatin 10 MG tablet  Commonly known as:  LIPITOR  Take 10 mg by mouth daily.     CALCIUM-VITAMIN D PO  Take 1 tablet by mouth daily with breakfast. Calcium 1200- Vitamin D1000 mg.  CENTRUM SILVER PO  Take 1 tablet by mouth daily.     clonazePAM 0.25 MG disintegrating tablet  Commonly known as:  KLONOPIN  Take 1 tablet (0.25 mg total) by mouth at bedtime as needed.     estrogens (conjugated) 0.625 MG tablet  Commonly known as:  PREMARIN  Take 0.625 mg by mouth daily.     haloperidol 1 MG tablet  Commonly known as:  HALDOL  Take 1 tablet (1 mg total) by mouth every 6 (six) hours as needed.     hydrochlorothiazide 12.5 MG tablet  Commonly  known as:  HYDRODIURIL  Take 1 tablet (12.5 mg total) by mouth daily.     insulin glargine 100 UNIT/ML injection  Commonly known as:  LANTUS  Inject 26 Units into the skin at bedtime.     ipratropium 0.02 % nebulizer solution  Commonly known as:  ATROVENT  Take 2.5 mLs (500 mcg total) by nebulization 4 (four) times daily.     levETIRAcetam 500 MG 24 hr tablet  Commonly known as:  KEPPRA XR  Take 500 mg by mouth 2 (two) times daily.     Liraglutide 18 MG/3ML Sopn  Inject into the skin. Inject 1.8 mg into the skin daily     LORazepam 0.5 MG tablet  Commonly known as:  ATIVAN  Take 1 tablet (0.5 mg total) by mouth 2 (two) times daily as needed for anxiety (restlessness).     Magnesium 250 MG Tabs  Take 250 mg by mouth daily with breakfast.     metFORMIN 500 MG 24 hr tablet  Commonly known as:  GLUCOPHAGE-XR  Take 1,500 mg by mouth daily at 12 noon.     nicotine 14 mg/24hr patch  Commonly known as:  NICODERM CQ - dosed in mg/24 hours  Place 1 patch onto the skin daily.     omeprazole 20 MG capsule  Commonly known as:  PRILOSEC  Take 20 mg by mouth Daily.     pregabalin 300 MG capsule  Commonly known as:  LYRICA  Take 2 capsules (600 mg total) by mouth at bedtime.     QUEtiapine 200 MG tablet  Commonly known as:  SEROQUEL  Take 1 tablet (200 mg total) by mouth at bedtime.     venlafaxine XR 75 MG 24 hr capsule  Commonly known as:  EFFEXOR-XR  Take 1 capsule (75 mg total) by mouth daily.  Start taking on:  09/07/2013       Allergies  Allergen Reactions  . Penicillins Swelling    OF LIPS  . Tegretol [Carbamazepine] Other (See Comments)    HYPONATREMIA      The results of significant diagnostics from this hospitalization (including imaging, microbiology, ancillary and laboratory) are listed below for reference.    Significant Diagnostic Studies: Dg Chest 2 View  08/30/2013   *RADIOLOGY REPORT*  Clinical Data: Altered mental status; admission chest radiograph.   CHEST - 2 VIEW  Comparison: Chest radiograph from 05/18/2013  Findings: The lungs are well-aerated.  Vascular congestion is noted, with mildly increased interstitial markings, suggestive of mild interstitial edema.  No pleural effusion or pneumothorax is seen.  The heart is mildly enlarged.  No acute osseous abnormalities are seen.  Clips are noted within the right upper quadrant, reflecting prior cholecystectomy.  IMPRESSION: Vascular congestion and mild cardiomegaly, with mildly increased interstitial markings, suggestive of mild interstitial edema.   Original Report Authenticated By: Tonia Ghent, M.D.   Dg Tibia/fibula Left  08/31/2013   *  RADIOLOGY REPORT*  Clinical Data: Post fall, history of avulsion fracture involving the ankle, now with pain approximately 3 cm below the knee, subsequent encounter.  LEFT TIBIA AND FIBULA - 2 VIEW  Comparison: Left ankle radiographs - earlier same day; 08/30/2013  Findings:  The known minimal displaced avulsion fracture involving the distal end of the fibula is suboptimally evaluated on the present examination to [facility]  There is an ill-defined serpiginous lucency involving the medial aspect of the proximal fibula.  Limited visualization of the adjacent knee and ankle is normal.  Regional soft tissues are normal.  No radiopaque foreign body.  IMPRESSION: 1.  Serpiginous lucency involving the medial aspect of the proximal fibula may represent a nondisplaced fracture.  Correlation for point tenderness at this location is recommended. 2.  The known a minimally displaced avulsion fracture involving the distal end of the fibula is suboptimally evaluated on the present examination.   Original Report Authenticated By: Tacey Ruiz, MD   Dg Ankle Complete Left  08/30/2013   CLINICAL DATA:  Fall, twisted ankle with lateral pain and swelling  EXAM: LEFT ANKLE COMPLETE - 3+ VIEW  COMPARISON:  None.  FINDINGS: A small avulsion fracture at the distal tip of the lateral malleolus  with associated overlying soft tissue swelling. Plantar calcaneal spurring. No ankle joint effusion. The remainder of the visualized bones and joints are intact and unremarkable.  IMPRESSION: Small avulsion fracture at the distal tip of the lateral malleolus.   Electronically Signed   By: Malachy Moan M.D.   On: 08/30/2013 01:58   Ct Head Wo Contrast  09/05/2013   CLINICAL DATA:  Altered mental status  EXAM: CT HEAD WITHOUT CONTRAST  TECHNIQUE: Contiguous axial images were obtained from the base of the skull through the vertex without intravenous contrast. Study was obtained within 24 hr of patient's arrival at the emergency department.  COMPARISON:  brain CT and brain MRI August 30, 2013  FINDINGS: There is moderate diffuse atrophy, stable. There is no mass, hemorrhage, extra-axial fluid collection, or midline shift. There is mild small vessel disease in the centra semiovale bilaterally, stable. There is no new gray-white compartment lesion. No acute infarct apparent. The bony calvarium appears intact. The mastoid air cells are clear.  IMPRESSION: Atrophy with small vessel disease. No apparent mass, hemorrhage, or acute appearing infarct.   Electronically Signed   By: Bretta Bang M.D.   On: 09/05/2013 19:39   Ct Head Wo Contrast  08/30/2013   CLINICAL DATA:  Altered mental status.  EXAM: CT HEAD WITHOUT CONTRAST  TECHNIQUE: Contiguous axial images were obtained from the base of the skull through the vertex without intravenous contrast.  COMPARISON:  05/18/2013.  FINDINGS: Interval ill-defined areas of low density involving the gray and white matter in the left frontoparietal region and right parietal occipital region. No intracranial hemorrhage or mass lesion. Mild patchy white matter low density in both frontal lobes. Diffusely enlarged ventricles and subarachnoid spaces with little change. Unremarkable bones and included paranasal sinuses.  IMPRESSION: 1. Findings suspicious for acute bilateral  cerebral infarctions, as described above. Further evaluation with MRI and possibly MRA is recommended. 2. Atrophy and mild chronic small vessel white matter ischemic changes.  These results were called by telephone at the time of interpretation on 08/30/2013 at 3:56 PM to [facility] , who verbally acknowledged these results.   Electronically Signed   By: Gordan Payment M.D.   On: 08/30/2013 15:55   Mr Angiogram Head Wo  Contrast  08/30/2013   CLINICAL DATA:  Altered mental status, recent ankle trauma. Lethargic. Evaluate for cerebral infarction.  EXAM: MRI HEAD WITHOUT CONTRAST  MRA HEAD WITHOUT CONTRAST  TECHNIQUE: Multiplanar, multiecho pulse sequences of the brain and surrounding structures were obtained without intravenous contrast. Angiographic images of the head were obtained using MRA technique without contrast.  COMPARISON:  CT head earlier today.  FINDINGS: MRI HEAD FINDINGS  No evidence for acute infarction, hemorrhage, mass lesion, hydrocephalus, or extra-axial fluid. Mild chronic microvascular ischemic change. No chronic hemorrhage. Bilateral cataract extraction. Negative osseous structures. No sinus or mastoid disease. Compared with CT earlier today, the appearance is secondary to atrophy, asymmetry of the patient in the gantry, and motion.  MRA HEAD FINDINGS  The internal carotid arteries are widely patent. The basilar artery is widely patent. Both vertebrals contribute to its formation. There is no intracranial stenosis or aneurysm.  IMPRESSION: MRI HEAD IMPRESSION  No acute intracranial abnormality. Atrophy and small vessel disease.  MRA HEAD IMPRESSION  Unremarkable MRA intracranial.   Electronically Signed   By: Davonna Belling M.D.   On: 08/30/2013 17:42   Mr Brain Wo Contrast  08/30/2013   CLINICAL DATA:  Altered mental status, recent ankle trauma. Lethargic. Evaluate for cerebral infarction.  EXAM: MRI HEAD WITHOUT CONTRAST  MRA HEAD WITHOUT CONTRAST  TECHNIQUE: Multiplanar, multiecho  pulse sequences of the brain and surrounding structures were obtained without intravenous contrast. Angiographic images of the head were obtained using MRA technique without contrast.  COMPARISON:  CT head earlier today.  FINDINGS: MRI HEAD FINDINGS  No evidence for acute infarction, hemorrhage, mass lesion, hydrocephalus, or extra-axial fluid. Mild chronic microvascular ischemic change. No chronic hemorrhage. Bilateral cataract extraction. Negative osseous structures. No sinus or mastoid disease. Compared with CT earlier today, the appearance is secondary to atrophy, asymmetry of the patient in the gantry, and motion.  MRA HEAD FINDINGS  The internal carotid arteries are widely patent. The basilar artery is widely patent. Both vertebrals contribute to its formation. There is no intracranial stenosis or aneurysm.  IMPRESSION: MRI HEAD IMPRESSION  No acute intracranial abnormality. Atrophy and small vessel disease.  MRA HEAD IMPRESSION  Unremarkable MRA intracranial.   Electronically Signed   By: Davonna Belling M.D.   On: 08/30/2013 17:42   Dg Chest Port 1 View  09/06/2013   CLINICAL DATA:  Altered mental status  EXAM: PORTABLE CHEST - 1 VIEW  COMPARISON:  08/30/2013  FINDINGS: Focal patchy density at the right base is compatible with atelectasis versus airspace disease. Minimal subsegmental atelectasis at the left base. Upper lungs clear. Normal vascularity. Mild cardiomegaly. No pneumothorax or pleural effusion.  IMPRESSION: Bibasilar mild patchy densities right greater than left likely subsegmental atelectasis as described above.  Cardiomegaly without evidence of decompensation.   Electronically Signed   By: Maryclare Bean M.D.   On: 09/06/2013 07:53   Dg Foot Complete Left  08/31/2013   *RADIOLOGY REPORT*  Clinical Data: Pain involving the second MTP joint  LEFT FOOT - COMPLETE 3+ VIEW  Comparison: The left ankle radiographs - 08/30/2013  Findings:  The known a minimally displaced avulsion fracture involving the  distal end of the fibula is suboptimally evaluate on the present examination.  No additional fracture or dislocation.  Regional soft tissues are normal.  A small plantar calcaneal spur.  Minimal enthesopathic change of the Achilles tendon insertion site.  IMPRESSION: 1.  No new fracture.  Known minimally displaced fracture involving the distal end of the  fibula is suboptimally evaluated on the present examination.  2.  Small plantar calcaneal spur.   Original Report Authenticated By: Tacey Ruiz, MD    Microbiology: Recent Results (from the past 240 hour(s))  URINE CULTURE     Status: None   Collection Time    08/30/13  5:32 PM      Result Value Range Status   Specimen Description URINE, RANDOM   Final   Special Requests NONE   Final   Culture  Setup Time     Final   Value: 08/30/2013 22:32     Performed at [facility]   Colony Count     Final   Value: NO GROWTH     Performed at [facility]   Culture     Final   Value: NO GROWTH     Performed at [facility]   Report Status 08/31/2013 FINAL   Final     Labs: Basic Metabolic Panel:  Recent Labs Lab 08/30/13 1533 08/31/13 0403 09/01/13 0610 09/05/13 1850 09/06/13 0005 09/06/13 0505  NA 131* 135 138 131*  --  137  K 4.9 4.2 3.4* 3.2*  --  3.6  CL 98 99 102 94*  --  100  CO2 22 28 27 24   --  29  GLUCOSE 259* 166* 113* 199*  --  144*  BUN 18 11 12 18   --  14  CREATININE 0.77 0.60 0.64 0.77  --  0.72  CALCIUM 8.7 8.5 8.4 9.0  --  8.5  MG  --   --   --   --  1.8  --    Liver Function Tests:  Recent Labs Lab 08/30/13 1623 08/31/13 0403 09/01/13 0610  AST 358* 266* 144*  ALT 150* 128* 101*  ALKPHOS 105 92 83  BILITOT 0.4 0.5 0.4  PROT 7.0 6.4 6.4  ALBUMIN 3.3* 2.9* 2.8*   No results found for this basename: LIPASE, AMYLASE,  in the last 168 hours No results found for this basename: AMMONIA,  in the last 168 hours CBC:  Recent Labs Lab 08/30/13 1533 08/31/13 0403 09/01/13 0610  09/05/13 1850 09/06/13 0505  WBC 16.2* 14.1* 12.2* 18.6* 10.2  NEUTROABS 11.3*  --   --  15.1*  --   HGB 12.1 11.0* 11.7* 11.6* 10.5*  HCT 37.7 34.5* 36.6 36.3 33.9*  MCV 83.4 82.9 82.4 82.9 83.9  PLT 314 302 286 282 283   Cardiac Enzymes:  Recent Labs Lab 08/30/13 1623 09/06/13 0005 09/06/13 0505  TROPONINI <0.30 <0.30 <0.30   BNP: BNP (last 3 results)  Recent Labs  05/18/13 1240 09/01/13 0610  PROBNP 583.7* 378.5*   CBG:  Recent Labs Lab 09/01/13 0720 09/01/13 1128 09/05/13 2229 09/06/13 0744 09/06/13 1148  GLUCAP 126* 243* 159* 133* 185*       Signed:  Rhetta Mura  [facility] 09/06/2013, 2:19 PM

## 2013-09-06 NOTE — Care Management Note (Signed)
    Page 1 of 1   09/06/2013     11:22:10 AM   CARE MANAGEMENT NOTE 09/06/2013  Patient:  Joyce Richardson, Joyce Richardson   Account Number:  [id number]  Date Initiated:  09/06/2013  Documentation initiated by:  Lanier Clam  Subjective/Objective Assessment:   67 Y/O F ADMITTED W/NARCOTIC OVERDOSE.ZO:XWRU/EAVWUJWJX ABUSE,DM,SEIZURE.     Action/Plan:   FROM HOME W/SPOUSE.   Anticipated DC Date:  09/07/2013   Anticipated [facility] Plan[facility]      DC Planning Services  CM consult      Choice offered to / List presented to:             Status of service:  In process, will continue to follow Medicare Important Message given?   (If response is "NO", the following Medicare IM given date fields will be blank) Date Medicare IM given:   Date Additional Medicare IM given:    Discharge Disposition:    Per UR Regulation:  Reviewed for med. necessity/level of care/duration of stay  If discussed at Long Length of Stay Meetings, dates discussed:    Comments:  09/06/13 Kamariah Fruchter RN,BSN NCM [phone] PSYCH SW- FOLLOWING.PSYCH CONS-AWAIT RECOMMENDATIONS.

## 2013-09-06 NOTE — Progress Notes (Signed)
Attempted to replace Telemetry monitor. Pt upset, refused VS this am. Continues to be agitated and refused to take meds for me.  Restraints in place...circulation WNL. Will continue to monitor. SRP, RN

## 2013-09-06 NOTE — Progress Notes (Signed)
Pt aaox3.  Pt denies SI/HI/AH/VH at this time.  Pt sitter at bedside.  Pt ambulatoryy.  Pt husband at bed side.  Pt calm and cooperative at this time

## 2013-09-06 NOTE — Tx Team (Signed)
Initial Interdisciplinary Treatment Plan  PATIENT STRENGTHS: (choose at least two) Motivation for treatment/growth Supportive family/friends  PATIENT STRESSORS: Financial difficulties Health problems Medication change or noncompliance Substance abuse   PROBLEM LIST: Problem List/Patient Goals Date to be addressed Date deferred Reason deferred Estimated date of resolution  Substance abuse 09/06/2013   D/c        Suicidal ideation 09/06/2013   D/c        depression 09/06/2013   D/c                           DISCHARGE CRITERIA:  Ability to meet basic life and health needs Adequate post-discharge living arrangements Improved stabilization in mood, thinking, and/or behavior Medical problems require only outpatient monitoring Motivation to continue treatment in a less acute level of care Need for constant or close observation no longer present Reduction of life-threatening or endangering symptoms to within safe limits Safe-care adequate arrangements made Verbal commitment to aftercare and medication compliance Withdrawal symptoms are absent or subacute and managed without 24-hour nursing intervention  PRELIMINARY DISCHARGE PLAN: Attend aftercare/continuing care group Attend PHP/IOP Attend 12-step recovery group Outpatient therapy Return to previous living arrangement  PATIENT/FAMIILY INVOLVEMENT: This treatment plan has been presented to and reviewed with the patient, Joyce Richardson.  The patient and family have been given the opportunity to ask questions and make suggestions.  [name] 09/06/2013, 6:45 PM

## 2013-09-07 DIAGNOSIS — F332 Major depressive disorder, recurrent severe without psychotic features: Principal | ICD-10-CM | POA: Diagnosis present

## 2013-09-07 LAB — GLUCOSE, CAPILLARY
Glucose-Capillary: 87 mg/dL (ref 70–99)
Glucose-Capillary: 220 mg/dL — ABNORMAL HIGH (ref 70–99)
Glucose-Capillary: 183 mg/dL — ABNORMAL HIGH (ref 70–99)

## 2013-09-07 LAB — COMPREHENSIVE METABOLIC PANEL
ALT: 32 U/L (ref 0–35)
AST: 28 U/L (ref 0–37)
Albumin: 2.9 g/dL — ABNORMAL LOW (ref 3.5–5.2)
Alkaline Phosphatase: 90 U/L (ref 39–117)
CO2: 28 mEq/L (ref 19–32)
Calcium: 8.8 mg/dL (ref 8.4–10.5)
GFR calc non Af Amer: 89 mL/min — ABNORMAL LOW (ref >90)
Glucose, Bld: 87 mg/dL (ref 70–99)
Potassium: 3.3 mEq/L — ABNORMAL LOW (ref 3.5–5.1)
Sodium: 139 mEq/L (ref 135–145)
Total Protein: 6.4 g/dL (ref 6.0–8.3)

## 2013-09-07 MED ORDER — POTASSIUM CHLORIDE CRYS ER 20 MEQ PO TBCR
20.0000 meq | EXTENDED_RELEASE_TABLET | Freq: Two times a day (BID) | ORAL | Status: AC
  Administered 2013-09-07 – 2013-09-10 (×6): 20 meq via ORAL
  Filled 2013-09-07 (×6): qty 1

## 2013-09-07 NOTE — Progress Notes (Signed)
[facility] Group Notes:  (Nursing/MHT/Case Management/Adjunct)  Date:  09/07/2013  Time:  2000  Type of Therapy:  Psychoeducational Skills  Participation Level:  Active  Participation Quality:  Appropriate  Affect:  Appropriate  Cognitive:  Appropriate  Insight:  Good  Engagement in Group:  Engaged  Modes of Intervention:  Education  Summary of Progress/Problems: The patient described her day as having been "stressful". She verbalized in group that she talked quite a bit in group about her issues as well as her husband'Richardson illness. Her goal for tomorrow is to not talk as much about her reason for admission. As a theme for the day, her relapse prevention technique is to try not to dwell on her stress.   Joyce Richardson 09/07/2013, 11:22 PM

## 2013-09-07 NOTE — [facility] Group Notes (Signed)
[facility] LCSW Group Therapy  09/07/2013  1:05 PM  Type of Therapy:  Group therapy  Participation Level:  Did not attend    Summary of Progress/Problems:  Chaplain was here to lead a group on themes of hope and courage.  Daryel Gerald B 09/07/2013 1:24 PM

## 2013-09-07 NOTE — Progress Notes (Signed)
Recreation Therapy Notes  Date: 10.17.2014 Time: 9:30am Location: [address] Dayroom  Group Topic: Leisure Education  Goal Area(s) Addresses:  Patient will identify appropriate leisure activity to correspond with letters of the alphabet.  Patient will identify benefit of leisure.   Behavioral Response: Engaged, Appropriate  Intervention: Game  Activity: Leisure ABC's. As a group patients were asked to identify leisure activities to correspond with each letter of the alphabet.   Education:  Leisure Education, Building control surveyor  Education Outcome: Acknowledges education  Clinical Observations/Feedback: Patient participated in group activity, stating appropriate leisure activities to correspond with letters of the alphabet. Patient made no additional statements or contributions to group discussion, but appeared to actively listen as she maintained appropriate eye contact with speaker.   Marykay Lex Peregrine Nolt, LRT/CTRS  Jearl Klinefelter 09/07/2013 12:37 PM
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
## 2013-09-07 NOTE — Progress Notes (Signed)
Patient ID: Joyce Richardson, female   DOB: 13-Oct-1946, 67 y.o.   MRN: [id number] D. Patient presents with depressed mood, affect appropriate. Patient has been isolative to room throughout most of shift. Patient declined to complete self inventory. Patient states '' I'm feeling a little better . I'm going to be ok, we've got a different plan for the lock box'' Patient visible in the milieu and on the unit. Medications given as ordered. Support and encouragement provided. R. Patient remains cooperative, will continue to monitor q 15 minutes for safety.

## 2013-09-07 NOTE — Progress Notes (Signed)
Nutrition Brief Note  Patient identified on the Malnutrition Screening Tool (MST) Report.  Wt Readings from Last 10 Encounters:  09/06/13 178 lb (80.74 kg)  09/05/13 180 lb 12.4 oz (82 kg)  09/01/13 180 lb 3.2 oz (81.738 kg)  08/15/13 188 lb (85.276 kg)  06/28/13 177 lb (80.287 kg)  05/19/13 158 lb 11.7 oz (72 kg)  03/15/13 180 lb (81.647 kg)  02/22/13 179 lb (81.194 kg)  02/21/13 180 lb (81.647 kg)  02/21/13 180 lb (81.647 kg)   Body mass index is 34.76 kg/(m^2). Patient meets criteria for class I obesity based on current BMI.   Discussed intake PTA with patient and compared to intake presently.  Discussed changes in intake and encouraged adequate intake of meals and snacks. Current diet order is low sodium and pt is also offered choice of unit snacks mid-morning and mid-afternoon.  Pt is eating as desired.   Labs and medications reviewed. Pt with low potassium, recommend replacement.   Met with pt who reports minimal intake for the past week r/t lack of appetite. Noted pt's weight down 2 pounds in the past month. Pt reports eating well since admission and not skipping any meals.   Nutrition Dx:  Unintended wt change r/t suboptimal oral intake AEB pt report  Interventions:   Discussed the importance of nutrition and encouraged intake of food and beverages.    No additional nutrition interventions warranted at this time. If nutrition issues arise, please consult RD.   Levon Hedger MS, RD, LDN [phone] Pager [phone] After Hours Pager

## 2013-09-07 NOTE — [facility] Suicide Risk Assessment (Signed)
Suicide Risk Assessment  Admission Assessment     Nursing information obtained from:  Patient;Family Demographic factors:  Age 69 or older;Caucasian;Low socioeconomic status;Unemployed Current Mental Status:    Loss Factors:  Decline in physical health;Financial problems / change in socioeconomic status Historical Factors:  Family history of mental illness or substance abuse Risk Reduction Factors:  Sense of responsibility to family;Living with another person, especially a relative;Positive social support  CLINICAL FACTORS:   Severe Anxiety and/or Agitation Depression:   Aggression Anhedonia Hopelessness Impulsivity Insomnia  COGNITIVE FEATURES THAT CONTRIBUTE TO RISK:  Closed-mindedness Polarized thinking    SUICIDE RISK:   Minimal: No identifiable suicidal ideation.  Patients presenting with no risk factors but with morbid ruminations; may be classified as minimal risk based on the severity of the depressive symptoms  PLAN OF CARE:1. Admit for crisis management and stabilization. 2. Medication management to reduce current symptoms to base line and improve the     patient's overall level of functioning 3. Treat health problems as indicated. 4. Develop treatment plan to decrease risk of relapse upon discharge and the need for     readmission. 5. Psycho-social education regarding relapse prevention and self care. 6. Health care follow up as needed for medical problems. 7. Restart home medications where appropriate.   I certify that inpatient services furnished can reasonably be expected to improve the patient's condition.  Vasilis Luhman,MD 09/07/2013, 11:22 AM

## 2013-09-07 NOTE — [facility] Group Notes (Signed)
[facility] LCSW Aftercare Discharge Planning Group Note   09/07/2013 12:53 PM  Participation Quality:  Engaged  Mood/Affect:  Appropriate  Depression Rating:  denies  Anxiety Rating:  denies  Thoughts of Suicide:  No Will you contract for safety?   NA  Current AVH:  No  Plan for Discharge/Comments:  "It was my idea to come to the hospital.  I had no place to stay.  I had no place to get medications, and I needed support.  My husband has MS.  We are planning on moving to [facility] with my daughter soon.  I feel confused."  When asked again, states she is homeless.  Mentions nothing about suicide attempt or substance abuse.  Mood nautral.  Story does not hold up with what is in the chart.  However, believes this is where she needs to be and is happy to be here.  Willing to sign consent to talk to husband, daughter.  Transportation Means: family  Supports: family  [place], Baldo Daub

## 2013-09-07 NOTE — Progress Notes (Signed)
Patient ID: Joyce Richardson, female   DOB: 01-Feb-1946, 67 y.o.   MRN: [id number] D. The patient was superficially bright and social. Interacting appropriately in the milieu. Denied any suicidal/homicidal ideation,  a/v hallucinations or pain. Stated that she gets overwhelmed at times and makes poor decisions. A. Met with patient 1:1 to assess. Verbal support given. Encouraged the patient to attend evening group. Reviewed and discussed prescribed medications with the patient.  R. The patient attended evening group and actively participated. Stated that her neurologist Dr. Sandria Manly prescribed Lyrica for her because she has a sleep disorder that causes her to fall out of bed. Compliant with treatment and medications.
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
## 2013-09-07 NOTE — Tx Team (Signed)
  Interdisciplinary Treatment Plan Update   Date Reviewed:  09/07/2013  Time Reviewed:  7:45 AM  Progress in Treatment:   Attending groups: Yes Participating in groups: Yes Taking medication as prescribed: Yes  Tolerating medication: Yes Family/Significant other contact made: Yes  Patient understands diagnosis: Yes As evidenced by asking for help with feeling overwhelmed Discussing patient identified problems/goals with staff: Yes  See initial care plan Medical problems stabilized or resolved: Yes Denies suicidal/homicidal ideation: Yes  In tx team Patient has not harmed self or others: Yes  For review of initial/current patient goals, please see plan of care.  Estimated Length of Stay:  4-5 days  Reason for Continuation of Hospitalization: Anxiety Depression Medication stabilization  New Problems/Goals identified:  N/A  Discharge Plan or Barriers:   return home, follow up outpt, possibly IOP?  Additional Comments:  Patient's husband called EMS when he found his wife unresponsive. Upon arrival, EMS found the patient unresponsive and she had a Fentanyl patch 75 mcg in her mouth. EMS gave Narcan 2 mg nasally and patient woke up. Patient denied putting the patch in her mouth. Patient also c/o abdominal pain and feeling sleepy. Patient's husband states he normally locks the patches up and patient has eaten Fentanyl patches before.   Attendees:  Signature: Thedore Mins, MD 09/07/2013 7:45 AM   Signature: Richelle Ito, LCSW 09/07/2013 7:45 AM  Signature: Fransisca Kaufmann, NP 09/07/2013 7:45 AM  Signature: Joslyn Devon, RN 09/07/2013 7:45 AM  Signature: Liborio Nixon, RN 09/07/2013 7:45 AM  Signature:  09/07/2013 7:45 AM  Signature:   09/07/2013 7:45 AM  Signature:    Signature:    Signature:    Signature:    Signature:    Signature:      Scribe for Treatment Team:   Richelle Ito, LCSW  09/07/2013 7:45 AM

## 2013-09-08 DIAGNOSIS — F411 Generalized anxiety disorder: Secondary | ICD-10-CM

## 2013-09-08 DIAGNOSIS — F1910 Other psychoactive substance abuse, uncomplicated: Secondary | ICD-10-CM

## 2013-09-08 DIAGNOSIS — F1994 Other psychoactive substance use, unspecified with psychoactive substance-induced mood disorder: Secondary | ICD-10-CM

## 2013-09-08 LAB — GLUCOSE, CAPILLARY
Glucose-Capillary: 162 mg/dL — ABNORMAL HIGH (ref 70–99)
Glucose-Capillary: 153 mg/dL — ABNORMAL HIGH (ref 70–99)
Glucose-Capillary: 152 mg/dL — ABNORMAL HIGH (ref 70–99)
Glucose-Capillary: 204 mg/dL — ABNORMAL HIGH (ref 70–99)
Glucose-Capillary: 171 mg/dL — ABNORMAL HIGH (ref 70–99)
Glucose-Capillary: 214 mg/dL — ABNORMAL HIGH (ref 70–99)

## 2013-09-08 MED ORDER — IBUPROFEN 800 MG PO TABS
800.0000 mg | ORAL_TABLET | Freq: Four times a day (QID) | ORAL | Status: DC | PRN
  Administered 2013-09-08 – 2013-09-12 (×8): 800 mg via ORAL
  Filled 2013-09-08 (×8): qty 1

## 2013-09-08 NOTE — Progress Notes (Signed)
Patient ID: Joyce Richardson, female   DOB: May 12, 1946, 67 y.o.   MRN: [id number] D. Patient presents with depressed mood, affect congruent. Patient has been visible on the unit attending unit programming. 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 I know what coping skills are and what to do. Mainly I'm just sore today from falling out of bed last night, I do that, sometimes it happens. '' A. Medications given as ordered as well as prn medication, and hot pack for pain. Patient reports '' OH yes that has really helped '' Patient re-educated on fall risk and discussed above concerns with MD. Orders received to d/c 1/1 during daytime as patient gait steady at this time. R. Patient has been ambulating well in hall, no acute distress noted. No further voiced concerns at this time. Will continue to monitor q 15 minutes for safety.

## 2013-09-08 NOTE — [facility] Group Notes (Signed)
[facility] Group Notes:  (Nursing/MHT/Case Management/Adjunct)  Date:  09/08/2013  Time:  12:14 PM  Type of Therapy:  Psychoeducational Skills  Participation Level:  Active  Participation Quality:  Appropriate  Affect:  Depressed  Cognitive:  Alert  Insight:  Appropriate  Engagement in Group:  Engaged and Improving  Modes of Intervention:  Discussion, Education and Exploration  Summary of Progress/Problems: SELF inventory review with RN and healthy coping skill review. Patient was able to identify several coping skills for her depression stating '' I could swim, I loved to do that, and go to [employer], I can walk and read . [place] Ouch, Shontez Sermon 09/08/2013, 12:14 PM

## 2013-09-08 NOTE — Progress Notes (Signed)
Patient ID: [name], female   DOB: 1946-03-08, 67 y.o.   MRN: [id number] 1:1 Nursing Note: The patient was found sitting on the floor next to her bed. She reported that while she was sleeping she rolled out of bed. The tech on the hall had not observed Rielly sleeping. The side rail of her bed was up in the raised position. It's difficult to understand how the patient fell the way she said she did by where she was located next to the bed and the side rail up. The patient was calm, alert and oriented and able to speak clearly to staff describing the alleged fall. She was assisted back to her bed by staff. BP 111/67 P 67 R16 T 98.4. CBG=178. Denies hitting her head. No report of headache. Neuro signs were good. PERLA. Full ROM. No bleeding or bruising noted. Reports 7/10 pain under left breast, over rib cage area. No redness or bruising noted at this time. Maryjean Morn, PA was notified at 0020 and the patient was placed on a 1:1 for safety. Fall risk plan reviewed with patient. The patient requested all her side rails be raised. Three are up at this time for safety with sitter at bedside.The patient did not want her husband notified due to the late hour. Will continue to monitor for safety.

## 2013-09-08 NOTE — Progress Notes (Signed)
Patient ID: Joyce Richardson, female   DOB: [birth year], 67 y.o.   MRN: [id number] 1:1 Nursing Note: The patient slept very little through the night. She is still complaining of left sided rib cage pain. The PA on call was notified. She was medicated with Tylenol for pain that she scored 8/10. She is able to engage in conversation and ambulate. 1:1 maintained for fall risk. Will continue to monitor for safety.

## 2013-09-08 NOTE — Progress Notes (Signed)
[facility] Group Notes:  (Nursing/MHT/Case Management/Adjunct)  Date:  09/08/2013  Time:  2000  Type of Therapy:  Psychoeducational Skills  Participation Level:  Active  Participation Quality:  Appropriate  Affect:  Appropriate  Cognitive:  Appropriate  Insight:  Good  Engagement in Group:  Improving  Modes of Intervention:  Education  Summary of Progress/Problems: The patient verbalized in group that she had a "pretty good" day for a number of reasons. First of all, she expressed that she learned a great deal. She stated that she normally uses oxygen at night when she is at home. Secondly, she verbalized that going outside for fresh air proved to be very beneficial to her. Finally, she mentioned that she had a good talk with the student nurse. The patient also expressed her frustration in that her daughter is in town and will not be visiting her. Her goal for tomorrow is to keep doing the same things that she did today. As a theme for today, she shared that her coping skill is to make jewelry and play video games.    Joyce Richardson 09/08/2013, 9:20 PM

## 2013-09-08 NOTE — Progress Notes (Signed)
Patient ID: Joyce Richardson, female   DOB: [birth year], 67 y.o.   MRN: [id number] D. Information shared from LCSW of patient's family's concerns regarding hx of respiratory issue/  oxygen use at home during the night, and of requirements for bed position (HOB to be 6 inches higher than bottom of bed ) . Discussed with patient and confirmed that patient does keep HOB elevated at hs. As well as '' sleep condition  Where she has nightmares and tosses herself out of bed from traumatic dreams. ''  A. Writer discussed family and patients concerns with [place] PAC and no orders received, as patient to remain 1/1 at hs. R. Patient in no acute distress. Currently calm and cooperative at this time. Will continue to monitor q 15 minutes for safety.

## 2013-09-08 NOTE — [facility] Group Notes (Signed)
[facility] Group Notes:  (Clinical Social Work)  09/08/2013  11:15-11:45AM  Summary of Progress/Problems:   The main focus of today's process group was for the patient to identify ways in which they have in the past sabotaged their own recovery and reasons they may have done this/what they received from doing it.  We then worked to identify a specific plan to avoid doing this when discharged from the hospital for this admission.  The patient expressed that she has stopped taking her meds before, because she is afraid of taking too much medication, that it is not good for you.  She helped in trying to deescalate another patient.  Type of Therapy:  Group Therapy - Process  Participation Level:  Active  Participation Quality:  Attentive  Affect:  Blunted  Cognitive:  Appropriate  Insight:  Developing/Improving  Engagement in Therapy:  Engaged  Modes of Intervention:  Clarification, Education, Exploration, Discussion  Ambrose Mantle, LCSW 09/08/2013, 1:09 PM

## 2013-09-08 NOTE — Clinical Social Work Note (Signed)
Clinical Social Work Note  CSW received phone message from patient's husband Doriana Mazurkiewicz [phone] and patient signed a Consent to Release Information allowing staff to talk with him.  Husband is concerned that several of patient's medical needs are not being addressed while she is here at [facility].  Specifically, he mentioned that she has a sleep disorder (does not recall the name) that may lead to her falling out of bed as she did last night.  He stated that this starts with her talking in her sleep, initially calmly and pleasantly, but escalating to unintelligible yelling, then a fall.  He stated her bed is supposed to be 6 inches lower at the foot than the head due to doctor recommendations, and he would like to see if it could be lowered closer to the ground.  He also is concerned that the patient is supposed to be receiving oxygen for orthostatic hypetension and obstructive pulmonary disease, at the rate of 2 liters per minute.  CSW advised the husband that per notes, a 1:1 has been ordered for nighttime to protect the patient from further incidents.  It was also shared with him that this facility does not use individual oxygen tanks for patients.  He stated he is more concerned about her medical treatment right now than her psychiatric treatment.  Writer did meet with patient's nurse and shared these concerns, and nurse was proceeding to talk to doctor and/or physician extender.  Husband later left a message requesting an extended visit with patient's daughter from [place] tomorrow, and this information was also shared with patient's nurse, who will assist in arranging this.  Ambrose Mantle, LCSW 09/08/2013, 4:56 PM

## 2013-09-08 NOTE — Progress Notes (Signed)
[facility] MD Progress Note  09/08/2013 10:30 AM Joyce Richardson  MRN:  [id number] Subjective:  Sleep is "good", reports falling out of bed and hitting her side, no bruise or mark, no issues ambulating, no signs and symptoms of pain.  Depression improving, suicidal ideations remain due to many stress, working on Pharmacologist, husband supportive and has locked his and her medications up--he distributes, she plans to return to [employer], she finds these more helpful than NA meetings.  1:1 at bedtime, she does not feel the need for any assistance during the day. Diagnosis:   DSM5:  Substance/Addictive Disorders:  Opioid Disorder - Moderate (304.00) Depressive Disorders:  Major Depressive Disorder - Severe (296.23)  Axis I: Anxiety Disorder NOS, Major Depression, Recurrent severe, Substance Abuse and Substance Induced Mood Disorder Axis II: Deferred Axis III:  Past Medical History  Diagnosis Date  . Multiple falls   . Orthostatic hypotension 05/17/12  . HYPERTENSION     hx. of; no longer on med. due to orthostatic hypotension  . ANXIETY   . Chronic diastolic heart failure   . DIABETES MELLITUS, TYPE II 2001    IDDM  . Carpal tunnel syndrome of right wrist 01/2013  . 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 Diabetic neuropathy     feet  . Hyperlipidemia   . History of substance abuse     Etoh and narcotics  . Diverticulosis   . DEPRESSION   . GERD   . SEIZURE DISORDER     no seizures in 3 yrs.  . Wears dentures     upper  . Sleep apnea 05/26/2012    sleep study:  AHI 7.5; RDI 10.7; CPAP not recommended  . 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 COPD (chronic obstructive pulmonary disease)     on home O2 at night, 2l/min.; denies SOB with daily activities   Axis IV: other psychosocial or environmental problems, problems related to social environment and problems with primary support group Axis V: 41-50 serious symptoms  ADL's:  Intact  Sleep: Good  Appetite:   Good  Suicidal Ideation:  Plan:  Overdose Intent:  yes Means:  none Homicidal Ideation:  Denies  Psychiatric Specialty Exam: Review of Systems  Constitutional: Negative.   HENT: Negative.   Eyes: Negative.   Respiratory: Negative.   Cardiovascular: Negative.   Gastrointestinal: Negative.   Genitourinary: Negative.   Musculoskeletal: Negative.   Skin: Negative.   Neurological: Negative.   Endo/Heme/Allergies: Negative.   Psychiatric/Behavioral: Positive for depression and suicidal ideas. The patient is nervous/anxious.     Blood pressure 107/55, pulse 79, temperature 98.2 F (36.8 C), temperature source Oral, resp. rate 16, height 5' (1.524 m), weight 80.74 kg (178 lb), SpO2 96.00%.Body mass index is 34.76 kg/(m^2).  General Appearance: Casual  Eye Contact::  Fair  Speech:  Normal Rate  Volume:  Normal  Mood:  Anxious and Depressed  Affect:  Congruent  Thought Process:  Coherent  Orientation:  Full (Time, Place, and Person)  Thought Content:  WDL  Suicidal Thoughts:  Yes.  with intent/plan  Homicidal Thoughts:  No  Memory:  Immediate;   Fair Recent;   Fair Remote;   Fair  Judgement:  Fair  Insight:  Fair  Psychomotor Activity:  Decreased  Concentration:  Fair  Recall:  Fair  Akathisia:  No  Handed:  Right  AIMS (if indicated):     Assets:  Resilience Social Support  Sleep:  Number of Hours:  4   Current Medications: Current Facility-Administered Medications  Medication Dose Route Frequency Provider Last Rate Last Dose  . acetaminophen (TYLENOL) tablet 650 mg  650 mg Oral Q6H PRN Cleotis Nipper, MD   650 mg at 09/08/13 0537  . alum & mag hydroxide-simeth (MAALOX/MYLANTA) 200-200-20 MG/5ML suspension 30 mL  30 mL Oral Q4H PRN Cleotis Nipper, MD      . atorvastatin (LIPITOR) tablet 10 mg  10 mg Oral Daily Cleotis Nipper, MD   10 mg at 09/08/13 0828  . clonazePAM (KLONOPIN) tablet 0.25 mg  0.25 mg Oral QHS PRN Cleotis Nipper, MD      . estrogens (conjugated)  (PREMARIN) tablet 0.625 mg  0.625 mg Oral Daily Cleotis Nipper, MD   0.625 mg at 09/08/13 1610  . haloperidol (HALDOL) tablet 1 mg  1 mg Oral Q6H PRN Cleotis Nipper, MD      . hydrOXYzine (ATARAX/VISTARIL) tablet 50 mg  50 mg Oral Q6H WA Cleotis Nipper, MD   50 mg at 09/08/13 0828  . insulin aspart (novoLOG) injection 0-15 Units  0-15 Units Subcutaneous TID WC Cleotis Nipper, MD   3 Units at 09/08/13 [phone]  . insulin glargine (LANTUS) injection 13 Units  13 Units Subcutaneous QHS Cleotis Nipper, MD   13 Units at 09/07/13 2109  . levETIRAcetam (KEPPRA XR) 24 hr tablet 500 mg  500 mg Oral BID Cleotis Nipper, MD   500 mg at 09/08/13 0828  . Liraglutide SOPN 1.8 mg  1.8 mg Subcutaneous QPC supper Mojeed Akintayo   1.8 mg at 09/07/13 1830  . magnesium hydroxide (MILK OF MAGNESIA) suspension 30 mL  30 mL Oral Daily PRN Cleotis Nipper, MD      . magnesium oxide (MAG-OX) tablet 200 mg  200 mg Oral Daily Cleotis Nipper, MD   200 mg at 09/08/13 0828  . nicotine (NICODERM CQ - dosed in mg/24 hours) patch 14 mg  14 mg Transdermal Q0600 Cleotis Nipper, MD   14 mg at 09/08/13 0631  . pantoprazole (PROTONIX) EC tablet 80 mg  80 mg Oral Daily Cleotis Nipper, MD   80 mg at 09/08/13 0828  . potassium chloride SA (K-DUR,KLOR-CON) CR tablet 20 mEq  20 mEq Oral BID Fransisca Kaufmann, NP   20 mEq at 09/08/13 5409  . pregabalin (LYRICA) capsule 600 mg  600 mg Oral QHS Cleotis Nipper, MD   600 mg at 09/07/13 2107  . pyridostigmine (MESTINON) tablet 60 mg  60 mg Oral BID Cleotis Nipper, MD   60 mg at 09/08/13 0828  . QUEtiapine (SEROQUEL) tablet 200 mg  200 mg Oral QHS Cleotis Nipper, MD   200 mg at 09/07/13 2107  . venlafaxine XR (EFFEXOR-XR) 24 hr capsule 75 mg  75 mg Oral Q breakfast Cleotis Nipper, MD   75 mg at 09/08/13 8119    Lab Results:  Results for orders placed during the hospital encounter of 09/06/13 (from the past 48 hour(s))  GLUCOSE, CAPILLARY     Status: Abnormal   Collection Time    09/06/13  5:32 PM      Result Value  Range   Glucose-Capillary 148 (*) 70 - 99 mg/dL  GLUCOSE, CAPILLARY     Status: Abnormal   Collection Time    09/06/13  9:47 PM      Result Value Range   Glucose-Capillary 125 (*) 70 - 99 mg/dL   Comment 1  Notify RN     Comment 2 Documented in Chart    COMPREHENSIVE METABOLIC PANEL     Status: Abnormal   Collection Time    09/07/13  6:21 AM      Result Value Range   Sodium 139  135 - 145 mEq/L   Potassium 3.3 (*) 3.5 - 5.1 mEq/L   Chloride 102  96 - 112 mEq/L   CO2 28  19 - 32 mEq/L   Glucose, Bld 87  70 - 99 mg/dL   BUN 10  6 - 23 mg/dL   Creatinine, Ser 4.09  0.50 - 1.10 mg/dL   Calcium 8.8  8.4 - 81.1 mg/dL   Total Protein 6.4  6.0 - 8.3 g/dL   Albumin 2.9 (*) 3.5 - 5.2 g/dL   AST 28  0 - 37 U/L   ALT 32  0 - 35 U/L   Alkaline Phosphatase 90  39 - 117 U/L   Total Bilirubin 0.3  0.3 - 1.2 mg/dL   GFR calc non Af Amer 89 (*) >90 mL/min   GFR calc Af Amer >90  >90 mL/min   Comment: (NOTE)     The eGFR has been calculated using the CKD EPI equation.     This calculation has not been validated in all clinical situations.     eGFR's persistently <90 mL/min signify possible Chronic Kidney     Disease.     Performed at [facility]  MAGNESIUM     Status: None   Collection Time    09/07/13  6:21 AM      Result Value Range   Magnesium 1.7  1.5 - 2.5 mg/dL   Comment: Performed at [facility]  GLUCOSE, CAPILLARY     Status: None   Collection Time    09/07/13  6:26 AM      Result Value Range   Glucose-Capillary 87  70 - 99 mg/dL   Comment 1 Notify RN     Comment 2 Documented in Chart    GLUCOSE, CAPILLARY     Status: Abnormal   Collection Time    09/07/13 12:05 PM      Result Value Range   Glucose-Capillary 220 (*) 70 - 99 mg/dL  GLUCOSE, CAPILLARY     Status: Abnormal   Collection Time    09/07/13  4:58 PM      Result Value Range   Glucose-Capillary 183 (*) 70 - 99 mg/dL  GLUCOSE, CAPILLARY     Status: Abnormal   Collection Time     09/07/13  8:56 PM      Result Value Range   Glucose-Capillary 162 (*) 70 - 99 mg/dL   Comment 1 Notify RN    GLUCOSE, CAPILLARY     Status: Abnormal   Collection Time    09/08/13 12:38 AM      Result Value Range   Glucose-Capillary 178 (*) 70 - 99 mg/dL   Comment 1 Notify RN    GLUCOSE, CAPILLARY     Status: Abnormal   Collection Time    09/08/13  2:38 AM      Result Value Range   Glucose-Capillary 153 (*) 70 - 99 mg/dL   Comment 1 Notify RN    GLUCOSE, CAPILLARY     Status: Abnormal   Collection Time    09/08/13  5:55 AM      Result Value Range   Glucose-Capillary 152 (*) 70 - 99 mg/dL   Comment  1 Notify RN      Physical Findings: AIMS: Facial and Oral Movements Muscles of Facial Expression: None, normal Lips and Perioral Area: None, normal Jaw: None, normal Tongue: None, normal,Extremity Movements Upper (arms, wrists, hands, fingers): None, normal Lower (legs, knees, ankles, toes): None, normal, Trunk Movements Neck, shoulders, hips: None, normal, Overall Severity Severity of abnormal movements (highest score from questions above): None, normal Incapacitation due to abnormal movements: None, normal Patient's awareness of abnormal movements (rate only patient's report): No Awareness, Dental Status Current problems with teeth and/or dentures?: Yes (left dentures at home, upper and lower) Does patient usually wear dentures?: Yes  CIWA:  CIWA-Ar Total: 3 COWS:  COWS Total Score: 4  Treatment Plan Summary: Daily contact with patient to assess and evaluate symptoms and progress in treatment Medication management  Plan:  Review of chart, vital signs, medications, and notes. 1-Individual and group therapy 2-Medication management for depression and anxiety:  Medications reviewed with the patient and she stated no untoward effects, no changes made--ibuprofen ordered for discomfort 3-Coping skills for depression and anxiety 4-Continue crisis stabilization and  management 5-Address health issues--monitoring vital signs, stable 6-Treatment plan in progress to prevent relapse of depression and anxiety  Medical Decision Making Problem Points:  Established problem, stable/improving (1) and Review of psycho-social stressors (1) Data Points:  Review of medication regiment & side effects (2)  I certify that inpatient services furnished can reasonably be expected to improve the patient's condition.   Nanine Means, PMH-NP 09/08/2013, 10:30 AM Agree with assessment and plan Madie Reno A.Dub Mikes, M.D.

## 2013-09-08 NOTE — Progress Notes (Signed)
Patient ID: AFUA HOOTS, female   DOB: [birth year], 67 y.o.   MRN: [id number] 1-1 Monitoring Note. D. Patient noted to be HIGH FALL RISK, 1/1 ordered for patient safety. Patient gait steady, neuro assessment intact. Patient states '' I can walk just fine, I'm not going to fall, I just rolled out of bed. Patient educated to keep side rails up at night. 1/1 remains at arms length for safety. R. Patient remains safe on the unit.

## 2013-09-08 NOTE — Progress Notes (Signed)
Patient ID: Joyce Richardson, female   DOB: [birth year], 67 y.o.   MRN: [id number] 1:1 Nursing Note: The patient is a 1:1 when sleeping due to a sleep disorder that makes her a high fall risk. The patient had a great evening. She was social and pleasant interacting appropriately in the milieu. Attended and actively participated in evening wrap up group. Shared with group her coping mechanism is making jewelry. She is able to self sooth herself designing and putting jewelry together. She also shared that she was very disappointed this weekend that her daughter was in town but couldn't find time to visit her. Stated that she had to just let it go because it was too hurtful to hang onto. The patient was very supportive of others in the group. Denied any suicidal/homicidal ideation. Denied any a/v hallucinations. Compliant with medication. 1:1 maintained for safety. Will continue to monitor as part of her fall risk intervention.

## 2013-09-09 LAB — GLUCOSE, CAPILLARY
Glucose-Capillary: 150 mg/dL — ABNORMAL HIGH (ref 70–99)
Glucose-Capillary: 225 mg/dL — ABNORMAL HIGH (ref 70–99)

## 2013-09-09 NOTE — Progress Notes (Signed)
Subjective/Objective No Subjective & Objective Note on file.   Scheduled Meds: . atorvastatin  10 mg Oral Daily  . estrogens (conjugated)  0.625 mg Oral Daily  . hydrOXYzine  50 mg Oral Q6H WA  . insulin aspart  0-15 Units Subcutaneous TID WC  . insulin glargine  13 Units Subcutaneous QHS  . levETIRAcetam  500 mg Oral BID  . Liraglutide  1.8 mg Subcutaneous QPC supper  . magnesium oxide  200 mg Oral Daily  . nicotine  14 mg Transdermal Q0600  . pantoprazole  80 mg Oral Daily  . potassium chloride  20 mEq Oral BID  . pregabalin  600 mg Oral QHS  . pyridostigmine  60 mg Oral BID  . QUEtiapine  200 mg Oral QHS  . venlafaxine XR  75 mg Oral Q breakfast   Continuous Infusions:  PRN Meds:alum & mag hydroxide-simeth, clonazePAM, haloperidol, ibuprofen, magnesium hydroxide  Vital signs in last 24 hours: Temp:  [97.3 F (36.3 C)-98.1 F (36.7 C)] 97.3 F (36.3 C) (10/19 0700) Pulse Rate:  [65-74] 74 (10/19 0701) Resp:  [18] 18 (10/19 0700) BP: (102-138)/(63-78) 102/65 mmHg (10/19 0701) SpO2:  [98 %] 98 % (10/18 2355)  Intake/Output last 3 shifts:   Intake/Output this shift:    Problem Assessment/Plan No new assessment & plan notes have been filed since the last note was generated.

## 2013-09-09 NOTE — Progress Notes (Signed)
Patient ID: Joyce Richardson, female   DOB: [birth year], 67 y.o.   MRN: [id number] 1:1 Nursing Notes: The patient continues to rest in bed with eyes closed. No distress noted. Side rails remain up as per patient's request. 1:1 maintained for safety. Will continue to monitor.

## 2013-09-09 NOTE — Progress Notes (Signed)
1 - 1 note D. Pt in bed and resting comfortably at this time. Pt has been in bed resting, however, did arouse and did come to medication window for all scheduled medications earlier. Pt did rate pain as an 8 in rig cage but stated "it's nothing I can't handle". Pt dd not verbalize any other complaints or concerns at this time. A.  1-1 provided at this time, safety maintained at present. R. Will continue to monitor.

## 2013-09-09 NOTE — Progress Notes (Addendum)
[facility] MD Progress Note  09/09/2013 1:21 PM Joyce Richardson  MRN:  [id number] Subjective:  Today the patient is saying she was only trying to get high, not to kill herself, brighter affect, depression decreased, sleep initation poor but sleep was "good", appetite is fair, manipulating to go home due to oxygen use not allowed here, oxygen set up at home. She never mentioned it yesterday, wants to go home. Diagnosis:   DSM5:  Substance/Addictive Disorders:  Alcohol Related Disorder - Severe (303.90) and Opioid Disorder - Moderate (304.00) Depressive Disorders:  Major Depressive Disorder - Severe (296.23)  Axis I: Anxiety Disorder NOS, Major Depression, Recurrent severe and Substance Induced Mood Disorder Axis II: Deferred Axis III:  Past Medical History  Diagnosis Date  . Multiple falls   . Orthostatic hypotension 05/17/12  . HYPERTENSION     hx. of; no longer on med. due to orthostatic hypotension  . ANXIETY   . Chronic diastolic heart failure   . DIABETES MELLITUS, TYPE II 2001    IDDM  . Carpal tunnel syndrome of right wrist 01/2013  . 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 Diabetic neuropathy     feet  . Hyperlipidemia   . History of substance abuse     Etoh and narcotics  . Diverticulosis   . DEPRESSION   . GERD   . SEIZURE DISORDER     no seizures in 3 yrs.  . Wears dentures     upper  . Sleep apnea 05/26/2012    sleep study:  AHI 7.5; RDI 10.7; CPAP not recommended  . 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 COPD (chronic obstructive pulmonary disease)     on home O2 at night, 2l/min.; denies SOB with daily activities   Axis IV: other psychosocial or environmental problems, problems related to social environment and problems with primary support group Axis V: 41-50 serious symptoms  ADL's:  Intact  Sleep: Good  Appetite:  Fair  Suicidal Ideation:  Plan:  none Intent:  none Means:  none Homicidal Ideation:  Denies  Psychiatric Specialty  Exam: Review of Systems  Constitutional: Negative.   HENT: Negative.   Eyes: Negative.   Respiratory: Negative.   Cardiovascular: Negative.   Gastrointestinal: Negative.   Genitourinary: Negative.   Musculoskeletal: Negative.   Skin: Negative.   Neurological: Negative.   Endo/Heme/Allergies: Negative.   Psychiatric/Behavioral: Positive for depression and suicidal ideas. The patient is nervous/anxious.     Blood pressure 102/65, pulse 74, temperature 97.3 F (36.3 C), temperature source Oral, resp. rate 18, height 5' (1.524 m), weight 80.74 kg (178 lb), SpO2 98.00%.Body mass index is 34.76 kg/(m^2).  General Appearance: Casual  Eye Contact::  Fair  Speech:  Normal Rate  Volume:  Normal  Mood:  Anxious and Depressed  Affect:  Congruent  Thought Process:  Coherent  Orientation:  Full (Time, Place, and Person)  Thought Content:  WDL  Suicidal Thoughts:  Yes.  without intent/plan  Homicidal Thoughts:  No  Memory:  Immediate;   Fair Recent;   Fair Remote;   Fair  Judgement:  Poor  Insight:  Fair  Psychomotor Activity:  Normal  Concentration:  Fair  Recall:  Fair  Akathisia:  No  Handed:  Right  AIMS (if indicated):     Assets:  Resilience Social Support  Sleep:  Number of Hours: 5.5   Current Medications: Current Facility-Administered Medications  Medication Dose Route Frequency Provider Last Rate Last Dose  . alum &  mag hydroxide-simeth (MAALOX/MYLANTA) 200-200-20 MG/5ML suspension 30 mL  30 mL Oral Q4H PRN Cleotis Nipper, MD      . atorvastatin (LIPITOR) tablet 10 mg  10 mg Oral Daily Cleotis Nipper, MD   10 mg at 09/09/13 0803  . clonazePAM (KLONOPIN) tablet 0.25 mg  0.25 mg Oral QHS PRN Cleotis Nipper, MD      . estrogens (conjugated) (PREMARIN) tablet 0.625 mg  0.625 mg Oral Daily Cleotis Nipper, MD   0.625 mg at 09/09/13 0803  . haloperidol (HALDOL) tablet 1 mg  1 mg Oral Q6H PRN Cleotis Nipper, MD      . hydrOXYzine (ATARAX/VISTARIL) tablet 50 mg  50 mg Oral Q6H WA Cleotis Nipper, MD   50 mg at 09/08/13 2126  . ibuprofen (ADVIL,MOTRIN) tablet 800 mg  800 mg Oral Q6H PRN Nanine Means, NP   800 mg at 09/08/13 2351  . insulin aspart (novoLOG) injection 0-15 Units  0-15 Units Subcutaneous TID WC Cleotis Nipper, MD   5 Units at 09/09/13 1217  . insulin glargine (LANTUS) injection 13 Units  13 Units Subcutaneous QHS Cleotis Nipper, MD   13 Units at 09/08/13 2126  . levETIRAcetam (KEPPRA XR) 24 hr tablet 500 mg  500 mg Oral BID Cleotis Nipper, MD   500 mg at 09/09/13 0803  . Liraglutide SOPN 1.8 mg  1.8 mg Subcutaneous QPC supper Mojeed Akintayo   1.8 mg at 09/07/13 1830  . magnesium hydroxide (MILK OF MAGNESIA) suspension 30 mL  30 mL Oral Daily PRN Cleotis Nipper, MD      . magnesium oxide (MAG-OX) tablet 200 mg  200 mg Oral Daily Cleotis Nipper, MD   200 mg at 09/09/13 0802  . nicotine (NICODERM CQ - dosed in mg/24 hours) patch 14 mg  14 mg Transdermal Q0600 Cleotis Nipper, MD   14 mg at 09/09/13 0651  . pantoprazole (PROTONIX) EC tablet 80 mg  80 mg Oral Daily Cleotis Nipper, MD   80 mg at 09/09/13 0802  . potassium chloride SA (K-DUR,KLOR-CON) CR tablet 20 mEq  20 mEq Oral BID Fransisca Kaufmann, NP   20 mEq at 09/09/13 0803  . pregabalin (LYRICA) capsule 600 mg  600 mg Oral QHS Cleotis Nipper, MD   600 mg at 09/08/13 2126  . pyridostigmine (MESTINON) tablet 60 mg  60 mg Oral BID Cleotis Nipper, MD   60 mg at 09/09/13 0802  . QUEtiapine (SEROQUEL) tablet 200 mg  200 mg Oral QHS Cleotis Nipper, MD   200 mg at 09/08/13 2126  . venlafaxine XR (EFFEXOR-XR) 24 hr capsule 75 mg  75 mg Oral Q breakfast Cleotis Nipper, MD   75 mg at 09/09/13 1610    Lab Results:  Results for orders placed during the hospital encounter of 09/06/13 (from the past 48 hour(s))  GLUCOSE, CAPILLARY     Status: Abnormal   Collection Time    09/07/13  4:58 PM      Result Value Range   Glucose-Capillary 183 (*) 70 - 99 mg/dL  GLUCOSE, CAPILLARY     Status: Abnormal   Collection Time    09/07/13  8:56 PM       Result Value Range   Glucose-Capillary 162 (*) 70 - 99 mg/dL   Comment 1 Notify RN    GLUCOSE, CAPILLARY     Status: Abnormal   Collection Time    09/08/13 12:38 AM  Result Value Range   Glucose-Capillary 178 (*) 70 - 99 mg/dL   Comment 1 Notify RN    GLUCOSE, CAPILLARY     Status: Abnormal   Collection Time    09/08/13  2:38 AM      Result Value Range   Glucose-Capillary 153 (*) 70 - 99 mg/dL   Comment 1 Notify RN    GLUCOSE, CAPILLARY     Status: Abnormal   Collection Time    09/08/13  5:55 AM      Result Value Range   Glucose-Capillary 152 (*) 70 - 99 mg/dL   Comment 1 Notify RN    GLUCOSE, CAPILLARY     Status: Abnormal   Collection Time    09/08/13 11:38 AM      Result Value Range   Glucose-Capillary 204 (*) 70 - 99 mg/dL  GLUCOSE, CAPILLARY     Status: Abnormal   Collection Time    09/08/13  5:08 PM      Result Value Range   Glucose-Capillary 171 (*) 70 - 99 mg/dL  GLUCOSE, CAPILLARY     Status: Abnormal   Collection Time    09/08/13  8:52 PM      Result Value Range   Glucose-Capillary 214 (*) 70 - 99 mg/dL   Comment 1 Notify RN    GLUCOSE, CAPILLARY     Status: Abnormal   Collection Time    09/09/13  5:57 AM      Result Value Range   Glucose-Capillary 150 (*) 70 - 99 mg/dL   Comment 1 Notify RN    GLUCOSE, CAPILLARY     Status: Abnormal   Collection Time    09/09/13 12:01 PM      Result Value Range   Glucose-Capillary 207 (*) 70 - 99 mg/dL   Comment 1 Notify RN      Physical Findings: AIMS: Facial and Oral Movements Muscles of Facial Expression: None, normal Lips and Perioral Area: None, normal Jaw: None, normal Tongue: None, normal,Extremity Movements Upper (arms, wrists, hands, fingers): None, normal Lower (legs, knees, ankles, toes): None, normal, Trunk Movements Neck, shoulders, hips: None, normal, Overall Severity Severity of abnormal movements (highest score from questions above): None, normal Incapacitation due to abnormal movements:  None, normal Patient's awareness of abnormal movements (rate only patient's report): No Awareness, Dental Status Current problems with teeth and/or dentures?: Yes (left dentures at home, upper and lower) Does patient usually wear dentures?: Yes  CIWA:  CIWA-Ar Total: 3 COWS:  COWS Total Score: 4  Treatment Plan Summary: Daily contact with patient to assess and evaluate symptoms and progress in treatment Medication management  Plan:  Review of chart, vital signs, medications, and notes. 1-Individual and group therapy 2-Medication management for depression, substance abuse, and anxiety:  Medications reviewed with the patient and she stated she did not need have any untoward effects, no changes made 3-Coping skills for depression, anxiety, and substance abuse 4-Continue crisis stabilization and management 5-Address health issues--monitoring vital signs, stable 6-Treatment plan in progress to prevent relapse of depression, substance abuse, and anxiety  Medical Decision Making Problem Points:  Established problem, stable/improving (1) and Review of psycho-social stressors (1) Data Points:  Review of medication regiment & side effects (2)  I certify that inpatient services furnished can reasonably be expected to improve the patient's condition.   Nanine Means, PMH-NP 09/09/2013, 1:22 PM  Agree with assessment and plan Madie Reno A. Dub Mikes, M.D.

## 2013-09-09 NOTE — Progress Notes (Signed)
Patient ID: Joyce Richardson, female   DOB: Mar 17, 1946, 67 y.o.   MRN: [id number] D. Patient presents with depressed mood, but brightens upon approach. Patient continues to be manipulative regarding discharge, denying that she was suicidal upon admission, minimizing her overdose attempt, and demanding that ''the hospital is not dealing with my medical concerns that I had so I might as well just go home. I want to sleep in my special bed and the bed here the feet don't go down, and I don't have my oxygen here either. '' A. Support and encouragement provided. Discussed above information regarding patients requests/behaviors. Medications given as ordered. R. Patient has been visible in the milieu, attending unit programming. Will continue to monitor q 15 minutes for safety.

## 2013-09-09 NOTE — [facility] Group Notes (Signed)
[facility] Group Notes:  (Nursing/MHT/Case Management/Adjunct)  Date:  09/09/2013  Time:  10:29 AM  Type of Therapy:  Psychoeducational Skills  Participation Level:  Active  Participation Quality:  Appropriate  Affect:  Irritable  Cognitive:  Alert and Appropriate  Insight:  Improving  Engagement in Group:  Improving  Modes of Intervention:  Discussion, Education and Exploration  Summary of Progress/Problems: Psycho-educational review with RN , healthy support systems . Pt reports ''I know what changes I need to make so I can get to where I need to be. I want to move to [facility] and see my mom and I'm not going to get there crying all the time and being depressed.  Malva Limes 09/09/2013, 10:29 AM

## 2013-09-09 NOTE — [facility] Group Notes (Signed)
[facility] Group Notes:  (Clinical Social Work)  09/09/2013   11:15am-12:00pm  Summary of Progress/Problems:  The main focus of today's process group was to listen to a variety of genres of music and to identify that different types of music provoke different responses.  The patient then was able to identify personally what was soothing for them, as well as energizing.  Handouts were used to record feelings evoked, as well as how patient can personally use this knowledge in sleep habits, with depression, and with other symptoms.  The patient expressed understanding of concepts, as well as knowledge of how each type of music affected them and how this can be used when they are at home as a tool in their recovery.  Type of Therapy:  Music Therapy   Participation Level:  Active  Participation Quality:  Attentive and Sharing  Affect:  Blunted  Cognitive:  Oriented  Insight:  Engaged  Engagement in Therapy:  Engaged  Modes of Intervention:   Activity, Exploration  Neiva Maenza Grossman-Orr, LCSW 09/09/2013, 12:51 PM     

## 2013-09-09 NOTE — Progress Notes (Signed)
Patient ID: Joyce Richardson, female   DOB: Sep 28, 1946, 67 y.o.   MRN: [id number] 1:1 Nursing Note: The patient is resting in bed with eyes closed at this time. Side rails are up at patient's request. Earlier stated that she is supposed to sleep with oxygen and wanted her O2 sat checked. It was within normal range at 98%. 1:1 maintained for safety. Will continue to monitor.

## 2013-09-09 NOTE — Progress Notes (Signed)
Psychoeducational Group Note  Date:  09/09/2013 Time:  2000  Group Topic/Focus:  Wrap-Up Group:   The focus of this group is to help patients review their daily goal of treatment and discuss progress on daily workbooks.  Participation Level: Did Not Attend  Participation Quality:  Not Applicable  Affect:  Not Applicable  Cognitive:  Not Applicable  Insight:  Not Applicable  Engagement in Group: Not Applicable  Additional Comments:  The patient did not attend group this evening since she was sleeping in her bed.   Hazle Coca S 09/09/2013, 11:24 PM

## 2013-09-09 NOTE — Clinical Social Work Note (Signed)
Clinical Social Work Note  After arranging for patient's daughter from [place] to have an extended visit with patient, CSW checked with patient to ask how this had gone.  She stated it was "horrible" and had only lasted 10 minutes before her daughter "stormed out."  She said that the first thing her daughter talked about was how "stupid" it is for the patient and the patient's husband to be living in a condo instead of in a nursing home.  When the patient told her that they moved into a condo as a step, because they are not yet 68 and they are not yet ready to move into a nursing home, the daughter stormed out of the cafeteria.  Her son-in-law encouraged her to just agree with whatever her daughter says in order to keep peace, but the patient is sufficiently insightful to decline doing this.  She did enjoy visiting with her son-in-law and grandson.  She is hopeful that she will go home tomorrow, in fact is expecting it.  Ambrose Mantle, LCSW 09/09/2013, 4:43 PM

## 2013-09-10 LAB — GLUCOSE, CAPILLARY
Glucose-Capillary: 172 mg/dL — ABNORMAL HIGH (ref 70–99)
Glucose-Capillary: 164 mg/dL — ABNORMAL HIGH (ref 70–99)

## 2013-09-10 MED ORDER — QUETIAPINE FUMARATE 300 MG PO TABS
300.0000 mg | ORAL_TABLET | Freq: Every day | ORAL | Status: DC
  Administered 2013-09-10 – 2013-09-11 (×2): 300 mg via ORAL
  Filled 2013-09-10 (×2): qty 2
  Filled 2013-09-10 (×3): qty 1

## 2013-09-10 MED ORDER — VENLAFAXINE HCL ER 150 MG PO CP24
150.0000 mg | ORAL_CAPSULE | Freq: Every day | ORAL | Status: DC
  Administered 2013-09-11 – 2013-09-12 (×2): 150 mg via ORAL
  Filled 2013-09-10: qty 2
  Filled 2013-09-10: qty 1
  Filled 2013-09-10: qty 2
  Filled 2013-09-10 (×2): qty 1

## 2013-09-10 MED ORDER — OLANZAPINE 10 MG PO TBDP: 10.0000 mg | ORAL_TABLET | Freq: Three times a day (TID) | ORAL | Status: DC | PRN

## 2013-09-10 NOTE — Progress Notes (Signed)
D: Pt denies SI/HI/AVH. Pt stated that she is ready to go home today and no longer feels suicidal. Pt rates depression and hopeless at 1/10. Pt stated that she have developed coping skills to help her not become suicidal in the future. Per pt, she enjoy attending groups and is interested in attending IOP three days a week. A: Medications administered as ordered per MD. Verbal support given. Pt encouraged to attend groups. 15 minute checks performed for safety. R: Pt refused scheduled am vistaril. Pt stated that the vistaril makes her drowsy in the morning. Pt safety maintained.

## 2013-09-10 NOTE — Telephone Encounter (Signed)
This patient had been seen by me for a one-time visit as a transfer from Dr. Sandria Manly. She will need to be a assigned to another provider in the group, as previously discussed, regarding Dr. Imagene Gurney patients.

## 2013-09-10 NOTE — Progress Notes (Signed)
D: Patient in the dayroom on first approach.  Patient animated and talkative with Clinical research associate.  Patient states she fell out of bed the first night she was here due to having nightmares.  Patient states she wants to make sure she has a Journalist, newspaper that is not going to talk to her all night.  Patient is to be on 1:1 for safety when she goes to sleep.  Patient states the motrin she was given earlier today helped with her pain.  Patient states she is learning to cope with her problems ans learning to deal with her family.  Patient denies SI/HI and denies AVH. A: Staff to monitor Q 15 mins for safety.  Encouragement and support offered.  Scheduled medications administered per orders. R: Patient remains safe on the unit.  Patient attended group tonight.  Patient visible on the unit and interacting with peers.  Patient taking administered medications.   Patient 1:1 started tonight at 2115

## 2013-09-10 NOTE — Progress Notes (Addendum)
0200 am:  Patient awake at the beginning of this shift at 2300. She reported having difficulty sleeping and appeared restless. Writer offered patient PRN Klonopin 0.25 mg. Patient received medication without difficulty and has been sleeping since then. 1:1 observation continues as ordered to maintain safety.  0600 am: Patient slept through the night. She continues sleeping till I checked on her at 0600 am. Her respirations even and unlabored. No distress noted. 1:1 observations while asleep continues as ordered to maintain safety.

## 2013-09-10 NOTE — Telephone Encounter (Signed)
Noted  

## 2013-09-10 NOTE — Progress Notes (Signed)
Recreation Therapy Notes  Date: 10.20.2014 Time: 9:30am Location: [address] Dayroom   Group Topic: Coping Skills  Goal Area(s) Addresses:  Patient will express themselves through the use of art.  Patient will identify what positive changes have been made in their lives.   Behavioral Response: Engaged, Sharing  Intervention: Art  Activity: Two Faces of Me. Patient was asked to depict themselves at admission and at d/c in drawing.    Education: Discharge Planning.    Education Outcome: Acknowledges Education.   Clinical Observations/Feedback: Patient Joyce Richardson engaged in activity. Patient drew "Zya" on one side of her paper, representing herself at admission. Patient stated she does not go by "[name]" but that is what everyone called her at admission, patient drew "[name]" with tears on her face. On the opposing side of the paper patient drew "[name]" patient identifies as [name], stating that she feels more like herself today than when she admitted. Patient additionally stated that she feels this change because she has been attending groups and taking her medication. Patient also shared that she will continue to attend therapy appointments and support groups recommended by LCSW post d/c to maintain positive mood.   Patient additionally shared that Dr. Mervyn Skeeters. Stated she would not be d/c today, as she had expected. Patient shared that previously this would have made her very angry and she would have acted out, however because she is more in control of herself she was able to take this news in stride. Patient appeared impressed and happy with her behavior.   Marykay Lex Analise Glotfelty, LRT/CTRS  Hedwig Mcfall L 09/10/2013 11:44 AM

## 2013-09-10 NOTE — Progress Notes (Signed)
Adult Psychoeducational Group Note  Date:  09/10/2013 Time:  2:37 PM  Group Topic/Focus:  Dimensions of Wellness:   The focus of this group is to introduce the topic of wellness and discuss the role each dimension of wellness plays in total health.  Participation Level:  Active  Participation Quality:  Appropriate, Sharing and Supportive  Affect:  Appropriate  Cognitive:  Alert and Appropriate  Insight: Appropriate  Engagement in Group:  Engaged and Supportive  Modes of Intervention:  Discussion and Support  Additional Comments:  Patient shared about issues with patient and her daughter.  Joyce Richardson 09/10/2013, 2:37 PM

## 2013-09-10 NOTE — [facility] Group Notes (Signed)
[facility] LCSW Aftercare Discharge Planning Group Note   09/10/2013 9:49 AM  Participation Quality:  Engaged  Mood/Affect:  Appropriate  Depression Rating:  denies  Anxiety Rating:  "high"  Thoughts of Suicide:  No Will you contract for safety?   NA  Current AVH:  No  Plan for Discharge/Comments:  Joyce Richardson states that she is anxious because she really wants to be d/ced today, and is afraid she will not be able to go.  When asked what will be different when she leaves, she says she will be taking oxygen, which she really needs and is not able to get here.  I clarified that I was talking about her safety.  She stated that the code on the safe has been changed, and she will no longer be able to access the patches.  Also states she needs to reconnect with her 8AM AA group-[facility].  Is also open to CD IOP.  I will alert Thurston Hole.  Transportation Means:  family  Supports: family  [place], Baldo Daub

## 2013-09-10 NOTE — Progress Notes (Signed)
Patient ID: Joyce Richardson, female   DOB: Jul 09, 1946, 67 y.o.   MRN: [id number] [facility] MD Progress Note  09/10/2013 11:05 AM SYLINA HENION  MRN:  [id number] Subjective: "I want to go home, my family visited me over the weekend and it was an eye opener for me. I think they will be happy if I am alive." Objective: Patient is demanding to go home, however, she is still showing poor insight into her problem. She has attempted suicidal twice in the last few weeks by chewing on Fentanyl patch but says she was just trying to get "high". She continues to be argumentative, gets easily agitated and irritable. She is compliant with her medications and has not endorsed any adverse reactions. Diagnosis:   DSM5:  Substance/Addictive Disorders:  Alcohol Related Disorder - Severe (303.90) and Opioid Disorder - Moderate (304.00) Depressive Disorders:  Major Depressive Disorder - Severe (296.23)  Axis I: Anxiety Disorder NOS, Major Depression, Recurrent severe and Substance Induced Mood Disorder Axis II: Deferred Axis III:  Past Medical History  Diagnosis Date  . Multiple falls   . Orthostatic hypotension 05/17/12  . HYPERTENSION     hx. of; no longer on med. due to orthostatic hypotension  . Chronic diastolic heart failure   . DIABETES MELLITUS, TYPE II 2001    IDDM  . Carpal tunnel syndrome of right wrist 01/2013  . 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 Diabetic neuropathy     feet  . Hyperlipidemia   . History of substance abuse     Etoh and narcotics  . Diverticulosis   . GERD   . SEIZURE DISORDER     no seizures in 3 yrs.  . Wears dentures     upper  . Sleep apnea 05/26/2012    sleep study:  AHI 7.5; RDI 10.7; CPAP not recommended  . 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 COPD (chronic obstructive pulmonary disease)     on home O2 at night, 2l/min.; denies SOB with daily activities   Axis IV: other psychosocial or environmental problems, problems related to social  environment and problems with primary support group Axis V: 41-50 serious symptoms  ADL's:  Intact  Sleep: Good  Appetite:  Fair  Suicidal Ideation:  Plan:  none Intent:  none Means:  none Homicidal Ideation:  Denies  Psychiatric Specialty Exam: Review of Systems  Constitutional: Negative.   HENT: Negative.   Eyes: Negative.   Respiratory: Negative.   Cardiovascular: Negative.   Gastrointestinal: Negative.   Genitourinary: Negative.   Musculoskeletal: Negative.   Skin: Negative.   Neurological: Negative.   Endo/Heme/Allergies: Negative.   Psychiatric/Behavioral: Positive for depression and suicidal ideas. The patient is nervous/anxious.     Blood pressure 103/68, pulse 91, temperature 98.4 F (36.9 C), temperature source Oral, resp. rate 18, height 5' (1.524 m), weight 80.74 kg (178 lb), SpO2 98.00%.Body mass index is 34.76 kg/(m^2).  General Appearance: Casual  Eye Contact::  Fair  Speech:  Normal Rate  Volume:  Normal  Mood:  Anxious and Depressed  Affect:  Congruent  Thought Process:  Coherent  Orientation:  Full (Time, Place, and Person)  Thought Content:  WDL  Suicidal Thoughts:  Yes.  without intent/plan  Homicidal Thoughts:  No  Memory:  Immediate;   Fair Recent;   Fair Remote;   Fair  Judgement:  Poor  Insight:  Fair  Psychomotor Activity:  Normal  Concentration:  Fair  Recall:  Fair  Akathisia:  No  Handed:  Right  AIMS (if indicated):     Assets:  Resilience Social Support  Sleep:  Number of Hours: 4.25   Current Medications: Current Facility-Administered Medications  Medication Dose Route Frequency Provider Last Rate Last Dose  . alum & mag hydroxide-simeth (MAALOX/MYLANTA) 200-200-20 MG/5ML suspension 30 mL  30 mL Oral Q4H PRN Cleotis Nipper, MD      . atorvastatin (LIPITOR) tablet 10 mg  10 mg Oral Daily Cleotis Nipper, MD   10 mg at 09/10/13 0755  . estrogens (conjugated) (PREMARIN) tablet 0.625 mg  0.625 mg Oral Daily Cleotis Nipper, MD    0.625 mg at 09/10/13 0755  . hydrOXYzine (ATARAX/VISTARIL) tablet 50 mg  50 mg Oral Q6H WA Cleotis Nipper, MD   50 mg at 09/09/13 1936  . ibuprofen (ADVIL,MOTRIN) tablet 800 mg  800 mg Oral Q6H PRN Nanine Means, NP   800 mg at 09/10/13 0818  . insulin aspart (novoLOG) injection 0-15 Units  0-15 Units Subcutaneous TID WC Cleotis Nipper, MD   2 Units at 09/10/13 0700  . insulin glargine (LANTUS) injection 13 Units  13 Units Subcutaneous QHS Cleotis Nipper, MD   13 Units at 09/09/13 2127  . levETIRAcetam (KEPPRA XR) 24 hr tablet 500 mg  500 mg Oral BID Cleotis Nipper, MD   500 mg at 09/10/13 0755  . Liraglutide SOPN 1.8 mg  1.8 mg Subcutaneous QPC supper Sharona Rovner   1.8 mg at 09/09/13 1812  . magnesium hydroxide (MILK OF MAGNESIA) suspension 30 mL  30 mL Oral Daily PRN Cleotis Nipper, MD      . magnesium oxide (MAG-OX) tablet 200 mg  200 mg Oral Daily Cleotis Nipper, MD   200 mg at 09/10/13 0757  . nicotine (NICODERM CQ - dosed in mg/24 hours) patch 14 mg  14 mg Transdermal Q0600 Cleotis Nipper, MD   14 mg at 09/10/13 0600  . OLANZapine zydis (ZYPREXA) disintegrating tablet 10 mg  10 mg Oral Q8H PRN Corin Formisano      . pantoprazole (PROTONIX) EC tablet 80 mg  80 mg Oral Daily Cleotis Nipper, MD   80 mg at 09/10/13 0754  . pregabalin (LYRICA) capsule 600 mg  600 mg Oral QHS Cleotis Nipper, MD   600 mg at 09/09/13 2125  . pyridostigmine (MESTINON) tablet 60 mg  60 mg Oral BID Cleotis Nipper, MD   60 mg at 09/10/13 0754  . QUEtiapine (SEROQUEL) tablet 200 mg  200 mg Oral QHS Cleotis Nipper, MD   200 mg at 09/09/13 2125  . venlafaxine XR (EFFEXOR-XR) 24 hr capsule 75 mg  75 mg Oral Q breakfast Cleotis Nipper, MD   75 mg at 09/10/13 1610    Lab Results:  Results for orders placed during the hospital encounter of 09/06/13 (from the past 48 hour(s))  GLUCOSE, CAPILLARY     Status: Abnormal   Collection Time    09/08/13 11:38 AM      Result Value Range   Glucose-Capillary 204 (*) 70 - 99 mg/dL  GLUCOSE,  CAPILLARY     Status: Abnormal   Collection Time    09/08/13  5:08 PM      Result Value Range   Glucose-Capillary 171 (*) 70 - 99 mg/dL  GLUCOSE, CAPILLARY     Status: Abnormal   Collection Time    09/08/13  8:52 PM      Result Value Range  Glucose-Capillary 214 (*) 70 - 99 mg/dL   Comment 1 Notify RN    GLUCOSE, CAPILLARY     Status: Abnormal   Collection Time    09/09/13  5:57 AM      Result Value Range   Glucose-Capillary 150 (*) 70 - 99 mg/dL   Comment 1 Notify RN    GLUCOSE, CAPILLARY     Status: Abnormal   Collection Time    09/09/13 12:01 PM      Result Value Range   Glucose-Capillary 207 (*) 70 - 99 mg/dL   Comment 1 Notify RN    GLUCOSE, CAPILLARY     Status: Abnormal   Collection Time    09/09/13  5:07 PM      Result Value Range   Glucose-Capillary 225 (*) 70 - 99 mg/dL  GLUCOSE, CAPILLARY     Status: Abnormal   Collection Time    09/09/13  9:08 PM      Result Value Range   Glucose-Capillary 120 (*) 70 - 99 mg/dL  GLUCOSE, CAPILLARY     Status: Abnormal   Collection Time    09/10/13  6:14 AM      Result Value Range   Glucose-Capillary 145 (*) 70 - 99 mg/dL   Comment 1 Notify RN      Physical Findings: AIMS: Facial and Oral Movements Muscles of Facial Expression: None, normal Lips and Perioral Area: None, normal Jaw: None, normal Tongue: None, normal,Extremity Movements Upper (arms, wrists, hands, fingers): None, normal Lower (legs, knees, ankles, toes): None, normal, Trunk Movements Neck, shoulders, hips: None, normal, Overall Severity Severity of abnormal movements (highest score from questions above): None, normal Incapacitation due to abnormal movements: None, normal Patient's awareness of abnormal movements (rate only patient's report): No Awareness, Dental Status Current problems with teeth and/or dentures?: Yes (left dentures at home, upper and lower) Does patient usually wear dentures?: Yes  CIWA:  CIWA-Ar Total: 3 COWS:  COWS Total Score:  4  Treatment Plan Summary: Daily contact with patient to assess and evaluate symptoms and progress in treatment Medication management  Plan:  Review of chart, vital signs, medications, and notes. 1-Individual and group therapy 2-Medication management for depression, substance abuse, and anxiety:  Medications reviewed with the patient and she stated she did not need have any untoward effects, no changes made 3-Coping skills for depression, anxiety, and substance abuse 4-Continue crisis stabilization and management 5-Address health issues--monitoring vital signs, stable 6-Treatment plan in progress to prevent relapse of depression, substance abuse, and anxiety 7. Increase Effexor XR to 150mg  po daily for depression. 8. Increase Seroquel to 300mg  po Qhs for mood lability.  Medical Decision Making Problem Points:  Established problem, stable/improving (1) and Review of psycho-social stressors (1) Data Points:  Review of medication regiment & side effects (2)  I certify that inpatient services furnished can reasonably be expected to improve the patient's condition.   Thedore Mins, MD 09/10/2013, 11:05 AM

## 2013-09-10 NOTE — [facility] Group Notes (Signed)
[facility] LCSW Group Therapy  09/10/2013 1:15 pm  Type of Therapy: Process Group Therapy  Participation Level:  Active  Participation Quality:  Appropriate  Affect:  Flat  Cognitive:  Oriented  Insight:  Improving  Engagement in Group:  Limited  Engagement in Therapy:  Limited  Modes of Intervention:  Activity, Clarification, Education, Problem-solving and Support  Summary of Progress/Problems: Today's group addressed the issue of overcoming obstacles.  Patients were asked to identify their biggest obstacle post d/c that stands in the way of their on-going success, and then problem solve as to how to manage this.  [place] was slow to warm up, but once started was very involved.  Initially talked about her daughter and how she has to try to be a peacemaker and not be real with her.  After others talked about struggles of substance abuse, she shared her obstacle of sobriety, and went on to talk about benefits of 12 step program.  Good insights.  Spent time encouraging others and giving positive feedback.  Stated she has accepted her time here because she is meeting such nice people, is learning that her problems aren't as great as they seem since she has heard others' stories, and believes that sometimes there is a greater plan that we do not understand.  Daryel Gerald B 09/10/2013   4:12 PM

## 2013-09-11 LAB — GLUCOSE, CAPILLARY
Glucose-Capillary: 201 mg/dL — ABNORMAL HIGH (ref 70–99)
Glucose-Capillary: 219 mg/dL — ABNORMAL HIGH (ref 70–99)

## 2013-09-11 NOTE — Progress Notes (Signed)
Adult Psychoeducational Group Note  Date:  09/11/2013 Time:  3:13 AM  Group Topic/Focus:  Wrap-Up Group:   The focus of this group is to help patients review their daily goal of treatment and discuss progress on daily workbooks.  Participation Level:  Active  Participation Quality:  Appropriate  Affect:  Appropriate  Cognitive:  Appropriate  Insight: Appropriate  Engagement in Group:  Engaged  Modes of Intervention:  Support  Additional Comments:  Patient attended and participated in group tonight. She reports having a good day. She advised that earlier in the day things were a little unsettling, however, it all work out later. She attended her groups, which she enjoyed. She had her meals and took her medications.  Lita Mains [facility] 09/11/2013, 3:13 AM

## 2013-09-11 NOTE — Progress Notes (Signed)
Patient ID: Joyce Richardson, female   DOB: Mar 24, 1946, 67 y.o.   MRN: [id number]  D: Patient denies SI/HI and A/V hallucinations. Patient reports no pain or discomfort at this time. Patient is animated when speaking to Clinical research associate and cooperative. Patient is taking all medications except for Vistaril. Patient has refused Vistaril twice today and states, "let's just wait 'til tonight."   A: Support and encouragement is given to patient.   R: Patient is receptive to Clinical research associate and is cooperative. Q15 minute checks are maintained for safety.

## 2013-09-11 NOTE — [facility] Suicide Risk Assessment (Signed)
[facility] INPATIENT:  Family/Significant Other Suicide Prevention Education  Suicide Prevention Education:  Education Completed; Evangeline Utley, partner, [phone] has been identified by the patient as the family member/significant other with whom the patient will be residing, and identified as the person(s) who will aid the patient in the event of a mental health crisis (suicidal ideations/suicide attempt).  With written consent from the patient, the family member/significant other has been provided the following suicide prevention education, prior to the and/or following the discharge of the patient.  The suicide prevention education provided includes the following:  Suicide risk factors  Suicide prevention and interventions  National Suicide Hotline telephone number  [facility] assessment telephone number  [facility] Emergency Assistance 911  [facility] and/or Residential Mobile Crisis Unit telephone number  Request made of family/significant other to:  Remove weapons (e.g., guns, rifles, knives), all items previously/currently identified as safety concern.    Remove drugs/medications (over-the-counter, prescriptions, illicit drugs), all items previously/currently identified as a safety concern.  The family member/significant other verbalizes understanding of the suicide prevention education information provided.  The family member/significant other agrees to remove the items of safety concern listed above.  Ron states that he has changed the combination on the safe, and both her meds and his meds are locked up.  Daryel Gerald B 09/11/2013, 10:14 AM

## 2013-09-11 NOTE — [facility] Group Notes (Signed)
[facility] LCSW Group Therapy  09/11/2013 , 1:10 PM   Type of Therapy:  Group Therapy  Participation Level:  Active  Participation Quality:  Attentive  Affect:  Appropriate  Cognitive:  Alert  Insight:  Improving  Engagement in Therapy:  Engaged  Modes of Intervention:  Discussion, Exploration and Socialization  Summary of Progress/Problems: Today's group focused on the term Diagnosis.  Participants were asked to define the term, and then pronounce whether it is a negative, positive or neutral term.  Joyce Richardson was a joy to have in group.  She was engaged, tried to draw others out and gave support to others who were struggling.  She not only stayed on topic, but brought up related questions that made the discussion more interesting.  She does not shy away from her diagnosis of addiction, but also used it as an opportunity to talk about the relapse and recovery process.  Also enjoyed talking about her experiences of being labeled and stigmatized because of her diagnosis.  Talked about it as "fear based."  Daryel Gerald B 09/11/2013 , 1:10 PM

## 2013-09-11 NOTE — Progress Notes (Signed)
Adult Psychoeducational Group Note  Date:  09/11/2013 Time:  8:00 pm  Group Topic/Focus:  Wrap-Up Group:   The focus of this group is to help patients review their daily goal of treatment and discuss progress on daily workbooks.  Participation Level:  Active  Participation Quality:  Appropriate  Affect:  Appropriate  Cognitive:  Appropriate  Insight: Good  Engagement in Group:  Engaged  Modes of Intervention:  Discussion, Education,   Additional Comments:  Pt stated she is in the hospital due to her depression. Pt stated that she became overwhelmed with her life at home. Pt stated that she is blessed to be at [facility] and that she is happy to be leaving soon. Pt stated that the treatment at the hospital has been helpful.   Taite Schoeppner 09/11/2013, 10:30 PM

## 2013-09-11 NOTE — Tx Team (Signed)
  Interdisciplinary Treatment Plan Update   Date Reviewed:  09/11/2013  Time Reviewed:  5:01 PM  Progress in Treatment:   Attending groups: Yes Participating in groups: Yes Taking medication as prescribed: Yes  Tolerating medication: Yes Family/Significant other contact made: Yes  Patient understands diagnosis: Yes  Discussing patient identified problems/goals with staff: Yes Medical problems stabilized or resolved: Yes Denies suicidal/homicidal ideation: Yes Patient has not harmed self or others: Yes  For review of initial/current patient goals, please see plan of care.  Estimated Length of Stay:  D/C tomorrow  Reason for Continuation of Hospitalization:   New Problems/Goals identified:  N/A  Discharge Plan or Barriers:   return home, follow up outpt  Additional Comments:  Attendees:  Signature: Thedore Mins, MD 09/11/2013 5:01 PM   Signature: Richelle Ito, LCSW 09/11/2013 5:01 PM  Signature: Fransisca Kaufmann, NP 09/11/2013 5:01 PM  Signature: Joslyn Devon, RN 09/11/2013 5:01 PM  Signature: Liborio Nixon, RN 09/11/2013 5:01 PM  Signature:  09/11/2013 5:01 PM  Signature:   09/11/2013 5:01 PM  Signature:    Signature:    Signature:    Signature:    Signature:    Signature:      Scribe for Treatment Team:   Richelle Ito, LCSW  09/11/2013 5:01 PM

## 2013-09-11 NOTE — Progress Notes (Signed)
Patient ID: Joyce Richardson, female   DOB: September 18, 1946, 66 y.o.   MRN: [id number] [facility] MD Progress Note  09/11/2013 2:50 PM Joyce Richardson  MRN:  [id number] Subjective:  Patient states "I'm feeling emotionally stable and am happy to be alive. There is a new lock on my husbands medicine safe. I plan on getting back into AA and maybe doing some volunteer work."  Objective:  Patient is attending groups on the unit. She reports being less upset about being in the hospital still. However, she greatly minimizes the two suicide attempts that have occurred recently. Patient feels that she should have been home days ago. However she has been actively attending groups and the notes indicate that she is showing great insight through her comments during.   Diagnosis:   DSM5:  Substance/Addictive Disorders:  Alcohol Related Disorder - Severe (303.90) and Opioid Disorder - Moderate (304.00) Depressive Disorders:  Major Depressive Disorder - Severe (296.23)  Axis I: Anxiety Disorder NOS, Major Depression, Recurrent severe and Substance Induced Mood Disorder Axis II: Deferred Axis III:  Past Medical History  Diagnosis Date  . Multiple falls   . Orthostatic hypotension 05/17/12  . HYPERTENSION     hx. of; no longer on med. due to orthostatic hypotension  . Chronic diastolic heart failure   . DIABETES MELLITUS, TYPE II 2001    IDDM  . Carpal tunnel syndrome of right wrist 01/2013  . 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 Diabetic neuropathy     feet  . Hyperlipidemia   . History of substance abuse     Etoh and narcotics  . Diverticulosis   . GERD   . SEIZURE DISORDER     no seizures in 3 yrs.  . Wears dentures     upper  . Sleep apnea 05/26/2012    sleep study:  AHI 7.5; RDI 10.7; CPAP not recommended  . 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 COPD (chronic obstructive pulmonary disease)     on home O2 at night, 2l/min.; denies SOB with daily activities   Axis IV: other  psychosocial or environmental problems, problems related to social environment and problems with primary support group Axis V: 51-6- Moderate Symptoms  ADL's:  Intact  Sleep: Good  Appetite:  Fair  Suicidal Ideation:  Plan:  none Intent:  none Means:  none Homicidal Ideation:  Denies  Psychiatric Specialty Exam: Review of Systems  Constitutional: Negative.   HENT: Negative.   Eyes: Negative.   Respiratory: Negative.   Cardiovascular: Negative.   Gastrointestinal: Negative.   Genitourinary: Negative.   Musculoskeletal: Negative.   Skin: Negative.   Neurological: Negative.   Endo/Heme/Allergies: Negative.   Psychiatric/Behavioral: Positive for depression and suicidal ideas. The patient is nervous/anxious.     Blood pressure 121/72, pulse 80, temperature 98.2 F (36.8 C), temperature source Oral, resp. rate 20, height 5' (1.524 m), weight 80.74 kg (178 lb), SpO2 98.00%.Body mass index is 34.76 kg/(m^2).  General Appearance: Casual  Eye Contact::  Fair  Speech:  Normal Rate  Volume:  Normal  Mood:  Anxious and Depressed  Affect:  Congruent  Thought Process:  Coherent  Orientation:  Full (Time, Place, and Person)  Thought Content:  WDL  Suicidal Thoughts:  Denies  Homicidal Thoughts:  No  Memory:  Immediate;   Fair Recent;   Fair Remote;   Fair  Judgement:  Poor  Insight:  Fair  Psychomotor Activity:  Normal  Concentration:  Fair  Recall:  Fair  Akathisia:  No  Handed:  Right  AIMS (if indicated):     Assets:  Resilience Social Support  Sleep:  Number of Hours: 3.25   Current Medications: Current Facility-Administered Medications  Medication Dose Route Frequency Provider Last Rate Last Dose  . alum & mag hydroxide-simeth (MAALOX/MYLANTA) 200-200-20 MG/5ML suspension 30 mL  30 mL Oral Q4H PRN Cleotis Nipper, MD      . atorvastatin (LIPITOR) tablet 10 mg  10 mg Oral Daily Cleotis Nipper, MD   10 mg at 09/11/13 0809  . estrogens (conjugated) (PREMARIN) tablet  0.625 mg  0.625 mg Oral Daily Cleotis Nipper, MD   0.625 mg at 09/11/13 0808  . hydrOXYzine (ATARAX/VISTARIL) tablet 50 mg  50 mg Oral Q6H WA Cleotis Nipper, MD   50 mg at 09/10/13 2306  . ibuprofen (ADVIL,MOTRIN) tablet 800 mg  800 mg Oral Q6H PRN Nanine Means, NP   800 mg at 09/10/13 1840  . insulin aspart (novoLOG) injection 0-15 Units  0-15 Units Subcutaneous TID WC Cleotis Nipper, MD   5 Units at 09/11/13 1210  . insulin glargine (LANTUS) injection 13 Units  13 Units Subcutaneous QHS Cleotis Nipper, MD   13 Units at 09/10/13 2108  . levETIRAcetam (KEPPRA XR) 24 hr tablet 500 mg  500 mg Oral BID Cleotis Nipper, MD   500 mg at 09/11/13 0805  . Liraglutide SOPN 1.8 mg  1.8 mg Subcutaneous QPC supper Mojeed Akintayo   1.8 mg at 09/09/13 1812  . magnesium hydroxide (MILK OF MAGNESIA) suspension 30 mL  30 mL Oral Daily PRN Cleotis Nipper, MD      . magnesium oxide (MAG-OX) tablet 200 mg  200 mg Oral Daily Cleotis Nipper, MD   200 mg at 09/11/13 0805  . nicotine (NICODERM CQ - dosed in mg/24 hours) patch 14 mg  14 mg Transdermal Q0600 Cleotis Nipper, MD   14 mg at 09/11/13 9147  . OLANZapine zydis (ZYPREXA) disintegrating tablet 10 mg  10 mg Oral Q8H PRN Mojeed Akintayo      . pantoprazole (PROTONIX) EC tablet 80 mg  80 mg Oral Daily Cleotis Nipper, MD   80 mg at 09/11/13 0806  . pregabalin (LYRICA) capsule 600 mg  600 mg Oral QHS Cleotis Nipper, MD   600 mg at 09/10/13 2109  . pyridostigmine (MESTINON) tablet 60 mg  60 mg Oral BID Cleotis Nipper, MD   60 mg at 09/11/13 0808  . QUEtiapine (SEROQUEL) tablet 300 mg  300 mg Oral QHS Mojeed Akintayo   300 mg at 09/10/13 2109  . venlafaxine XR (EFFEXOR-XR) 24 hr capsule 150 mg  150 mg Oral Q breakfast Mojeed Akintayo   150 mg at 09/11/13 8295    Lab Results:  Results for orders placed during the hospital encounter of 09/06/13 (from the past 48 hour(s))  GLUCOSE, CAPILLARY     Status: Abnormal   Collection Time    09/09/13  5:07 PM      Result Value Range    Glucose-Capillary 225 (*) 70 - 99 mg/dL  GLUCOSE, CAPILLARY     Status: Abnormal   Collection Time    09/09/13  9:08 PM      Result Value Range   Glucose-Capillary 120 (*) 70 - 99 mg/dL  GLUCOSE, CAPILLARY     Status: Abnormal   Collection Time    09/10/13  6:14 AM  Result Value Range   Glucose-Capillary 145 (*) 70 - 99 mg/dL   Comment 1 Notify RN    GLUCOSE, CAPILLARY     Status: Abnormal   Collection Time    09/10/13 12:02 PM      Result Value Range   Glucose-Capillary 164 (*) 70 - 99 mg/dL  GLUCOSE, CAPILLARY     Status: Abnormal   Collection Time    09/10/13  5:03 PM      Result Value Range   Glucose-Capillary 172 (*) 70 - 99 mg/dL   Comment 1 Documented in Chart     Comment 2 Notify RN    GLUCOSE, CAPILLARY     Status: Abnormal   Collection Time    09/10/13  8:58 PM      Result Value Range   Glucose-Capillary 164 (*) 70 - 99 mg/dL   Comment 1 Notify RN    GLUCOSE, CAPILLARY     Status: Abnormal   Collection Time    09/11/13  6:46 AM      Result Value Range   Glucose-Capillary 201 (*) 70 - 99 mg/dL  GLUCOSE, CAPILLARY     Status: Abnormal   Collection Time    09/11/13 12:00 PM      Result Value Range   Glucose-Capillary 219 (*) 70 - 99 mg/dL    Physical Findings: AIMS: Facial and Oral Movements Muscles of Facial Expression: None, normal Lips and Perioral Area: None, normal Jaw: None, normal Tongue: None, normal,Extremity Movements Upper (arms, wrists, hands, fingers): None, normal Lower (legs, knees, ankles, toes): None, normal, Trunk Movements Neck, shoulders, hips: None, normal, Overall Severity Severity of abnormal movements (highest score from questions above): None, normal Incapacitation due to abnormal movements: None, normal Patient's awareness of abnormal movements (rate only patient's report): No Awareness, Dental Status Current problems with teeth and/or dentures?: Yes (left dentures at home, upper and lower) Does patient usually wear  dentures?: Yes  CIWA:  CIWA-Ar Total: 3 COWS:  COWS Total Score: 4  Treatment Plan Summary: Daily contact with patient to assess and evaluate symptoms and progress in treatment Medication management  Plan:  Review of chart, vital signs, medications, and notes. 1-Individual and group therapy 2-Medication management for depression, substance abuse, and anxiety:  Medications reviewed with the patient and she stated she did not need have any untoward effects, no changes made 3-Coping skills for depression, anxiety, and substance abuse 4-Continue crisis stabilization and management 5-Address health issues--monitoring vital signs, stable 6-Treatment plan in progress to prevent relapse of depression, substance abuse, and anxiety 7. Continue Effexor XR to 150mg  po daily for depression. 8. Continue Seroquel 300mg  po Qhs for mood lability. 9. Anticipate d/c tomorrow.   Medical Decision Making Problem Points:  Established problem, stable/improving (1) and Review of psycho-social stressors (1) Data Points:  Review of medication regiment & side effects (2)  I certify that inpatient services furnished can reasonably be expected to improve the patient's condition.   Fransisca Kaufmann, NP-C 09/11/2013, 2:50 PM

## 2013-09-11 NOTE — Progress Notes (Signed)
D: Patient in the dayroom on approach.  Patient states she feels good.  Patient states she is happy because she is going home tomorrow.  Patient states she learned what [facility] was today. Patient husband came to visit to her today and patient states it was a good visit. Patient states she attended her groups today and states she was able to go outside.  Patient denies SI/HI and denies AVH.   A: Staff to monitor Q 15 mins for safety.  Encouragement and support offered.  Scheduled medications administered per orders.  Ibuprofen administered prn for pain to ribs. R: Patient remains safe on the unit.  Patient attended group tonight.  Patient visible on the unit and interacting with peers.  Patient states the ibuprofen helped her pain.  Patient taking administered medications.  Patient 1:1 started tonight at 2145.

## 2013-09-12 DIAGNOSIS — F1110 Opioid abuse, uncomplicated: Secondary | ICD-10-CM

## 2013-09-12 LAB — GLUCOSE, CAPILLARY: Glucose-Capillary: 156 mg/dL — ABNORMAL HIGH (ref 70–99)

## 2013-09-12 MED ORDER — CENTRUM SILVER PO TABS: 1.0000 | ORAL_TABLET | Freq: Every day | ORAL | Status: AC

## 2013-09-12 MED ORDER — METFORMIN HCL ER 500 MG PO TB24: 1500.0000 mg | ORAL_TABLET | Freq: Every day | ORAL | Status: AC

## 2013-09-12 MED ORDER — HYDROCHLOROTHIAZIDE 12.5 MG PO TABS: 12.5000 mg | ORAL_TABLET | Freq: Every day | ORAL | Status: DC

## 2013-09-12 MED ORDER — OMEPRAZOLE 20 MG PO CPDR: 20.0000 mg | DELAYED_RELEASE_CAPSULE | Freq: Every day | ORAL | Status: AC

## 2013-09-12 MED ORDER — LEVETIRACETAM ER 500 MG PO TB24: 500.0000 mg | ORAL_TABLET | Freq: Two times a day (BID) | ORAL | Status: DC

## 2013-09-12 MED ORDER — LIRAGLUTIDE 18 MG/3ML ~~LOC~~ SOPN: 1.8000 mg | PEN_INJECTOR | Freq: Every day | SUBCUTANEOUS | Status: AC

## 2013-09-12 MED ORDER — PREGABALIN 300 MG PO CAPS: 600.0000 mg | ORAL_CAPSULE | Freq: Every day | ORAL | Status: DC

## 2013-09-12 MED ORDER — CALCIUM-VITAMIN D 250-125 MG-UNIT PO TABS: 1.0000 | ORAL_TABLET | Freq: Every day | ORAL | Status: AC

## 2013-09-12 MED ORDER — VENLAFAXINE HCL ER 150 MG PO CP24: 150.0000 mg | ORAL_CAPSULE | Freq: Every day | ORAL | Status: AC

## 2013-09-12 MED ORDER — ESTROGENS CONJUGATED 0.625 MG PO TABS: 0.6250 mg | ORAL_TABLET | Freq: Every day | ORAL | Status: AC

## 2013-09-12 MED ORDER — MAGNESIUM 250 MG PO TABS: 250.0000 mg | ORAL_TABLET | Freq: Every day | ORAL | Status: AC

## 2013-09-12 MED ORDER — INSULIN GLARGINE 100 UNIT/ML ~~LOC~~ SOLN: 26.0000 [IU] | Freq: Every day | SUBCUTANEOUS | Status: AC

## 2013-09-12 MED ORDER — ATORVASTATIN CALCIUM 10 MG PO TABS: 10.0000 mg | ORAL_TABLET | Freq: Every day | ORAL | Status: AC

## 2013-09-12 MED ORDER — NICOTINE 14 MG/24HR TD PT24: 1.0000 | MEDICATED_PATCH | Freq: Every day | TRANSDERMAL | Status: AC

## 2013-09-12 MED ORDER — QUETIAPINE FUMARATE 300 MG PO TABS: 300.0000 mg | ORAL_TABLET | Freq: Every day | ORAL | Status: AC

## 2013-09-12 MED ORDER — IPRATROPIUM BROMIDE 0.02 % IN SOLN: 500.0000 ug | Freq: Four times a day (QID) | RESPIRATORY_TRACT | Status: AC

## 2013-09-12 NOTE — Progress Notes (Signed)
[facility] Adult Case Management Discharge Plan :  Will you be returning to the same living situation after discharge: Yes,  home At discharge, do you have transportation home?:Yes,  family Do you have the ability to pay for your medications:Yes,  insurance  Release of information consent forms completed and in the chart;  Patient's signature needed at discharge.  Patient to Follow up at: Follow-up Information   Follow up with [facility] On 09/13/2013. (with Charmian Muff on Thursday at 10:00AM)    Contact information:   [address]  [facility]  [[phone]] [phone]      Patient denies SI/HI:   Yes,  yes    Safety Planning and Suicide Prevention discussed:  Yes,  ywes  Ida Rogue 09/12/2013, 11:22 AM

## 2013-09-12 NOTE — Progress Notes (Signed)
Recreation Therapy Notes  Date: 10.22.2014 Time: 9:30am Location: [address] Dayroom  Group Topic: Self-Esteem  Goal Area(s) Addresses:  Patient will effectively give examples of way to increase self-esteem. Patient will verbalize benefit of increased self-esteem.   Behavioral Response: Engaged, Attentive  Intervention: Air traffic controller  Activity: Patient were given the '10 Steps to Self-Esteem' worksheet and asked to identify why each step is important, as well as given examples of how they can invest in each step. 10 Steps include: 1- Know yourself, 2- Understand what makes you feel great. 3- Recognize things that get your down. 4- Set goals to achieve what you want. 5- Develop trusting friendships that make you feel good. 6- Don't be afraid to ask for help. 7- Stand up for your beliefs and values 8- Help someone else 9- Take responsibility for your own actions 10- Take good care of yourself.   Education:  Discharge Planning, Self investment  Education Outcome: Acknowledges understanding  Clinical Observations/Feedback: Patient actively engaged in group activity, giving examples for nearly every step, as well as identifying why theses steps are important. Patient shared that she intends on getting a manicure and a pedicure as soon as she is d/c, patient related this to step 2. Stating that she knows she enjoys getting these and they make her feel good about herself. Patient additionally stated that she has two close friends at home that she does most things with and she thinks of these friends as family, patient related this to step 5 and step 8. Patient was able to relate her friends to step 8 because they all share similar values and they lean on each other. Patient additionally spoke about helping others near her home. 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 Patient  successfully related this to step 8. Patient succesfully identified ways to increase self-esteem, in addition to how self-esteem affects ability to maintain health. Patient offered support and encouragement to peers as needed.   Joyce Richardson, LRT/CTRS  Dawana Asper L 09/12/2013 12:20 PM

## 2013-09-12 NOTE — [facility] Suicide Risk Assessment (Signed)
Suicide Risk Assessment  Discharge Assessment     Demographic Factors:  Age 69 or older, Caucasian, Low socioeconomic status, Unemployed and female  Mental Status Per Nursing Assessment::   On Admission:     Current Mental Status by Physician: patient denies suicidal ideation, intent or plan  Loss Factors: Financial problems/change in socioeconomic status  Historical Factors: Family history of mental illness or substance abuse and Impulsivity  Risk Reduction Factors:   Sense of responsibility to family, Living with another person, especially a relative and Positive social support  Continued Clinical Symptoms:  Alcohol/Substance Abuse/Dependencies  Cognitive Features That Contribute To Risk:  Polarized thinking    Suicide Risk:  Minimal: No identifiable suicidal ideation.  Patients presenting with no risk factors but with morbid ruminations; may be classified as minimal risk based on the severity of the depressive symptoms  Discharge Diagnoses:   AXIS I:  Major depressive disorder, recurrent episode, severe, without mention of psychotic behavior              Opiates abuse  AXIS II:  Deferred AXIS III:   Past Medical History  Diagnosis Date  . Multiple falls   . Orthostatic hypotension 05/17/12  . HYPERTENSION     hx. of; no longer on med. due to orthostatic hypotension  . ANXIETY   . Chronic diastolic heart failure   . DIABETES MELLITUS, TYPE II 2001    IDDM  . Carpal tunnel syndrome of right wrist 01/2013  . 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 Diabetic neuropathy     feet  . Hyperlipidemia   . History of substance abuse     Etoh and narcotics  . Diverticulosis   . DEPRESSION   . GERD   . SEIZURE DISORDER     no seizures in 3 yrs.  . Wears dentures     upper  . Sleep apnea 05/26/2012    sleep study:  AHI 7.5; RDI 10.7; CPAP not recommended  . 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 COPD (chronic obstructive pulmonary disease)     on  home O2 at night, 2l/min.; denies SOB with daily activities   AXIS IV:  economic problems, problems related to legal system/crime and problems related to social environment AXIS V:  61-70 mild symptoms  Plan Of Care/Follow-up recommendations:  Activity:  as tolerated Diet:  healthy Tests:  routine Other:  patient to keep her after care appointment  Is patient on multiple antipsychotic therapies at discharge:  No   Has Patient had three or more failed trials of antipsychotic monotherapy by history:  No  Recommended Plan for Multiple Antipsychotic Therapies: NA  Thedore Mins, MD 09/12/2013, 10:08 AM

## 2013-09-12 NOTE — Progress Notes (Signed)
Patient ID: Joyce Richardson, female   DOB: 1946-05-18, 68 y.o.   MRN: [id number]  D: Patient denies SI/HI and A/V hallucinations. Patient states that she has 8/10 pain this morning and requested PRN Ibuprofen for this pain. Patient has no other discomfort at this time. Pt is animated when speaking with Clinical research associate. Pt has anxious mood. Patient reports sleeping very well last night. Pt rated her depression and hopelessness both at 1/10 (0-10 scale) for the day. Patient states, "Today is an important day, I get to go home."   A: Writer gave PRN Ibuprofen for pt's pain. Support and encouragement given to patient by Clinical research associate.   R: Pt. is pleasant and cooperative with staff and the other patients. Q15 minute checks are maintained for safety.

## 2013-09-12 NOTE — Progress Notes (Signed)
Patient ID: Joyce Richardson, female   DOB: 14-Mar-1946, 67 y.o.   MRN: [id number]  Patient denies SI/HI and A/V hallucinations. Belongings were returned to patient and patient was received by her husband after discharge. Patient obtained prescriptions and sample medications. Discharge instructions given and explained to patient. Patient verbalized understanding of both medications and discharge instructions.

## 2013-09-12 NOTE — Progress Notes (Signed)
Seen and agreed. Zuriyah Shatz, MD 

## 2013-09-12 NOTE — Discharge Summary (Signed)
Physician Discharge Summary Note  Patient:  Joyce Richardson is an 67 y.o., female MRN:  [id number] DOB:  04-11-1946 Patient phone:  [phone] (home)  Patient address:   [address] [place] [address]   Date of Admission:  09/06/2013 Date of Discharge: 09/12/13  Discharge Diagnoses: Principal Problem:   Major depressive disorder, recurrent episode, severe, without mention of psychotic behavior  Axis Diagnosis:  AXIS I: Major depressive disorder, recurrent episode, severe, without mention of psychotic behavior  Opiates abuse  AXIS II: Deferred  AXIS III:  Past Medical History   Diagnosis  Date   .  Multiple falls    .  Orthostatic hypotension  05/17/12   .  HYPERTENSION      hx. of; no longer on med. due to orthostatic hypotension   .  ANXIETY    .  Chronic diastolic heart failure    .  DIABETES MELLITUS, TYPE II  2001     IDDM   .  Carpal tunnel syndrome of right wrist  01/2013   .  Ulnocarpal abutment syndrome  01/2013     right   .  Diabetic neuropathy      feet   .  Hyperlipidemia    .  History of substance abuse      Etoh and narcotics   .  Diverticulosis    .  DEPRESSION    .  GERD    .  SEIZURE DISORDER      no seizures in 3 yrs.   .  Wears dentures      upper   .  Sleep apnea  05/26/2012     sleep study: AHI 7.5; RDI 10.7; CPAP not recommended   .  Parasomnia in conditions classified elsewhere    .  COPD (chronic obstructive pulmonary disease)      on home O2 at night, 2l/min.; denies SOB with daily activities    AXIS IV: economic problems, problems related to legal system/crime and problems related to social environment  AXIS V: 61-70 mild symptoms   Level of Care:  OP  Hospital Course:   This is a 67 year old female who presented to [facility] via EMS personnel after her husband found her unresponsive. The patient has a history of overdosing on her prescription benzos. She required narcan on arrival to the ED and initially was very lethargic and drowsy.  Joyce Richardson denied to the ED staff that she had abused any substances and also denied that she had attempted suicide. The patient appeared from the notes to be greatly minimizing her symptoms. Today upon assessment patient states "I took my husband's fentanyl. I just wanted to get some sleep because I've been under tremendous stress. I was trying to sell my house and it was more than I could handle. Then my husband went into he hospital with double pneumonia. He normally gives me my psych medications and keeps them locked up. I wasn't getting them." However patient reports knowing the code to the safe and that she has been stealing her husband's fentanyl patches to get high off of stating "I used to abuse pain pills. The temptation of having that in the house was overwhelming. I was in rehab for that four years ago. It gives you such a quick high. I am ashamed that I was misusing his medications because he has gotten sicker this year. Over the last six months my husband has had an increase in his MS symptoms." Joyce Richardson becomes tearful when  speaking about her past actions but continues to deny it was a suicide attempt but states "I know I'm lucky that I did not die." Patient reports that her husband is working to get the code to the safe changed.   While a patient in this hospital, Joyce Richardson was enrolled in group counseling and activities as well as received the following medication Current facility-administered medications:alum & mag hydroxide-simeth (MAALOX/MYLANTA) 200-200-20 MG/5ML suspension 30 mL, 30 mL, Oral, Q4H PRN, Cleotis Nipper, MD;  atorvastatin (LIPITOR) tablet 10 mg, 10 mg, Oral, Daily, Cleotis Nipper, MD, 10 mg at 09/12/13 0801;  estrogens (conjugated) (PREMARIN) tablet 0.625 mg, 0.625 mg, Oral, Daily, Cleotis Nipper, MD, 0.625 mg at 09/12/13 0800 hydrOXYzine (ATARAX/VISTARIL) tablet 50 mg, 50 mg, Oral, Q6H WA, Cleotis Nipper, MD, 50 mg at 09/11/13 1943;  ibuprofen (ADVIL,MOTRIN) tablet 800 mg, 800 mg,  Oral, Q6H PRN, Nanine Means, NP, 800 mg at 09/12/13 0804;  insulin aspart (novoLOG) injection 0-15 Units, 0-15 Units, Subcutaneous, TID WC, Cleotis Nipper, MD, 3 Units at 09/12/13 0656 insulin glargine (LANTUS) injection 13 Units, 13 Units, Subcutaneous, QHS, Cleotis Nipper, MD, 13 Units at 09/11/13 2102;  levETIRAcetam (KEPPRA XR) 24 hr tablet 500 mg, 500 mg, Oral, BID, Cleotis Nipper, MD, 500 mg at 09/12/13 0801;  Liraglutide SOPN 1.8 mg, 1.8 mg, Subcutaneous, QPC supper, Mojeed Akintayo, 1.8 mg at 09/09/13 1812;  magnesium hydroxide (MILK OF MAGNESIA) suspension 30 mL, 30 mL, Oral, Daily PRN, Cleotis Nipper, MD magnesium oxide (MAG-OX) tablet 200 mg, 200 mg, Oral, Daily, Cleotis Nipper, MD, 200 mg at 09/12/13 0759;  nicotine (NICODERM CQ - dosed in mg/24 hours) patch 14 mg, 14 mg, Transdermal, Q0600, Cleotis Nipper, MD, 14 mg at 09/12/13 0658;  pantoprazole (PROTONIX) EC tablet 80 mg, 80 mg, Oral, Daily, Cleotis Nipper, MD, 80 mg at 09/12/13 0800;  pregabalin (LYRICA) capsule 600 mg, 600 mg, Oral, QHS, Cleotis Nipper, MD, 600 mg at 09/11/13 2103 pyridostigmine (MESTINON) tablet 60 mg, 60 mg, Oral, BID, Cleotis Nipper, MD, 60 mg at 09/12/13 0800;  QUEtiapine (SEROQUEL) tablet 300 mg, 300 mg, Oral, QHS, Mojeed Akintayo, 300 mg at 09/11/13 2103;  venlafaxine XR (EFFEXOR-XR) 24 hr capsule 150 mg, 150 mg, Oral, Q breakfast, Mojeed Akintayo, 150 mg at 09/12/13 0800 Patient's medications were managed by the MD. Her Effexor and Seroquel dosages were both increased. Patient minimized her two recent suicide attempts at the start of her admission. As her admission progressed patient was able to verbalize that her depression was worse than she realized and that she needed help. Patient participated well during groups and showed no behavioral problems during her admission. The patient began to deny SI and was more open to making changes in her life after d/c. She was honest with her husband about her abuse of his fentanyl patches  and reported that he had changed the lock on the safe. Patient attended treatment team meeting this am and met with treatment team members. Pt symptoms, treatment plan and response to treatment discussed. Joyce Richardson endorsed that their symptoms have improved. Pt also stated that they are stable for discharge.  In other to control Principal Problem:   Major depressive disorder, recurrent episode, severe, without mention of psychotic behavior , they will continue psychiatric care on outpatient basis. They will follow-up at  Follow-up Information   Follow up with [facility] On 09/13/2013. (with Charmian Muff on Thursday at 10:00AM)  Contact information:   [address]  [facility]  [[phone]] [phone]    .  In addition they were instructed to take all your medications as prescribed by your mental healthcare provider, to report any adverse effects and or reactions from your medicines to your outpatient provider promptly, patient is instructed and cautioned to not engage in alcohol and or illegal drug use while on prescription medicines, in the event of worsening symptoms, patient is instructed to call the crisis hotline, 911 and or go to the nearest ED for appropriate evaluation and treatment of symptoms.   Upon discharge, patient adamantly denies suicidal, homicidal ideations, auditory, visual hallucinations and or delusional thinking. They left [facility] with all personal belongings in no apparent distress.  Consults:  See electronic record for details  Significant Diagnostic Studies:  See electronic record for details  Discharge Vitals:   Blood pressure 129/71, pulse 74, temperature 97.3 F (36.3 C), temperature source Oral, resp. rate 16, height 5' (1.524 m), weight 80.74 kg (178 lb), SpO2 98.00%..  Mental Status Exam: See Mental Status Examination and Suicide Risk Assessment completed by Attending Physician prior to discharge.  Discharge destination:  Home  Is patient on multiple  antipsychotic therapies at discharge:  No  Has Patient had three or more failed trials of antipsychotic monotherapy by history: N/A Recommended Plan for Multiple Antipsychotic Therapies: N/A     Discharge Orders   Future Orders Complete By Expires   Discharge instructions  As directed    Comments:     Please follow up with your Primary Care Provider for follow up of your medical problems such as Diabetes.       Medication List    STOP taking these medications       clonazePAM 0.25 MG disintegrating tablet  Commonly known as:  KLONOPIN      TAKE these medications     Indication   acetaminophen 325 MG tablet  Commonly known as:  TYLENOL  Take 650 mg by mouth every 6 (six) hours as needed for pain.      albuterol (2.5 MG/3ML) 0.083% nebulizer solution  Commonly known as:  PROVENTIL  Take 2.5 mg by nebulization every 6 (six) hours as needed for wheezing.      atorvastatin 10 MG tablet  Commonly known as:  LIPITOR  Take 1 tablet (10 mg total) by mouth daily.   Indication:  Disease of the Heart and Blood Vessels     calcium-vitamin D 250-125 MG-UNIT per tablet  Commonly known as:  OSCAL  Take 1 tablet by mouth daily.   Indication:  Low Amount of Calcium in the Blood     CENTRUM SILVER tablet  Take 1 tablet by mouth daily.   Indication:  Vitamin Deficiency     estrogens (conjugated) 0.625 MG tablet  Commonly known as:  PREMARIN  Take 1 tablet (0.625 mg total) by mouth daily.   Indication:  Deficiency of the Hormone Estrogen     hydrochlorothiazide 12.5 MG tablet  Commonly known as:  HYDRODIURIL  Take 1 tablet (12.5 mg total) by mouth daily.   Indication:  High blood pressure     insulin glargine 100 UNIT/ML injection  Commonly known as:  LANTUS  Inject 0.26 mLs (26 Units total) into the skin at bedtime.   Indication:  Type 2 Diabetes     ipratropium 0.02 % nebulizer solution  Commonly known as:  ATROVENT  Take 2.5 mLs (500 mcg total) by nebulization 4 (four)  times daily.   Indication:  Dilation of Lung Air Passages     levETIRAcetam 500 MG 24 hr tablet  Commonly known as:  KEPPRA XR  Take 1 tablet (500 mg total) by mouth 2 (two) times daily.   Indication:  Seizure Disorder     Liraglutide 18 MG/3ML Sopn  Inject 1.8 mg into the skin daily.   Indication:  Type 2 Diabetes     Magnesium 250 MG Tabs  Take 1 tablet (250 mg total) by mouth daily with breakfast.   Indication:  Low Amount of Magnesium in the Blood     metFORMIN 500 MG 24 hr tablet  Commonly known as:  GLUCOPHAGE-XR  Take 3 tablets (1,500 mg total) by mouth daily at 12 noon.   Indication:  Type 2 Diabetes     nicotine 14 mg/24hr patch  Commonly known as:  NICODERM CQ - dosed in mg/24 hours  Place 1 patch onto the skin daily.   Indication:  Nicotine Addiction     omeprazole 20 MG capsule  Commonly known as:  PRILOSEC  Take 1 capsule (20 mg total) by mouth daily.   Indication:  Gastroesophageal Reflux Disease with Current Symptoms     pregabalin 300 MG capsule  Commonly known as:  LYRICA  Take 2 capsules (600 mg total) by mouth at bedtime.   Indication:  Partial Onset Seizures     QUEtiapine 300 MG tablet  Commonly known as:  SEROQUEL  Take 1 tablet (300 mg total) by mouth at bedtime.   Indication:  Trouble Sleeping, Mood stability     venlafaxine XR 150 MG 24 hr capsule  Commonly known as:  EFFEXOR-XR  Take 1 capsule (150 mg total) by mouth daily with breakfast.   Indication:  Generalized Anxiety Disorder, Major Depressive Disorder       Follow-up Information   Follow up with [facility] On 09/13/2013. (with Charmian Muff on Thursday at 10:00AM)    Contact information:   [address]  [facility]  [336] [phone]     Follow-up recommendations:   Activities: Resume typical activities Diet: Resume typical diet Tests: none Other: Follow up with outpatient provider and report any side effects to out patient prescriber.  Comments:  Take all your  medications as prescribed by your mental healthcare provider. Report any adverse effects and or reactions from your medicines to your outpatient provider promptly. Patient is instructed and cautioned to not engage in alcohol and or illegal drug use while on prescription medicines. In the event of worsening symptoms, patient is instructed to call the crisis hotline, 911 and or go to the nearest ED for appropriate evaluation and treatment of symptoms. Follow-up with your primary care provider for your other medical issues, concerns and or health care needs.  SignedFransisca Kaufmann NP-C 09/12/2013 3:36 PM

## 2013-09-12 NOTE — Progress Notes (Signed)
Adult Psychoeducational Group Note  Date:  09/12/2013 Time:  11:00AM Group Topic/Focus:  Personal Choices and Values:   The focus of this group is to help patients assess and explore the importance of values in their lives, how their values affect their decisions, how they express their values and what opposes their expression.  Participation Level:  Active  Participation Quality:  Appropriate and Attentive  Affect:  Appropriate  Cognitive:  Alert and Appropriate  Insight: Appropriate  Engagement in Group:  Engaged  Modes of Intervention:  Discussion  Additional Comments:  Pt. Was appropriate and attentive during today's group discussion. Pt was able to talk about values and crisis plan.   Bing Plume D 09/12/2013, 12:12 PM

## 2013-09-13 NOTE — Discharge Summary (Signed)
Seen and agreed. Amillya Chavira, MD 

## 2013-09-17 NOTE — Progress Notes (Signed)
Patient Discharge Instructions:  Next Level Care Provider Has Access to the EMR, 09/17/13 Records provided to [facility] via [facility]/Epic access.  Jerelene Redden, 09/17/2013, 2:18 PM

## 2013-10-01 ENCOUNTER — Other Ambulatory Visit: Payer: Self-pay | Admitting: Neurology

## 2013-10-04 ENCOUNTER — Other Ambulatory Visit: Payer: Self-pay | Admitting: Neurology

## 2013-10-04 NOTE — Telephone Encounter (Signed)
Pt's prescription was faxed over to [facility] at [phone]. °

## 2013-10-05 ENCOUNTER — Telehealth: Payer: Self-pay | Admitting: *Deleted

## 2013-10-05 DIAGNOSIS — G253 Myoclonus: Secondary | ICD-10-CM

## 2013-10-08 ENCOUNTER — Other Ambulatory Visit: Payer: Self-pay | Admitting: Neurology

## 2013-10-08 MED ORDER — CLONAZEPAM 0.5 MG PO TABS: 0.5000 mg | ORAL_TABLET | Freq: Every day | ORAL | Status: DC

## 2013-10-08 NOTE — Telephone Encounter (Signed)
Please allow for 0.5 mg tab total strength  KLONOPIN as night time dose. No narcotics will be prescribed. Dr Frances Furbish.

## 2013-10-08 NOTE — Telephone Encounter (Signed)
Spoke to husband and relayed 0.5mg  Klonopin at bedtime has been prescribed by Dr. Vickey Huger.

## 2013-10-08 NOTE — Telephone Encounter (Signed)
Pt's prescription was faxed over to [facility] at [phone]. °

## 2013-10-11 ENCOUNTER — Telehealth: Payer: Self-pay | Admitting: Cardiology

## 2013-10-11 NOTE — Telephone Encounter (Signed)
New Problem  Pt was recently seen at [facility] long and was prescribed Hydrochlorothiacide for HIgh Bp/// No refills// Are refills needed to continue taking this medication and if so can you renew the script// please assist

## 2013-10-11 NOTE — Telephone Encounter (Signed)
Spoke with patient's husband. HCTZ was prescribed at discharge for patient when she was an inpatient in [facility] in October 2014. This was a new medication for her. She has not had any follow up with any provider since then. I have scheduled an appt for her to see Azucena Kuba 10/22/13 for follow up on whether she should continue this.

## 2013-10-22 ENCOUNTER — Encounter: Payer: Self-pay | Admitting: Physician Assistant

## 2013-10-22 ENCOUNTER — Ambulatory Visit (INDEPENDENT_AMBULATORY_CARE_PROVIDER_SITE_OTHER): Payer: Medicare Other | Admitting: Physician Assistant

## 2013-10-22 VITALS — BP 122/78 | HR 78 | Ht 60.5 in | Wt 196.4 lb

## 2013-10-22 DIAGNOSIS — F17200 Nicotine dependence, unspecified, uncomplicated: Secondary | ICD-10-CM

## 2013-10-22 DIAGNOSIS — J4489 Other specified chronic obstructive pulmonary disease: Secondary | ICD-10-CM

## 2013-10-22 DIAGNOSIS — S82892G Other fracture of left lower leg, subsequent encounter for closed fracture with delayed healing: Secondary | ICD-10-CM

## 2013-10-22 DIAGNOSIS — I951 Orthostatic hypotension: Secondary | ICD-10-CM

## 2013-10-22 DIAGNOSIS — I5032 Chronic diastolic (congestive) heart failure: Secondary | ICD-10-CM

## 2013-10-22 DIAGNOSIS — J449 Chronic obstructive pulmonary disease, unspecified: Secondary | ICD-10-CM

## 2013-10-22 DIAGNOSIS — I10 Essential (primary) hypertension: Secondary | ICD-10-CM

## 2013-10-22 DIAGNOSIS — IMO0001 Reserved for inherently not codable concepts without codable children: Secondary | ICD-10-CM

## 2013-10-22 NOTE — Progress Notes (Signed)
[address] [place], [place]  [address] Phone: [phone] Fax:  [phone]  Date:  10/22/2013   ID:  Makaley, Storts [birth year], MRN [id number]  PCP:  Astrid Divine, MD  Cardiologist:  Dr. Marca Ancona     History of Present Illness: Joyce Richardson is a 67 y.o. female with a hx of HTN, COPD, diastolic CHF and orthostatic hypotension. She has had multiple falls and has had broken bones. Dr. Shirlee Latch has stopped her BP medications in the past. She continued to be orthostatic, so she was placed on a low dose of midodrine. This caused her supine BP to rise significantly, so she stopped it.  She was then started on a low dose of pyridostigmine instead. This medication seemed to work well.  Last seen by Dr. Marca Ancona in 10/2012.  She was recently admitted to behavioral health in October for an overdose with narcotic pain medications.  Echo (08/2013):  Mild LVH, EF 55-60%, Gr 1 DD, PASP 37.  She was d/c on HCTZ and patient follows up today to see if she needs to remain on this medication.  She tells that she fractured her left ankle. I reviewed her chart and she had an avulsion fracture of the left lateral malleolus. She apparently had difficulty with higher blood pressures in the hospital. She tells me that she was told that HCTZ would help with controlling her blood pressure as well as the swelling of her left ankle. She denies chest pain. She has chronic dyspnea with exertion. This is unchanged. She denies syncope. She denies orthopnea, PND or edema. She has minimal orthostatic intolerance. This is overall improved since she was placed on pyridostigmine.     Recent Labs: 09/01/2013: Pro B Natriuretic peptide (BNP) 378.5*  09/06/2013: Hemoglobin 10.5*; TSH 1.246  09/07/2013: ALT 32; Creatinine 0.64; Potassium 3.3*   Wt Readings from Last 3 Encounters:  09/06/13 178 lb (80.74 kg)  09/05/13 180 lb 12.4 oz (82 kg)  09/01/13 180 lb 3.2 oz (81.738 kg)    Past  Medical History:  1. HYPERTENSION (ICD-401.9)  2. HYPERLIPIDEMIA (ICD-272.4)  3. ANXIETY (ICD-300.00)  4. 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 Hx of NARCOTIC ABUSE (ICD-305.90): [facility] 2010.  7. DIVERTICULOSIS-COLON (ICD-562.10)  8. IBS (ICD-564.1)  9. COPD (ICD-496): PFTs (3/11) FVC 80%, FEV1 74%, ratio 66%, TLC 90%, DLCO 54%. Mild obstruction, some response to bronchodilator.  10. TOBACCO USE (ICD-305.1)  11. ABUSE, ALCOHOL, IN REMISSION (ICD-305.03)  12. SEIZURE DISORDER (ICD-780.39)  13. GERD (ICD-530.81)  14. DIABETES MELLITUS, TYPE II (ICD-250.00)  15. DEPRESSION (ICD-311)per dr. Nolen Mu  16. ALLERGIC RHINITIS (ICD-477.9)  17. Hyponatremia with tegretol  18. Diastolic CHF: Echo (3/11) with EF 55-60%, moderate (grade II) diastolic dysfunction, mild LAE. Echo (7/13) with EF 55-60%, mild MR, mild LVH.  19. Lexiscan myoview (3/11): likely normal with anterior attenuation. EF 68%. Low risk study.  20. Orthostatic hypotension with supine hypertension.  21. Nocturnal hypoxemia: Sleep study in 8/13 showed mild OSA but significant nocturnal hypoxemia. There was also a question of atrial flutter. 3 week monitor in 8/13 showed no atrial fibrillation or flutter.    Past Medical History  Diagnosis Date  . Multiple falls   . Orthostatic hypotension 05/17/12  . HYPERTENSION     hx. of; no longer on med. due to orthostatic hypotension  . ANXIETY   . Chronic diastolic heart failure   . DIABETES MELLITUS, TYPE II 2001    IDDM  .  Carpal tunnel syndrome of right wrist 01/2013  . 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 Diabetic neuropathy     feet  . Hyperlipidemia   . History of substance abuse     Etoh and narcotics  . Diverticulosis   . DEPRESSION   . GERD   . SEIZURE DISORDER     no seizures in 3 yrs.  . Wears dentures     upper  . Sleep apnea 05/26/2012    sleep study:  AHI 7.5; RDI 10.7; CPAP not recommended  . Parasomnia in conditions classified  elsewhere   . COPD (chronic obstructive pulmonary disease)     on home O2 at night, 2l/min.; denies SOB with daily activities    Current Outpatient Prescriptions  Medication Sig Dispense Refill  . acetaminophen (TYLENOL) 325 MG tablet Take 650 mg by mouth every 6 (six) hours as needed for pain.      Marland Kitchen albuterol (PROVENTIL) (2.5 MG/3ML) 0.083% nebulizer solution Take 2.5 mg by nebulization every 6 (six) hours as needed for wheezing.      Marland Kitchen atorvastatin (LIPITOR) 10 MG tablet Take 1 tablet (10 mg total) by mouth daily.      . calcium-vitamin D (OSCAL) 250-125 MG-UNIT per tablet Take 1 tablet by mouth daily.      . clonazePAM (KLONOPIN) 0.5 MG tablet Take 1 tablet (0.5 mg total) by mouth at bedtime.  30 tablet  0  . estrogens, conjugated, (PREMARIN) 0.625 MG tablet Take 1 tablet (0.625 mg total) by mouth daily.      . hydrochlorothiazide (HYDRODIURIL) 12.5 MG tablet Take 1 tablet (12.5 mg total) by mouth daily.  30 tablet  0  . insulin glargine (LANTUS) 100 UNIT/ML injection Inject 0.26 mLs (26 Units total) into the skin at bedtime.  10 mL  12  . ipratropium (ATROVENT) 0.02 % nebulizer solution Take 2.5 mLs (500 mcg total) by nebulization 4 (four) times daily.  75 mL  0  . levETIRAcetam (KEPPRA XR) 500 MG 24 hr tablet Take 1 tablet (500 mg total) by mouth 2 (two) times daily.      . Liraglutide 18 MG/3ML SOPN Inject 1.8 mg into the skin daily.      . Magnesium 250 MG TABS Take 1 tablet (250 mg total) by mouth daily with breakfast.    0  . metFORMIN (GLUCOPHAGE-XR) 500 MG 24 hr tablet Take 3 tablets (1,500 mg total) by mouth daily at 12 noon.      . Multiple Vitamins-Minerals (CENTRUM SILVER) tablet Take 1 tablet by mouth daily.      . nicotine (NICODERM CQ - DOSED IN MG/24 HOURS) 14 mg/24hr patch Place 1 patch onto the skin daily.  28 patch  0  . omeprazole (PRILOSEC) 20 MG capsule Take 1 capsule (20 mg total) by mouth daily.      . pregabalin (LYRICA) 300 MG capsule Take 2 capsules (600 mg  total) by mouth at bedtime.  180 capsule  1  . QUEtiapine (SEROQUEL) 300 MG tablet Take 1 tablet (300 mg total) by mouth at bedtime.  30 tablet  0  . venlafaxine XR (EFFEXOR-XR) 150 MG 24 hr capsule Take 1 capsule (150 mg total) by mouth daily with breakfast.  30 capsule  0   No current facility-administered medications for this visit.    Allergies:   Penicillins and Tegretol   Social History:  The patient  reports that she has been smoking Cigarettes.  She has a 30 pack-year  smoking history. She has never used smokeless tobacco. She reports that she does not drink alcohol or use illicit drugs.   Family History:  The patient's family history includes Coronary artery disease in her father.   ROS:  Please see the history of present illness.   She has a chronic cough.   All other systems reviewed and negative.   PHYSICAL EXAM: VS:  BP 122/78  Pulse 78  Ht 5' 0.5" (1.537 m)  Wt 196 lb 6.4 oz (89.086 kg)  BMI 37.71 kg/m2 Well nourished, well developed, in no acute distress HEENT: normal Neck: no JVD Cardiac:  normal S1, S2; RRR; no murmur Lungs:  clear to auscultation bilaterally, no wheezing, rhonchi or rales Abd: soft, nontender, no hepatomegaly Ext: no edema Skin: warm and dry Neuro:  CNs 2-12 intact, no focal abnormalities noted  EKG:  NSR, HR 78, normal axis, no change from prior tracings     ASSESSMENT AND PLAN:  1. Hypertension:  BP is controlled.  She has not taken HCTZ in several weeks.  With her hx of orthostatic intolerance and controlled BP off medications, I have recommended that she remain off of HCTZ.   2. Chronic Diastolic CHF:  Volume stable. 3. COPD:  F/u with pulmonology as planned. 4. Orthostatic Hypotension:  Controlled.  Continue current Rx. 5. Tobacco Abuse:  I have recommended that she quit smoking.  6. Left Ankle Fracture:  She previously saw an orthopedist for other issues.  She no longer goes to him.  She is frustrated by her left ankle that continues to  hurt.  She was not able to wear the cam-walker that she was given in the hospital.  I will refer her to [facility].   7. Disposition:  F/u with Dr. Marca Ancona in 6 mos.  Signed, Tereso Newcomer, PA-C  10/22/2013 3:35 PM

## 2013-10-22 NOTE — Patient Instructions (Signed)
Your physician wants you to follow-up in: 6 months with Dr. Shirlee Latch. You will receive a reminder letter in the mail two months in advance. If you don't receive a letter, please call our office to schedule the follow-up appointment.  You have been referred to [facility] for your broken ankle.

## 2013-11-02 ENCOUNTER — Other Ambulatory Visit: Payer: Self-pay | Admitting: Neurology

## 2013-11-05 NOTE — Telephone Encounter (Signed)
This request came in Friday after hours.  Last OV says:  patient should not use Klonopin due to COPD and addiction history. Possible PTSD relation. ?  Response to Trazodone was paradox. Sleep paralysis.  Continue Oxygen supplement. Klonopin can be used prn, when traveling or when sleeping a in an unfamiliar environment. NOT Daily.  Would you like to refill?  Rx last written about one month ago according to chart.

## 2013-11-26 ENCOUNTER — Telehealth: Payer: Self-pay | Admitting: *Deleted

## 2013-11-26 NOTE — Telephone Encounter (Signed)
I called back, got no answer,  Left message.  They may contact patient assistance directly for refills, application status or a new enrollment form at [phone], or may call us back with any further questions if needed.

## 2013-11-27 NOTE — Telephone Encounter (Signed)
I spoke with spouse.  H ewanted to let me know they have Blue Medicare Ins and that he will be dropping of PAP forms on Thursday when he is in for an appt.

## 2013-11-28 ENCOUNTER — Other Ambulatory Visit: Payer: Self-pay

## 2013-11-28 MED ORDER — LEVETIRACETAM ER 500 MG PO TB24: 500.0000 mg | ORAL_TABLET | Freq: Two times a day (BID) | ORAL | Status: AC

## 2013-11-28 MED ORDER — PREGABALIN 300 MG PO CAPS: 600.0000 mg | ORAL_CAPSULE | Freq: Every day | ORAL | Status: DC

## 2013-11-28 NOTE — Telephone Encounter (Signed)
Patient [place] not yet relocated to [place].  They are still trying to sell their house here in [place].  She is in need of a refill on her Patient Assistance Medications.

## 2013-11-28 NOTE — Telephone Encounter (Signed)
Pt had been reassigned to Dr. Vickey Hugerohmeier, and has been seen by her once.  (formerly [facility] pt) transferred to Dr. Vickey Hugerohmeier. (has hx of siezures).

## 2013-11-29 ENCOUNTER — Other Ambulatory Visit: Payer: Self-pay

## 2013-11-29 MED ORDER — PREGABALIN 300 MG PO CAPS: 600.0000 mg | ORAL_CAPSULE | Freq: Every day | ORAL | Status: AC

## 2013-12-12 NOTE — Telephone Encounter (Signed)
PAP medications have arrived. I called husband. He will pick up tomorrow. He understands that I will keep them in pharmacy until he arrives.

## 2013-12-19 IMAGING — CT CT ELBOW*L* W/O CM
4 of 5 series · 16 of 33 positions shown, 19 images · non-contrast
Comparison: Elbow radiographs 05/13/2012.

CLINICAL DATA: Injured elbow.  Elbow joint effusion.

CT OF THE LEFT ELBOW WITHOUT CONTRAST
TECHNIQUE: Multidetector CT imaging was performed according to the
standard protocol. Multiplanar CT image reconstructions were also
generated.

[Series 5: extremityelbow 2.0 b31 · axial · 0.28mm/px · z∈[-160,-56]mm · 5 of 79 slices shown, 7 images]
[im 14/79  soft-tissue]
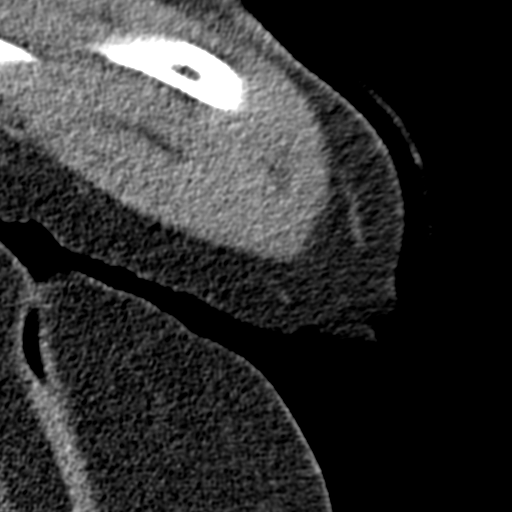
[im 14/79  bone]
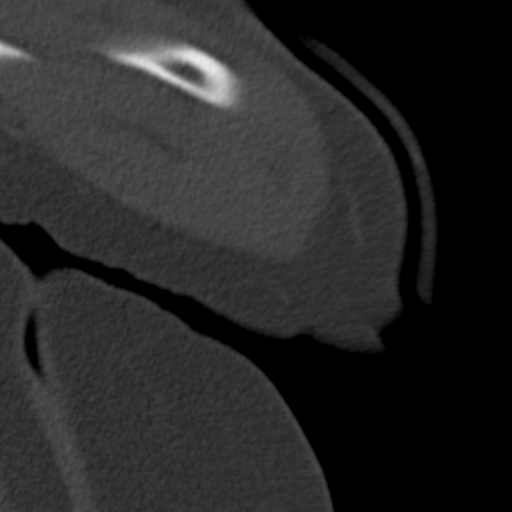
[im 27/79  bone]
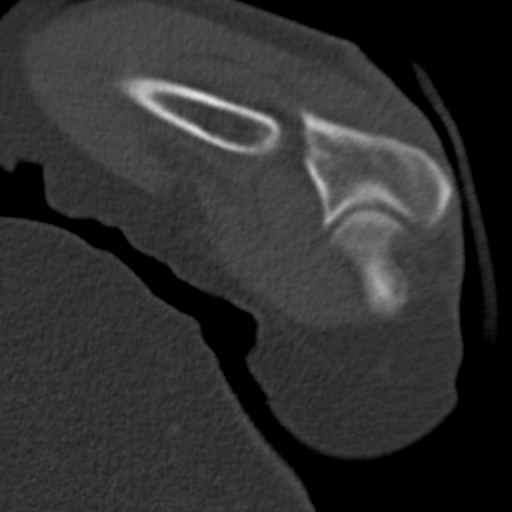
[im 40/79  bone]
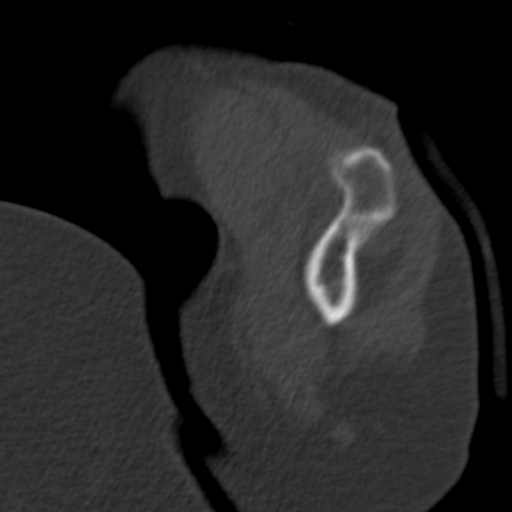
[im 53/79  bone]
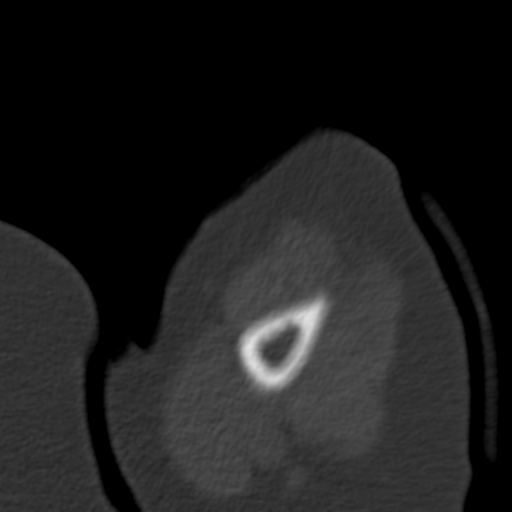
[im 66/79  soft-tissue]
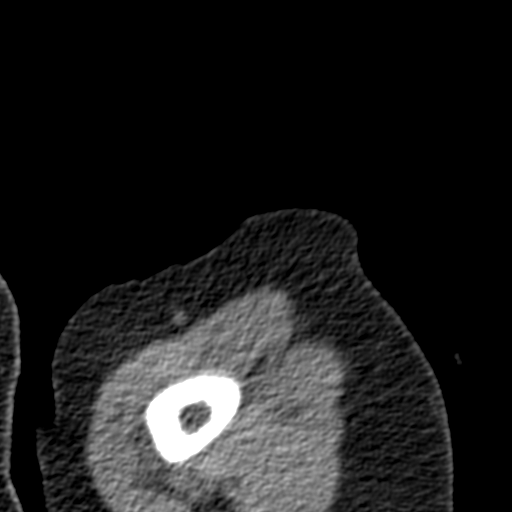
[im 66/79  bone]
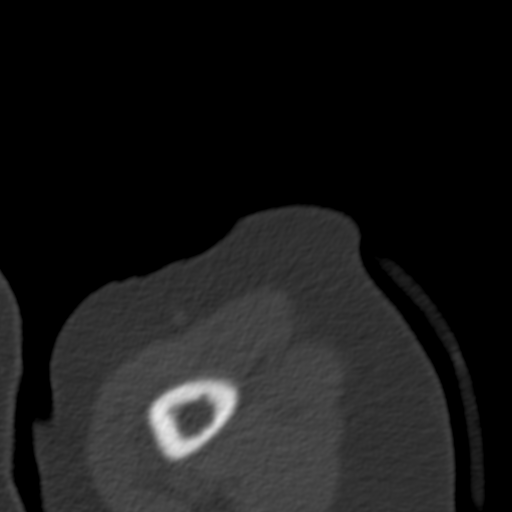

[Series 607: coronal humerus · sagittal · 0.31mm/px · 4 of 48 slices shown, 5 images]
[im 10/48  bone]
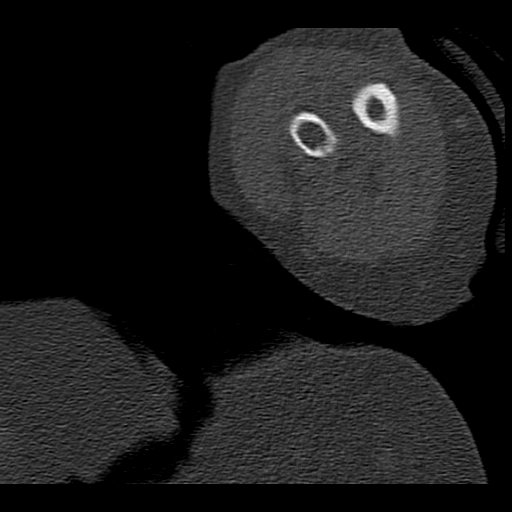
[im 19/48  soft-tissue]
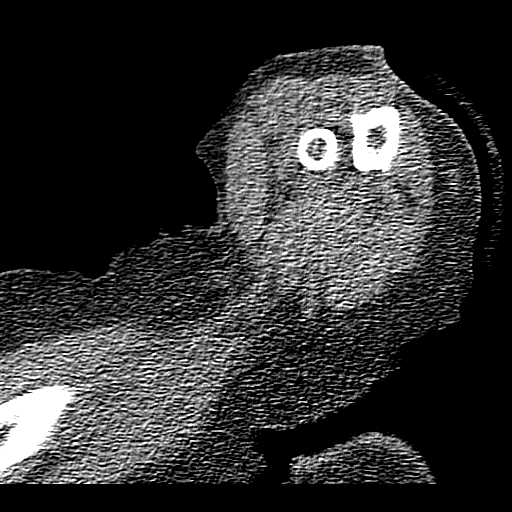
[im 19/48  bone]
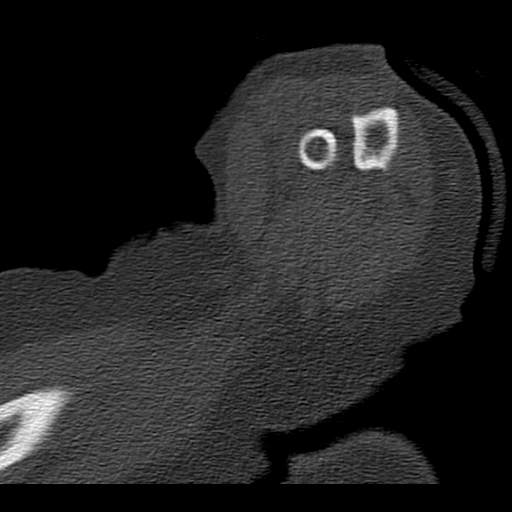
[im 29/48  bone]
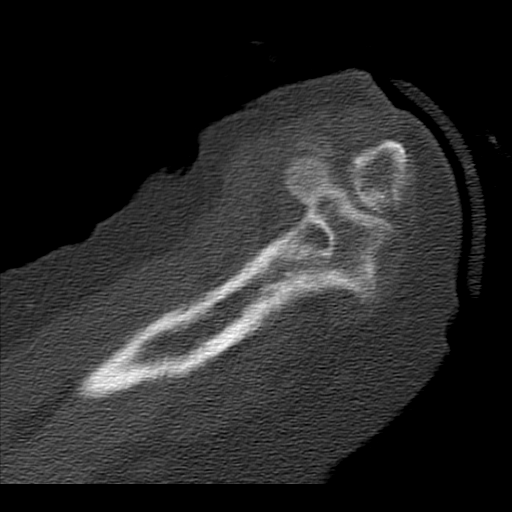
[im 38/48  bone]
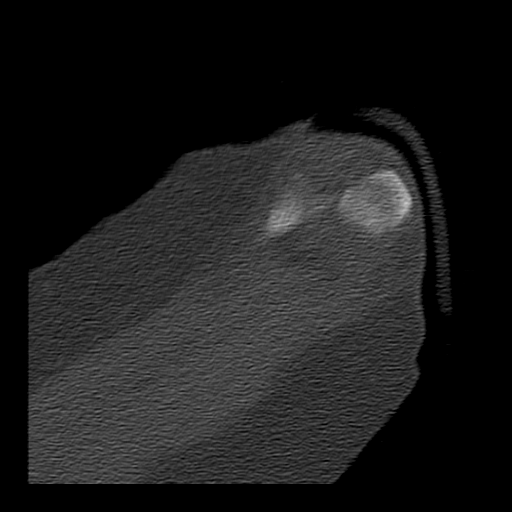

[Series 608: coronal radius ulna · coronal · 0.31mm/px · 3 of 49 slices shown]
[im 10/49  bone]
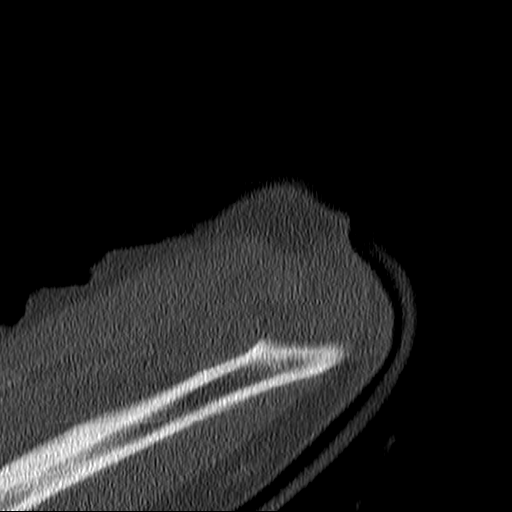
[im 20/49  bone]
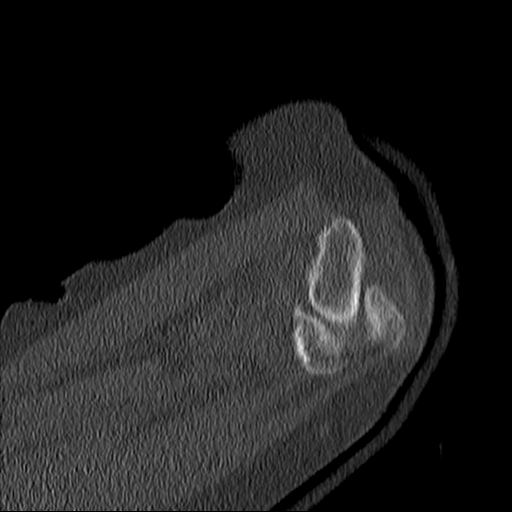
[im 29/49  bone]
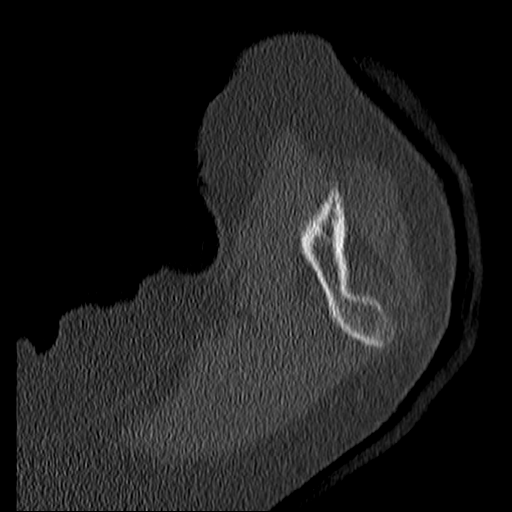

[Series 609: axial humerus · axial · 0.31mm/px · z∈[-115,-51]mm · 4 of 73 slices shown]
[im 13/73  bone]
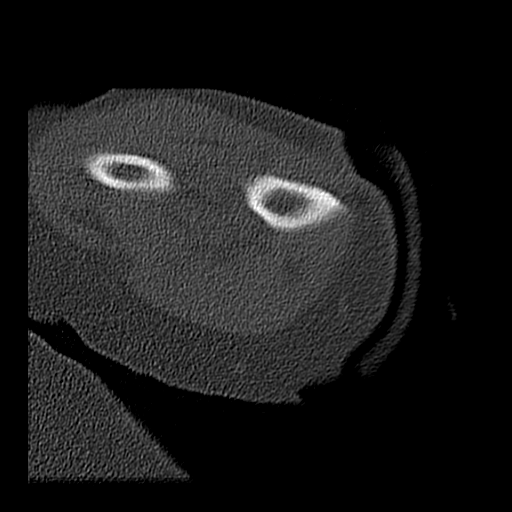
[im 25/73  bone]
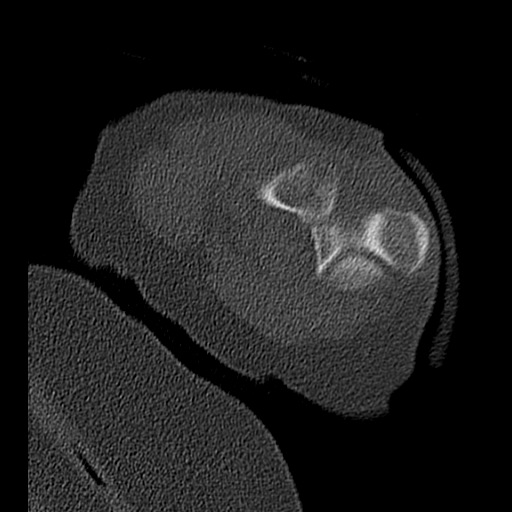
[im 37/73  bone]
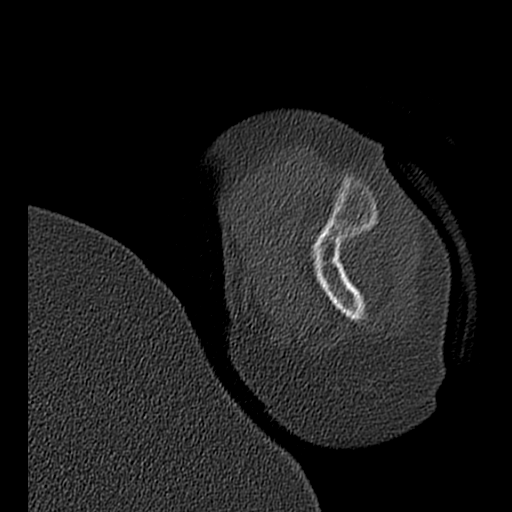
[im 49/73  bone]
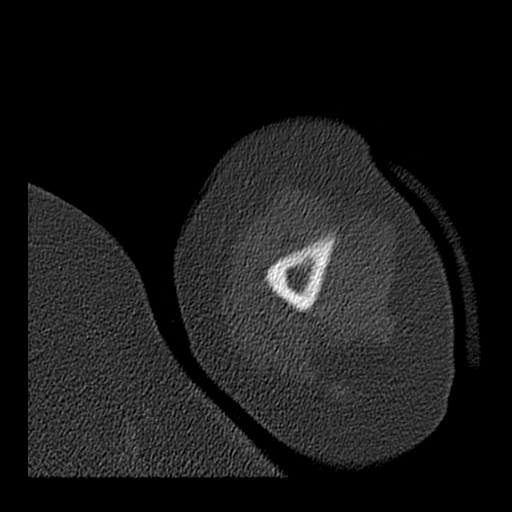

[16 of 33 positions shown; findings below may reference images not displayed]

FINDINGS: There is a minimally impacted fracture at the radial head
neck junction region.  No obvious involvement of the articular
surface of the radial head.  The capitellum is normal.  The distal
humerus is normal.  There is a small joint effusion.
IMPRESSION: 1.  Subtle impacted radial head fracture.  No definite involvement
of the articular surface.
2.  Small joint effusion.
3.  No other fractures are identified.

## 2014-01-08 ENCOUNTER — Other Ambulatory Visit: Payer: Self-pay | Admitting: Neurology

## 2014-01-09 NOTE — Telephone Encounter (Signed)
Rx signed and faxed.

## 2014-01-09 NOTE — Telephone Encounter (Signed)
Former [facility] patient reassigned to Dr Vickey Hugerohmeier per [employer] note from 01/07

## 2014-01-30 ENCOUNTER — Emergency Department (INDEPENDENT_AMBULATORY_CARE_PROVIDER_SITE_OTHER): Payer: Medicare Other

## 2014-01-30 ENCOUNTER — Encounter (HOSPITAL_COMMUNITY): Payer: Self-pay | Admitting: Emergency Medicine

## 2014-01-30 ENCOUNTER — Emergency Department (HOSPITAL_COMMUNITY)
Admission: EM | Admit: 2014-01-30 | Discharge: 2014-01-30 | Disposition: A | Discharge status: Home / Self Care | Payer: Medicare Other | Source: Home / Self Care | Attending: Family Medicine | Admitting: Family Medicine

## 2014-01-30 DIAGNOSIS — T59811A Toxic effect of smoke, accidental (unintentional), initial encounter: Secondary | ICD-10-CM

## 2014-01-30 DIAGNOSIS — T300 Burn of unspecified body region, unspecified degree: Secondary | ICD-10-CM

## 2014-01-30 DIAGNOSIS — Z23 Encounter for immunization: Secondary | ICD-10-CM

## 2014-01-30 DIAGNOSIS — J705 Respiratory conditions due to smoke inhalation: Secondary | ICD-10-CM

## 2014-01-30 MED ORDER — TETANUS-DIPHTH-ACELL PERTUSSIS 5-2.5-18.5 LF-MCG/0.5 IM SUSP
0.5000 mL | Freq: Once | INTRAMUSCULAR | Status: AC
Start: 2014-01-30 — End: 2014-01-30
  Administered 2014-01-30: 0.5 mL via INTRAMUSCULAR

## 2014-01-30 MED ORDER — ERYTHROMYCIN 5 MG/GM OP OINT: TOPICAL_OINTMENT | OPHTHALMIC | Status: AC

## 2014-01-30 MED ORDER — TETANUS-DIPHTH-ACELL PERTUSSIS 5-2.5-18.5 LF-MCG/0.5 IM SUSP
INTRAMUSCULAR | Status: AC
  Filled 2014-01-30: qty 0.5

## 2014-01-30 MED ORDER — PREDNISONE 50 MG PO TABS: 50.0000 mg | ORAL_TABLET | Freq: Every day | ORAL | Status: AC
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
## 2014-02-04 ENCOUNTER — Other Ambulatory Visit: Payer: Self-pay | Admitting: Neurology

## 2014-02-04 ENCOUNTER — Telehealth: Payer: Self-pay | Admitting: Neurology

## 2014-02-04 MED ORDER — CLONAZEPAM 0.5 MG PO TABS: 0.5000 mg | ORAL_TABLET | Freq: Every day | ORAL | Status: AC

## 2014-02-04 NOTE — Telephone Encounter (Signed)
Please let them know the prescription was sent to the pharmacy. Als, let her know [facility] excellent [facility] department and would be a good place to transition care to. If they would like a specific referral then I will defer to Dr Vickey Hugerohmeier when she returns.

## 2014-02-04 NOTE — Telephone Encounter (Signed)
Pt's husband calls because they are making a permanent move to [place] in 2 weeks.  [facility]'s Cl[facility]epam.  He says that typically Dr. Vickey Hugerohmeier does 30 day refills but they feel that with the move, they may not have enough time to find a neurologist who will prescribe the medication.  [facility] [address]  Due to the pt's extensive neurologic problems, husband is also requesting a referral or a recommendation to a good [place].  He is requesting Dr. Vickey Hugerohmeier or the other physicians in the office to help them locate an office.

## 2014-02-04 NOTE — Telephone Encounter (Signed)
Dr Dohmeier is out of the office, Rx has been entered for 90 day supply, [facility].  Regarding the referral, it appears the message was sent to nurses, provider and pool from sleep lab.

## 2014-02-05 ENCOUNTER — Telehealth: Payer: Self-pay | Admitting: Neurology

## 2014-02-05 DIAGNOSIS — R410 Disorientation, unspecified: Secondary | ICD-10-CM

## 2014-02-05 DIAGNOSIS — F05 Delirium due to known physiological condition: Secondary | ICD-10-CM

## 2014-02-05 DIAGNOSIS — R0902 Hypoxemia: Secondary | ICD-10-CM

## 2014-02-05 NOTE — Telephone Encounter (Signed)
Shared Dr Minus BreedingSumner's message below thru VM message

## 2014-02-05 NOTE — Telephone Encounter (Signed)
Patient's husband says would like to get referral for patient to [facility] [place]--understands Dr. Vickey Hugerohmeier is out of the office until next week.

## 2014-02-05 NOTE — Telephone Encounter (Signed)
Called patient's husband Ron to inform him that Dr. Vickey Hugerohmeier would not be back in the office until 02/13/14 and that Dr. Vickey Hugerohmeier would have to be the one to put the referral in for the patient to [facility] [place]. Patient's husband understood and would like Dr. Vickey Hugerohmeier to put the referral in when she gets back because they are moving around that time. Please advise

## 2014-02-25 NOTE — Telephone Encounter (Signed)
[facility] [place] . Neurology department referral . Please put into EPIC and forward to MAYO> CD

## 2014-02-26 NOTE — Telephone Encounter (Signed)
Referral has been faxed to the Neurology department at the [facility] [place], [place].. I have been unable to reach the patient or patient's husband, there is only one telephone number listed in Epic.

## 2014-12-05 ENCOUNTER — Encounter (HOSPITAL_BASED_OUTPATIENT_CLINIC_OR_DEPARTMENT_OTHER): Payer: Self-pay | Admitting: Orthopedic Surgery

## 2016-05-28 ENCOUNTER — Ambulatory Visit: Admit: 2016-05-28 | Discharge: 2016-05-28 | Attending: Neurology | Primary: Family Medicine

## 2016-05-28 ENCOUNTER — Encounter: Attending: Neurology | Primary: Family Medicine

## 2016-05-28 DIAGNOSIS — G43711 Chronic migraine without aura, intractable, with status migrainosus: Principal | ICD-10-CM

## 2016-05-28 DIAGNOSIS — R5383 Other fatigue: Secondary | ICD-10-CM

## 2016-05-28 DIAGNOSIS — R42 Dizziness and giddiness: Secondary | ICD-10-CM

## 2016-05-28 DIAGNOSIS — M791 Myalgia, unspecified site: Secondary | ICD-10-CM

## 2016-05-28 DIAGNOSIS — Z79899 Other long term (current) drug therapy: Secondary | ICD-10-CM

## 2016-05-28 DIAGNOSIS — R51 Headache: Principal | ICD-10-CM

## 2016-05-28 DIAGNOSIS — R2689 Other abnormalities of gait and mobility: Secondary | ICD-10-CM

## 2016-05-28 MED ORDER — TOPIRAMATE 25 MG PO TABS
3 refills | Status: CP
Start: 2016-05-28 — End: 2016-07-30

## 2016-05-28 NOTE — Patient Instructions
1. Topamax 25 mg: Take 1 tab at bedtime for 7 days, then take 2 tabs at bedtime for 7 days, then 3 tabs at bedtime.     2. Follow up in 9 weeks for Botox appointment.       Judge StallWhitney Nimmo, ARNP:   [facility]  [phone]  [address] [place], [place] [phone].

## 2016-05-28 NOTE — Patient Instructions
1. Topamax 25 mg: Take 1 tab at bedtime for 7 days, then take 2 tabs at bedtime for 7 days, then 3 tabs at bedtime.     2. Follow up in 9 weeks for Botox appointment.

## 2016-06-01 NOTE — Progress Notes
Teaching/Attestation Statement  I saw and evaluated the patient.  I reviewed the Physician Assistant note and agree with the assessment and plan. See Physician Assistant's note for details.

## 2016-07-28 NOTE — Progress Notes
?  In concert with the headaches and her concommittant insomnia, she reports word-finding difficulty which greatly concerns her.  ??  Note: Botox is selected for headache prophylaxis based on previously failure of Effexor as well as patient's diabetes, REM behavior Disorder, and heart disease provide significant contraindications to standard anti-migraine preventatives. Of note, in light of patient's word-finding difficulty currently present and her age, Topamax is relatively contraindicated. Depakote is also relatively contrainidcated given the potential for weight gain in this patient who has a Hx of diabetes.  ??  Botox injected on 02/20/2016  ?  04/30/16:  She returns reporting HAs had decreased in intensity from an 8-9/10 to a 5/10 after Botox, but have returned to a 9/10 over the past 2-3 weeks.   ASSESSMENT:      1) Chronic migraine, intractable w/ partial response to Botox       ?PLAN:     1) Will perform repeat trial of Botox; Pt agrees to trial.     2) Trial of acupuncture      Of note:  Pt's insurance company declined preapproval for acupuncture    05/28/16: Patient being seen today as follow up from last Botox injection 3 weeks ago. Patient reports a significant decrease in pain level with headaches to a 7/10 from a prior 10/10. Patient definitely has noticed improvement in her headaches and would like to continue the Botox therapy.     ASSESSMENT:      1) Chronic migraine, intractable w/ increased response to Botox       Plan:      1) Continue Botox injection due to therapy being affective in reducing intensity of headaches.   2) Start taking Topamax 25 mg 1 tab at bedtime for 7 days, then take 2 tabs at bedtime for 7 days, then 3 tabs at bedtime.  3) Follow up in 9 weeks for Botox injections.     Thank you,    Gerome SamJean Sheridan-Young, ARNP.

## 2016-07-28 NOTE — Progress Notes
?  In concert with the headaches and her concommittant insomnia, she reports word-finding difficulty which greatly concerns her.  ??  Note: Botox is selected for headache prophylaxis based on previously failure of Effexor as well as patient's diabetes, REM behavior Disorder, and heart disease provide significant contraindications to standard anti-migraine preventatives. Of note, in light of patient's word-finding difficulty currently present and her age, Topamax is relatively contraindicated. Depakote is also relatively contrainidcated given the potential for weight gain in this patient who has a Hx of diabetes.  ??  Botox injected on 02/20/2016  ?  04/30/16:  She returns reporting HAs had decreased in intensity from an 8-9/10 to a 5/10 after Botox, but have returned to a 9/10 over the past 2-3 weeks.   ASSESSMENT:      1) Chronic migraine, intractable w/ partial response to Botox       ?PLAN:     1) Will perform repeat trial of Botox; Pt agrees to trial.     2) Trial of acupuncture      Of note:  Pt's insurance company declined preapproval for acupuncture    05/28/16: Patient being seen today as follow up from last Botox injection 3 weeks ago. Patient reports a significant decrease in pain level with headaches to a 7/10 from a prior 10/10. Patient definitely has noticed improvement in her headaches and would like to continue the Botox therapy.     ASSESSMENT:      1) Chronic migraine, intractable w/ increased response to Botox       PLAN:    1) Continue Botox injection due to therapy being affective in reducing intensity of headaches.   2) Start taking Topamax 25 mg 1 tab at bedtime for 7 days, then take 2 tabs at bedtime for 7 days, then 3 tabs at bedtime.  3) Follow up in 9 weeks for Botox injections.     Thank you,    Gerome SamJean Sheridan-Young, ARNP.

## 2016-07-28 NOTE — Progress Notes
?  In concert with the headaches and her concommittant insomnia, she reports word-finding difficulty which greatly concerns her.  ??  Note: Botox is selected for headache prophylaxis based on previously failure of Effexor as well as patient's diabetes, REM behavior Disorder, and heart disease provide significant contraindications to standard anti-migraine preventatives. Of note, in light of patient's word-finding difficulty currently present and her age, Topamax is relatively contraindicated. Depakote is also relatively contrainidcated given the potential for weight gain in this patient who has a Hx of diabetes.  ??  Botox injected on 02/20/2016  ?  04/30/16:  She returns reporting HAs had decreased in intensity from an 8-9/10 to a 5/10 after Botox, but have returned to a 9/10 over the past 2-3 weeks.   ASSESSMENT:      1) Chronic migraine, intractable w/ partial response to Botox       ?PLAN:     1) Will perform repeat trial of Botox; Pt agrees to trial.     2) Trial of acupuncture      Of note:  Pt's insurance company declined preapproval for acupuncture    05/28/16: Patient being seen today as follow up from last Botox injection 3 weeks ago. Patient reports a significant decrease in pain level with headaches to a 7/10 from a prior 10/10. Patient defiantly has noticed improvement in her headaches and would like to continue the Botox therapy.     ASSESSMENT:      1) Chronic migraine, intractable w/ increased response to Botox       Plan:      1) Continue Botox injection due to therapy being affective in reducing intensity of headaches.   2) Start taking Topamax 25 mg 1 tab at bedtime for 7 days, then take 2 tabs at bedtime for 7 days, then 3 tabs at bedtime.  3) Follow up in 9 weeks for Botox injections.     Thank you,    Gerome SamJean Sheridan-Young, ARNP.
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
## 2016-07-30 ENCOUNTER — Ambulatory Visit: Admit: 2016-07-30 | Discharge: 2016-07-30 | Attending: Neurology | Primary: Family Medicine

## 2016-07-30 DIAGNOSIS — Z79899 Other long term (current) drug therapy: Secondary | ICD-10-CM

## 2016-07-30 DIAGNOSIS — R51 Headache: Principal | ICD-10-CM

## 2016-07-30 DIAGNOSIS — M791 Myalgia, unspecified site: Secondary | ICD-10-CM

## 2016-07-30 DIAGNOSIS — R2681 Unsteadiness on feet: Principal | ICD-10-CM

## 2016-07-30 DIAGNOSIS — R2689 Other abnormalities of gait and mobility: Secondary | ICD-10-CM

## 2016-07-30 DIAGNOSIS — R42 Dizziness and giddiness: Secondary | ICD-10-CM

## 2016-07-30 DIAGNOSIS — G43719 Chronic migraine without aura, intractable, without status migrainosus: Secondary | ICD-10-CM

## 2016-07-30 DIAGNOSIS — R5383 Other fatigue: Secondary | ICD-10-CM

## 2016-07-30 MED ORDER — NOVOLOG 100 UNIT/ML SC SOLN
3 refills
Start: 2016-07-30 — End: 2018-04-27

## 2016-07-30 MED ORDER — TOPIRAMATE 100 MG PO TABS
2 refills | Status: CP
Start: 2016-07-30 — End: 2017-03-18

## 2016-07-30 MED ORDER — LEVEMIR FLEXTOUCH 100 UNIT/ML SC SOPN
11 refills
Start: 2016-07-30 — End: 2018-04-27

## 2016-07-30 MED ORDER — ONABOTULINUMTOXINA 100 UNITS IJ SOLR
175 [IU] | Freq: Once | INTRAMUSCULAR | Status: CP
Start: 2016-07-30 — End: ?

## 2016-07-30 NOTE — Progress Notes
?  In concert with the headaches and her concommittant insomnia, she reports word-finding difficulty which greatly concerns her.  ??  Note: Botox is selected for headache prophylaxis based on previously failure of Effexor as well as patient's diabetes, REM behavior Disorder, and heart disease provide significant contraindications to standard anti-migraine preventatives. Of note, in light of patient's word-finding difficulty currently present and her age, Topamax is relatively contraindicated. Depakote is also relatively contrainidcated given the potential for weight gain in this patient who has a Hx of diabetes.  ??  Botox injected on?02/20/2016  ??  04/30/16:  She?returns reporting HAs had decreased in intensity from an 8-9/10 to a 5/10 after Botox, but have returned to a 9/10 over the past 2-3 weeks.   ASSESSMENT:  ????1) Chronic migraine, intractable w/ partial response to Botox?????  ?PLAN:   ??1) Will perform repeat?trial of Botox; Pt agrees to trial.   ??2) Trial of acupuncture??  ?  Of note:  [employer]'s insurance company declined preapproval for acupuncture    ?05/04/2016: Botox administered    05/28/16: Patient being seen today as follow up from last Botox injection 3 weeks ago. Patient reports a significant decrease in pain level with headaches to a 7/10 from a prior 10/10. Patient definitely has noticed improvement in her headaches and would like to continue the Botox therapy.   ASSESSMENT:  ????1) Chronic migraine, intractable w/ increased response to Botox?????  PLAN:    1) Continue Botox injection due to therapy being affective in reducing intensity of headaches.   2) Start taking Topamax 25 mg 1 tab at bedtime for 7 days, then take 2 tabs at bedtime for 7 days, then 3 tabs at bedtime.  3) Follow up in 9 weeks for Botox injections.     Today: Pt returns reporting no significant change in HA despite escalation of Topamax to 75 mg qhs. Pt denies SE on Botox (does note slight fingertip

## 2016-07-30 NOTE — Progress Notes
tingling, but uncertain if onset w/ Topamax). Of note, the patient's PCP refered Pt to Physical Therapy for gait problems, but PT noted nystagmus on exam and recommended [facility]    PROCEDURE NOTE:  Onabotulinum toxin A (Botox) Injections  Date of procedure: 07/30/2016  Indication: Chronic Migraine  Date of last injection: 05/04/2016  Symptom most relieved: HA intensity  Adverse effects: None  Procedure: Patient counseled re: risks (including fever, local injection reaction, pain, bruise, excessive localized weakness), benefits, and alternatives. Prior to the procedure, final time out performed with all members of operative team verifying informed consent, correct patient, procedure, side/level/site, correct positioning, and special equipment (as applicable) performed. Botulinum preparation: 10 units/0.1 cc normal saline; Lot number: [id number], expiration date: 4/20  Total injected: 175; 25 units wasted  Muscles injected:  Bilateral frontalis 5 units/site in a linear pattern, 4 sites = 20 units  Bilateral corrugators 5 units/site, 2 sites = 10 units  Procerus 5 units/site = 5 units  Bilateral temporalis 5 units/site, 4 sites plus 10 units/site, 4 sites = 60 units  Bilateral occipitalis 5 units/site, 6 units = 30 units  Bilateral splenius capitis 5 units/site, 4 sites = 20 units  Bilateral trapezius 5 units/site, 6 sites = 30 units    Outcome: Patient tolerated the procedure well.  Plan:   1) Increase Topamax to 100 mg qhs   2) [facility]
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
## 2016-10-19 DIAGNOSIS — G43711 Chronic migraine without aura, intractable, with status migrainosus: Principal | ICD-10-CM

## 2016-10-19 MED ORDER — TOPIRAMATE 25 MG PO TABS
0 refills | Status: CP
Start: 2016-10-19 — End: 2016-10-19

## 2016-10-19 MED ORDER — TOPIRAMATE 25 MG PO TABS
0 refills | Status: CP
Start: 2016-10-19 — End: 2018-04-27

## 2016-11-02 ENCOUNTER — Ambulatory Visit: Admit: 2016-11-02 | Discharge: 2016-11-02 | Attending: Neurology | Primary: Family Medicine

## 2016-11-02 DIAGNOSIS — M791 Myalgia, unspecified site: Secondary | ICD-10-CM

## 2016-11-02 DIAGNOSIS — R42 Dizziness and giddiness: Secondary | ICD-10-CM

## 2016-11-02 DIAGNOSIS — R51 Headache: Secondary | ICD-10-CM

## 2016-11-02 DIAGNOSIS — R5383 Other fatigue: Secondary | ICD-10-CM

## 2016-11-02 DIAGNOSIS — R2689 Other abnormalities of gait and mobility: Secondary | ICD-10-CM

## 2016-11-02 DIAGNOSIS — G43711 Chronic migraine without aura, intractable, with status migrainosus: Principal | ICD-10-CM

## 2016-11-02 DIAGNOSIS — Z79899 Other long term (current) drug therapy: Secondary | ICD-10-CM

## 2016-11-02 MED ORDER — OMEPRAZOLE 40 MG PO CPDR
2 refills
Start: 2016-11-02 — End: ?

## 2016-11-02 MED ORDER — ONABOTULINUMTOXINA 100 UNITS IJ SOLR
175 [IU] | Freq: Once | INTRAMUSCULAR | Status: CP
Start: 2016-11-02 — End: ?

## 2016-11-02 MED ORDER — BD PEN NEEDLE NANO U/F 32G X 4 MM MISC
5 refills
Start: 2016-11-02 — End: 2018-04-29

## 2016-11-02 MED ORDER — TRAZODONE HCL 150 MG PO TABS
11 refills
Start: 2016-11-02 — End: 2016-11-02

## 2016-11-02 MED ORDER — DONEPEZIL HCL 10 MG PO TABS
11 refills
Start: 2016-11-02 — End: 2018-04-27

## 2016-11-02 NOTE — Progress Notes
Neurology Faculty Clinic  [facility]  Procedure Visit  Onabotulinum toxin injection    DATE:11/02/2016     CHIEF COMPLAINT: No chief complaint on file.      HISTORY:  This patient is a 70 yrs old female who is undergoing Botox for: chronic migraine. 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 Benjamine Spraguevejic 12/30/2015  ?01/06/2016:  She reports a fall out of bed in Oct-Nov time frame, striking her face and yielding bilateral racoon eyes. Evaluation in local ED with MRI was normal at that time. She reports onset of daily HAs since. HAs are described as: holocephalic; quality: throbbing pressure-like sharp/stabbing; intensity-- 9/10; duration-- entire day; associated with: nausea, sono- and photo- phobia , visual symptoms (blurred), increased pain with activity, alleviated with rest and Percocet (given small supply)  Prodrome: none known  Triggers include: none  Prophylactic meds: Effexor  Other acute meds: none  ?In concert with the headaches and her concommittant insomnia, she reports word-finding difficulty which greatly concerns her.  ?Note: Botox is selected for headache prophylaxis based on previously failure of Effexor as well as patient's diabetes, REM behavior Disorder, and heart disease provide significant contraindications to standard anti-migraine preventatives. Of note, in light of patient's word-finding difficulty currently present and her age, Topamax is relatively contraindicated. Depakote is also relatively contrainidcated given the potential for weight gain in this patient who has a Hx of diabetes.  ??  Botox injected on?02/20/2016  ??  04/30/16:  She?returns reporting HAs had decreased in intensity from an 8-9/10 to a 5/10 after Botox, but have returned to a 9/10 over the past 2-3 weeks.   ASSESSMENT:  ????1) Chronic migraine, intractable w/ partial response to Botox?????  ?PLAN:

## 2016-11-02 NOTE — Progress Notes
??  1) Will perform repeat?trial of Botox; Pt agrees to trial.   ??2) Trial of acupuncture??  ??  Of note: ?[employer]'s insurance company declined preapproval for acupuncture  ?  ?05/04/2016: Botox administered  ?  05/28/16: Patient being seen today as follow up from last Botox injection 3 weeks ago. Patient reports a significant decrease in pain level with headaches to a 7/10 from a prior 10/10. Patient definitely?has noticed improvement in her headaches and would like to continue the Botox therapy.   ASSESSMENT:  ????1) Chronic migraine, intractable w/ increased response to Botox?????  PLAN: ?  1) Continue Botox injection due to therapy being affective in reducing intensity of headaches.   2) Start taking Topamax 25 mg 1 tab at bedtime for 7 days, then take 2 tabs at bedtime for 7 days, then 3 tabs at bedtime.  3) Follow up in 9 weeks for Botox injections.     07/30/2016: Pt returns reporting no significant change in HA despite escalation of Topamax to 75 mg qhs. Pt denies SE on Botox (does note slight fingertip tingling, but uncertain if onset w/ Topamax). Of note, the patient's PCP refered Pt to Physical Therapy for gait problems, but PT noted nystagmus on exam and [facility]. Botox administered.     Today: She reports the headaches decreased intensity following the last set of Botox injections, but it increased in intensity over the past 2 weeks.    PROCEDURE NOTE:  Onabotulinum toxin A (Botox) Injections  Date of procedure: 11/02/2016  Indication: Chronic Migraine  Date of last injection:   07/30/2016  Symptom most relieved:  headache  Adverse effects: None  Procedure: Patient counseled re: risks (including fever, local injection reaction, pain, bruise, excessive localized weakness), benefits, and alternatives. Prior to the procedure, final time out performed with all members of operative team verifying informed consent, correct patient,

## 2016-11-02 NOTE — Progress Notes
procedure, side/level/site, correct positioning, and special equipment (as applicable) performed. Botulinum preparation: 10 units/0.1 cc normal saline; Lot number: [id number], expiration date: 4/20  Total injected: 175; 25 units wasted  Muscles injected:  Bilateral frontalis 5 units/site in a linear pattern, 4 sites = 20 units  Bilateral corrugators 5 units/site, 2 sites = 10 units  Procerus 5 units/site = 5 units  Bilateral temporalis 5 units/site, 4 sites plus 10 units/site, 4 sites = 60 units  Bilateral occipitalis 5 units/site, 6 units = 30 units  Bilateral splenius capitis 5 units/site, 4 sites = 20 units  Bilateral trapezius 5 units/site, 6 sites = 30 units    Outcome: Patient tolerated the procedure well.  Plan: Follow-up in 3 months

## 2017-02-01 ENCOUNTER — Ambulatory Visit: Admit: 2017-02-01 | Discharge: 2017-02-01 | Attending: Neurology | Primary: Family Medicine

## 2017-02-01 DIAGNOSIS — Z79899 Other long term (current) drug therapy: Secondary | ICD-10-CM

## 2017-02-01 DIAGNOSIS — R5383 Other fatigue: Secondary | ICD-10-CM

## 2017-02-01 DIAGNOSIS — R51 Headache: Principal | ICD-10-CM

## 2017-02-01 DIAGNOSIS — Z794 Long term (current) use of insulin: Secondary | ICD-10-CM

## 2017-02-01 DIAGNOSIS — F17210 Nicotine dependence, cigarettes, uncomplicated: Secondary | ICD-10-CM

## 2017-02-01 DIAGNOSIS — R569 Unspecified convulsions: Secondary | ICD-10-CM

## 2017-02-01 DIAGNOSIS — R2689 Other abnormalities of gait and mobility: Secondary | ICD-10-CM

## 2017-02-01 DIAGNOSIS — E119 Type 2 diabetes mellitus without complications: Secondary | ICD-10-CM

## 2017-02-01 DIAGNOSIS — Z79891 Long term (current) use of opiate analgesic: Secondary | ICD-10-CM

## 2017-02-01 DIAGNOSIS — Z888 Allergy status to other drugs, medicaments and biological substances status: Secondary | ICD-10-CM

## 2017-02-01 DIAGNOSIS — M791 Myalgia, unspecified site: Secondary | ICD-10-CM

## 2017-02-01 DIAGNOSIS — Z880 Allergy status to penicillin: Secondary | ICD-10-CM

## 2017-02-01 DIAGNOSIS — Z881 Allergy status to other antibiotic agents status: Secondary | ICD-10-CM

## 2017-02-01 DIAGNOSIS — R42 Dizziness and giddiness: Secondary | ICD-10-CM

## 2017-02-01 MED ORDER — COLCRYS 0.6 MG PO TABS
PRN
Start: 2017-02-01 — End: ?

## 2017-02-01 MED ORDER — TRAZODONE HCL 150 MG PO TABS
11 refills | PRN
Start: 2017-02-01 — End: 2018-04-27

## 2017-02-01 MED ORDER — V-GO 40 KIT
PACK | 0 refills
Start: 2017-02-01 — End: ?

## 2017-02-01 MED ORDER — HUMALOG 100 UNIT/ML SC SOLN
100 [IU] | 0 refills
Start: 2017-02-01 — End: 2018-04-27

## 2017-02-01 NOTE — Progress Notes
HumaLOG vial  11/29/16  Yes Information, Historical   linagliptin (TRADJENTA) 5 MG Tablet Take 5 mg by mouth.   Yes Information, Historical   NovoLOG vial INJ 10 UNITS SC TID WC 06/17/16  Yes Information, Historical   nystatin (NYSTOP) 100000 UNIT/GM Powder APP TOPICALLY TID 11/07/15  Yes Information, Historical   omeprazole (PriLOSEC) 40 MG PO Capsule Delayed Release TK 1 C PO BID 08/12/16  Yes Information, Historical   PNEUMOCOCCAL 13-VALENT CONJUGATE Suspension ADM 0.5ML IM UTD 12/06/15  Yes Information, Historical   promethazine (PHENERGAN) 25 MG Tablet TK 1 T PO Q 4 H FOR 14 DAYS PRF NAUSEA OR VOM 11/04/15  Yes Information, Historical   QUEtiapine (SEROquel) 50 MG Tablet TK 3 TS PO D HS 10/17/15  Yes Information, Historical   topiramate (TOPAMAX) 25 MG PO Tablet TAKE 1 TABLET BY MOUTH AT BEDTIME FOR 7 DAYS, THEN 2 TABLETS AT BEDTIME FOR 7 DAYS, THEN 3 TABLETS EVERY NIGHT AT BEDTIME 10/19/16  Yes Gerome SamSheridan-Young, Jean, ARNP   traZODone (DESYREL) 150 MG PO Tablet as needed. 11/20/16  Yes Information, Historical   venlafaxine (EFFEXOR-XR) 75 MG Capsule Extended Release 24 Hour TK 1 C PO D 11/30/15  Yes Information, Historical   VENTOLIN HFA 108 (90 BASE) MCG/ACT Aerosol Solution as needed. 01/12/16  Yes Information, Historical   VICTOZA 18 MG/3ML Solution Pen-injector daily. 02/26/16  Yes Information, Historical   BD PEN NEEDLE NANO U/F 32G X 4 MM XX Miscellaneous USE TID WITH HUMALOG QUICKPEN 08/25/16   Information, Historical   clotrimazole-betamethasone (LOTRISONE) 1-0.05 % Cream APP EXT AA BID 11/03/15   Information, Historical   glimepiride (AMARYL) 1 MG Tablet TK 1 AND 1/2 TS PO QHS 12/24/15   Information, Historical   LEVEMIR FlexTouch INJ 60 UNITS SC DAILY 07/12/16   Information, Historical   Liraglutide (VICTOZA SC) into the skin.    Information, Historical   oxyCODONE-acetaminophen (PERCOCET) 7.5-325 MG Tablet as needed. 02/06/16   Information, Historical

## 2017-02-01 NOTE — Progress Notes
Triggers include: none  Prophylactic meds: Effexor  Other acute meds: none  ?In concert with the headaches and her concommittant insomnia, she reports word-finding difficulty which greatly concerns her.  ??  Note: Botox is selected for headache prophylaxis based on previously failure of Effexor as well as patient's diabetes, REM behavior Disorder, and heart disease provide significant contraindications to standard anti-migraine preventatives. Of note, in light of patient's word-finding difficulty currently present and her age, Topamax is relatively contraindicated. Depakote is also relatively contrainidcated given the potential for weight gain in this patient who has a Hx of diabetes.  ??  Botox injected on?02/20/2016  ??  04/30/16:  She?returns reporting HAs had decreased in intensity from an 8-9/10 to a 5/10 after Botox, but have returned to a 9/10 over the past 2-3 weeks.   ASSESSMENT:  ????1) Chronic migraine, intractable w/ partial response to Botox?????  ?PLAN:   ??1) Will perform repeat?trial of Botox; Pt agrees to trial.   ??2) Trial of acupuncture??  ??  Of note: ?[employer]'s insurance company declined preapproval for acupuncture  ?  ?05/04/2016: Botox administered  ?  05/28/16: Patient being seen today as follow up from last Botox injection 3 weeks ago. Patient reports a significant decrease in pain level with headaches to a 7/10 from a prior 10/10. Patient definitely?has noticed improvement in her headaches and would like to continue the Botox therapy.   ASSESSMENT:  ????1) Chronic migraine, intractable w/ increased response to Botox?????  PLAN: ?  1) Continue Botox injection due to therapy being affective in reducing intensity of headaches.   2) Start taking Topamax 25 mg 1 tab at bedtime for 7 days, then take 2 tabs at bedtime for 7 days, then 3 tabs at bedtime.  3) Follow up in 9 weeks for Botox injections.   ?  07/30/2016: Pt returns reporting no significant change in HA despite

## 2017-02-01 NOTE — Progress Notes
CARDIAC:  No chest pain, dyspnea on exertion, or palpitations.  RESPIRATORY:  No cough, wheeze, hemoptysis, or dyspnea.   GI:  No heartburn, regurgitation, nausea, emesis, hematochezia, diarrhea.  GU:  No dysuria or hematuria.  No urinary incontinence or urinary frequency change.  MUSCULOSKELETAL:  No migratory arthralgias, muscle pain, proximal muscle weakness.  DERMATOLOGIC:  No skin rash or ecchymosis.  NEUROLOGIC:  See HPI  PSYCHIATRIC:  Denies depression or suicidal ideation.  ENDOCRINE:  No heat or cold intolerance, change in hair, excess sweating.  LYMPH/HEME:  No generalized lymphadenopathy, no ecchymosis.     PAST HISTORY:   Past Medical History:   Diagnosis Date   ? Dizziness    ? Fatigue    ? Headache    ? Loss of balance    ? Muscle pain        FAMILY HISTORY:   Family History   Problem Relation Age of Onset   ? Hypertension Mother    ? Diabetes Mother        SOCIAL HISTORY:  reports that she has been smoking.  She has a 7.50 pack-year smoking history. She has quit using smokeless tobacco. She reports that she does not drink alcohol or use illicit drugs.    ALLERGIES:Flagyl [metronidazole]; Pcn [penicillins]; and Tegretol [carbamazepine]    MEDICATIONS:  HOME MEDICATIONS:    Prior to Admission medications    Medication Sig Start Date End Date Taking? Authorizing Provider   atorvastatin (LIPITOR) 10 MG Tablet TK 1 T PO D 12/24/15  Yes Information, Historical   carvedilol (COREG) 6.25 MG Tablet TK 1 T PO BID 12/11/15  Yes Information, Historical   clonazePAM (KlonoPIN) 0.5 MG Tablet TK 1 AND 1/2 T PO HS 11/29/15  Yes Information, Historical   COLCRYS 0.6 MG PO Tablet as needed. 12/13/16  Yes Information, Historical   donepezil (ARICEPT) 10 MG PO Tablet TK 1 T PO ONCE A DAY HS 08/25/16  Yes Information, Historical   FLULAVAL QUADRIVALENT Suspension ADM 0.5ML IM UTD 12/06/15  Yes Information, Historical   glimepiride (AMARYL) 2 MG Tablet TK 1 T PO BID   Yes Information, Historical
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
## 2017-02-01 NOTE — Progress Notes
escalation of Topamax to 75 mg qhs. Pt denies SE on Botox (does note slight fingertip tingling, but uncertain if onset w/ Topamax). Of note, the patient's PCP refered Pt to Physical Therapy for gait problems, but PT noted nystagmus on exam and [facility]. Botox administered.  Plan:   1) Increase Topamax to 100 mg qhs   2) [facility]    11/02/2016: She reports the headaches decreased intensity following the last set of Botox injections, but it increased in intensity over the past 2 weeks.    Today:  She returns reporting that the headaches did improve marginally following the last Botox injections in December 2017, right continue to occur on a daily basis. The headaches remain holocephalic but are described as sharp and 8/10 intensity, without sonophobia or photophobia, nausea, or neck pain. The headaches last throughout the entire day. Of note, the patient apparently increased the Topamax from 100 mg daily at bedtime as ordered back in September 2 125 mg daily at bedtime apparently in December timeframe.    Of note, the patient experienced a fall out of bed on 19 December 2016, falling on her right hip and striking the right posterior side of her head. She denies alteration or loss of consciousness and he did experience an increase in right-sided headache for a couple days. However she has had persistent right-sided low back pain and evaluation disclosed a subtle right sacral fracture on MRI. She is being evaluated in the pain clinic and is pending evaluation in physical therapy.    REVIEW OF SYSTEMS:  CONSTITUTIONAL:  No fever, chills, night sweats, fatigue or malaise, or weight change.  HEAD: No sinus pain.  EYES:  No visual problems, eye itching or pain, no diplopia or red eyes.  ENT:  No earache, nasal discharge, or sore throat. No tinnitus, hearing loss, or jaw claudication.  NECK: No neck pain and no swollen glands.

## 2017-02-01 NOTE — Progress Notes
topiramate (TOPAMAX) 100 MG Tablet Take 1 tab at bedtime 07/30/16   Vicie Mutters, MD   traZODone (DESYREL) 50 MG Tablet 2 PO NIGHTLY 01/16/16   Information, Historical   V-GO 41 XX Kit USE 1 KIT MONTHLY 01/24/17   Information, Historical          EXAMINATION:    GENERAL:  Well-developed, well-nourished normomorphic morphic endomorphic  female in no acute distress. Neatly dressed and groomed. Affect: euthymic blunted flat  labile.    VITAL SIGNS:   Vitals:    02/01/17 1005   BP: 127/57   Pulse: 73   Resp: 16   Temp: 36.3 ?C (97.4 ?F)   Weight: 79.4 kg (175 lb)   Height: 1.651 m (5' 5")     B/P, HR, RR, temp normal    SCALP: No tenderness to palpation.  NECK: No carotid bruits, no lymphadenopathy, neck supple with full AROM; no TTP.  PERIPHERAL VASCULAR EXAMINATION:  Peripheral vascular examination demonstrates normal arterial filling in both upper extremities and both lower extremities.    MENTAL STATUS:  The patient is alert, oriented x 4, attentive to interview without perseveration, distractibility, or impersistence. Memory: 1 Trial(s) to register 5 items;  5/5 items recalled at 10 minutes.   Good Fund of knowledge    Language: The patient follows commands appropriately; speech is fluent with good speed and phrase length. Comprehension: intact to simple and complex phrases       CRANIAL NERVES:   II: Pupils equal, round, reactive to light and accommodation; no afferent pupillary defect; VFs full to confrontation. Fundi: discs are sharp without evidence of papilledema, venous pulsations are present, no optic atrophy or hemorrhage.  III, IV, VI: Ductions/Versions full, convergence normal, saccades are rapid and accurate horizontally and vertically. Horizontal pursuits are smooth. No nystagmus is present in primary position or any direction of gaze. No ptosis is present.  V: Muscles of mastication are intact.   VII: Face is symmetric with normal strength.

## 2017-02-01 NOTE — Progress Notes
VIII: Hearing is grossly intact to finger rub bilaterally  IX, X: The palate rises symmetrically; speech is without hypophonia, dysphonia, dysarthria, or hoarseness.  XI:  Sternocleidomastoid and trapezius are 5/5 without atrophy bilaterally  XII: The tongue is midline with normal strength without atrophy    ASSESSMENT:      1) Chronic daily headache          PLAN:     1) Change Topamax back to 100 mg daily at bedtime       2)  Obtain MRI, brain with gad in light of apparent change in headache character    3)  BMP    4)  Follow-up in 6 weeks    Discussed plan of care with patient. Patient is also advised to various treatment options and agreed to the following: taking medication as directed, keeping follow up visits. Risks verses benefits of medication and treatment were discussed    Total visit time is 30 minutes, with 20 minutes face to face time counseling patient on diagnosis and treatment.    Rosealee AlbeeEdward T. Dorothyann GibbsNeely, M.D.

## 2017-03-15 ENCOUNTER — Encounter: Attending: Nurse Practitioner | Primary: Family Medicine

## 2017-03-17 DIAGNOSIS — G43719 Chronic migraine without aura, intractable, without status migrainosus: Principal | ICD-10-CM

## 2017-03-18 MED ORDER — TOPIRAMATE 100 MG PO TABS
1 refills | Status: CP
Start: 2017-03-18 — End: 2017-07-11

## 2017-04-01 ENCOUNTER — Encounter: Attending: Nurse Practitioner | Primary: Family Medicine

## 2017-07-09 DIAGNOSIS — G43719 Chronic migraine without aura, intractable, without status migrainosus: Principal | ICD-10-CM

## 2017-07-11 MED ORDER — TOPIRAMATE 100 MG PO TABS
0 refills | Status: CP
Start: 2017-07-11 — End: ?

## 2018-03-28 ENCOUNTER — Encounter: Attending: Family Medicine | Primary: Family Medicine

## 2018-04-03 ENCOUNTER — Encounter: Attending: Family Medicine | Primary: Family Medicine

## 2018-04-27 ENCOUNTER — Ambulatory Visit: Attending: Family Medicine | Primary: Family Medicine

## 2018-04-27 DIAGNOSIS — R51 Headache: Principal | ICD-10-CM

## 2018-04-27 DIAGNOSIS — R21 Rash and other nonspecific skin eruption: Secondary | ICD-10-CM

## 2018-04-27 DIAGNOSIS — R451 Restlessness and agitation: Secondary | ICD-10-CM

## 2018-04-27 DIAGNOSIS — R413 Other amnesia: Secondary | ICD-10-CM

## 2018-04-27 DIAGNOSIS — G4752 REM sleep behavior disorder: Secondary | ICD-10-CM

## 2018-04-27 DIAGNOSIS — E785 Hyperlipidemia, unspecified: Principal | ICD-10-CM

## 2018-04-27 DIAGNOSIS — Z794 Long term (current) use of insulin: Secondary | ICD-10-CM

## 2018-04-27 DIAGNOSIS — M791 Myalgia, unspecified site: Secondary | ICD-10-CM

## 2018-04-27 DIAGNOSIS — M810 Age-related osteoporosis without current pathological fracture: Secondary | ICD-10-CM

## 2018-04-27 DIAGNOSIS — J449 Chronic obstructive pulmonary disease, unspecified: Secondary | ICD-10-CM

## 2018-04-27 DIAGNOSIS — Z6826 Body mass index (BMI) 26.0-26.9, adult: Principal | ICD-10-CM

## 2018-04-27 DIAGNOSIS — F418 Other specified anxiety disorders: Secondary | ICD-10-CM

## 2018-04-27 DIAGNOSIS — Z720 Tobacco use: Secondary | ICD-10-CM

## 2018-04-27 DIAGNOSIS — E119 Type 2 diabetes mellitus without complications: Secondary | ICD-10-CM

## 2018-04-27 DIAGNOSIS — I10 Essential (primary) hypertension: Secondary | ICD-10-CM

## 2018-04-27 DIAGNOSIS — R42 Dizziness and giddiness: Secondary | ICD-10-CM

## 2018-04-27 DIAGNOSIS — G4700 Insomnia, unspecified: Secondary | ICD-10-CM

## 2018-04-27 DIAGNOSIS — G43719 Chronic migraine without aura, intractable, without status migrainosus: Secondary | ICD-10-CM

## 2018-04-27 DIAGNOSIS — R5383 Other fatigue: Secondary | ICD-10-CM

## 2018-04-27 DIAGNOSIS — Z79899 Other long term (current) drug therapy: Secondary | ICD-10-CM

## 2018-04-27 DIAGNOSIS — R2689 Other abnormalities of gait and mobility: Secondary | ICD-10-CM

## 2018-04-27 MED ORDER — LORAZEPAM 0.5 MG PO TABS
ORAL
Start: 2018-04-27 — End: 2018-04-27

## 2018-04-27 MED ORDER — CALCITONIN (SALMON) 200 UNIT/ACT NA SOLN
Freq: Every day | NASAL
Start: 2018-04-27 — End: 2018-04-27

## 2018-04-27 MED ORDER — TIZANIDINE HCL 4 MG PO TABS: Start: 2018-04-27 — End: 2018-04-27

## 2018-04-27 MED ORDER — DONEPEZIL HCL 10 MG PO TABS
0 refills | Status: CP
Start: 2018-04-27 — End: ?

## 2018-04-27 MED ORDER — ATORVASTATIN CALCIUM 10 MG PO TABS
0 refills | Status: CP
Start: 2018-04-27 — End: ?

## 2018-04-27 MED ORDER — TRAZODONE HCL 50 MG PO TABS
0 refills
Start: 2018-04-27 — End: ?

## 2018-04-27 MED ORDER — CALCITONIN (SALMON) 200 UNIT/ACT NA SOLN
0 refills
Start: 2018-04-27 — End: ?

## 2018-04-27 MED ORDER — QUETIAPINE FUMARATE 50 MG PO TABS
0 refills | Status: CP
Start: 2018-04-27 — End: 2018-04-29

## 2018-04-27 MED ORDER — VALPROIC ACID 250 MG PO CAPS
0 refills
Start: 2018-04-27 — End: 2018-04-27

## 2018-04-27 MED ORDER — ATORVASTATIN CALCIUM 10 MG PO TABS
0 refills
Start: 2018-04-27 — End: ?

## 2018-04-27 MED ORDER — LORAZEPAM 0.5 MG PO TABS
.5 mg | Freq: Every evening | ORAL | 0 refills | Status: CP
Start: 2018-04-27 — End: 2018-04-29

## 2018-04-27 MED ORDER — UMECLIDINIUM-VILANTEROL 62.5-25 MCG/INH IN AEPB
1 | RESPIRATORY_TRACT
Start: 2018-04-27 — End: 2018-04-27

## 2018-04-27 MED ORDER — QUETIAPINE FUMARATE 50 MG PO TABS
0 refills
Start: 2018-04-27 — End: ?

## 2018-04-27 MED ORDER — HUMALOG 100 UNIT/ML SC SOLN
12.7 [IU] | Freq: Once | SUBCUTANEOUS | 0 refills | Status: CP
Start: 2018-04-27 — End: ?

## 2018-04-27 MED ORDER — CLONAZEPAM 0.5 MG PO TABS
0 refills | Status: CN
Start: 2018-04-27 — End: ?

## 2018-04-27 MED ORDER — NYSTATIN 100000 UNIT/GM EX POWD
0 refills | Status: CP
Start: 2018-04-27 — End: ?

## 2018-04-27 MED ORDER — HYDROXYZINE HCL 25 MG PO TABS
25 mg | Freq: Two times a day (BID) | ORAL | 0 refills | Status: CP | PRN
Start: 2018-04-27 — End: ?

## 2018-04-27 MED ORDER — TRAZODONE HCL 50 MG PO TABS
50 mg | Freq: Two times a day (BID) | ORAL | 0 refills | Status: CP
Start: 2018-04-27 — End: ?

## 2018-04-27 MED ORDER — LINAGLIPTIN 5 MG PO TABS
5 mg | Freq: Every day | ORAL | 0 refills | Status: CP
Start: 2018-04-27 — End: ?

## 2018-04-27 MED ORDER — UMECLIDINIUM-VILANTEROL 62.5-25 MCG/INH IN AEPB
1 | Freq: Every day | RESPIRATORY_TRACT | 0 refills | Status: CP
Start: 2018-04-27 — End: ?

## 2018-04-27 MED ORDER — HYDROXYZINE HCL 25 MG PO TABS
25 mg | ORAL
Start: 2018-04-27 — End: 2018-04-27

## 2018-04-27 MED ORDER — CARVEDILOL 6.25 MG PO TABS
0 refills
Start: 2018-04-27 — End: ?

## 2018-04-27 MED ORDER — NOVOLOG 100 UNIT/ML SC SOLN
0 refills | Status: CP
Start: 2018-04-27 — End: ?

## 2018-04-27 MED ORDER — POTASSIUM CHLORIDE 20 MEQ PO PACK: Start: 2018-04-27 — End: ?

## 2018-04-27 MED ORDER — DONEPEZIL HCL 10 MG PO TABS
0 refills
Start: 2018-04-27 — End: ?

## 2018-04-27 MED ORDER — CALCITONIN (SALMON) 200 UNIT/ACT NA SOLN
1 | Freq: Every day | NASAL | 0 refills | Status: CP
Start: 2018-04-27 — End: ?

## 2018-04-27 MED ORDER — CARVEDILOL 6.25 MG PO TABS
0 refills | Status: CP
Start: 2018-04-27 — End: ?

## 2018-04-27 MED ORDER — VENLAFAXINE HCL ER 75 MG PO CP24
0 refills | Status: CP
Start: 2018-04-27 — End: ?

## 2018-04-28 DIAGNOSIS — R51 Headache: Principal | ICD-10-CM

## 2018-04-28 DIAGNOSIS — G4752 REM sleep behavior disorder: Principal | ICD-10-CM

## 2018-04-28 DIAGNOSIS — R5383 Other fatigue: Secondary | ICD-10-CM

## 2018-04-28 DIAGNOSIS — E119 Type 2 diabetes mellitus without complications: Principal | ICD-10-CM

## 2018-04-28 DIAGNOSIS — M791 Myalgia, unspecified site: Secondary | ICD-10-CM

## 2018-04-28 DIAGNOSIS — R451 Restlessness and agitation: Secondary | ICD-10-CM

## 2018-04-28 DIAGNOSIS — G4700 Insomnia, unspecified: Secondary | ICD-10-CM

## 2018-04-28 DIAGNOSIS — R2689 Other abnormalities of gait and mobility: Secondary | ICD-10-CM

## 2018-04-28 DIAGNOSIS — R42 Dizziness and giddiness: Secondary | ICD-10-CM

## 2018-04-28 MED ORDER — INSULIN GLARGINE 100 UNIT/ML SC SOLN
13 [IU] | Freq: Every evening | SUBCUTANEOUS | 0 refills | Status: CP
Start: 2018-04-28 — End: ?

## 2018-04-28 MED ORDER — BD PEN NEEDLE NANO U/F 32G X 4 MM MISC
5 refills | Status: CP
Start: 2018-04-28 — End: ?

## 2018-04-28 MED ORDER — QUETIAPINE FUMARATE 50 MG PO TABS
0 refills | Status: CP
Start: 2018-04-28 — End: ?

## 2018-04-28 MED ORDER — LORAZEPAM 0.5 MG PO TABS
.5 mg | Freq: Two times a day (BID) | ORAL | 0 refills | Status: CP
Start: 2018-04-28 — End: 2018-05-01

## 2018-04-30 DIAGNOSIS — R451 Restlessness and agitation: Secondary | ICD-10-CM

## 2018-04-30 DIAGNOSIS — G4700 Insomnia, unspecified: Principal | ICD-10-CM

## 2018-04-30 DIAGNOSIS — G4752 REM sleep behavior disorder: Secondary | ICD-10-CM

## 2018-05-01 DIAGNOSIS — G4700 Insomnia, unspecified: Secondary | ICD-10-CM

## 2018-05-01 DIAGNOSIS — R451 Restlessness and agitation: Secondary | ICD-10-CM

## 2018-05-01 DIAGNOSIS — G4752 REM sleep behavior disorder: Principal | ICD-10-CM

## 2018-05-01 MED ORDER — QUETIAPINE FUMARATE 50 MG PO TABS
0 refills
Start: 2018-05-01 — End: ?

## 2018-05-01 MED ORDER — LORAZEPAM 0.5 MG PO TABS
.5 mg | Freq: Two times a day (BID) | ORAL | 0 refills | Status: CP
Start: 2018-05-01 — End: ?

## 2018-05-03 DIAGNOSIS — R51 Headache: Principal | ICD-10-CM

## 2018-05-03 DIAGNOSIS — R42 Dizziness and giddiness: Secondary | ICD-10-CM

## 2018-05-03 DIAGNOSIS — F0390 Unspecified dementia without behavioral disturbance: Secondary | ICD-10-CM

## 2018-05-03 DIAGNOSIS — R5383 Other fatigue: Secondary | ICD-10-CM

## 2018-05-03 DIAGNOSIS — M791 Myalgia, unspecified site: Secondary | ICD-10-CM

## 2018-05-03 DIAGNOSIS — R2689 Other abnormalities of gait and mobility: Secondary | ICD-10-CM

## 2018-05-23 ENCOUNTER — Encounter: Attending: Family Medicine | Primary: Family Medicine

## 2018-05-24 ENCOUNTER — Encounter: Attending: Neurology | Primary: Family Medicine

## 2018-06-27 ENCOUNTER — Ambulatory Visit: Admit: 2018-06-27 | Discharge: 2018-06-27 | Attending: Neurology | Primary: Family Medicine

## 2018-06-27 DIAGNOSIS — R5383 Other fatigue: Secondary | ICD-10-CM

## 2018-06-27 DIAGNOSIS — R42 Dizziness and giddiness: Secondary | ICD-10-CM

## 2018-06-27 DIAGNOSIS — M791 Myalgia, unspecified site: Secondary | ICD-10-CM

## 2018-06-27 DIAGNOSIS — R208 Other disturbances of skin sensation: Principal | ICD-10-CM

## 2018-06-27 DIAGNOSIS — R2689 Other abnormalities of gait and mobility: Secondary | ICD-10-CM

## 2018-06-27 DIAGNOSIS — F0390 Unspecified dementia without behavioral disturbance: Secondary | ICD-10-CM

## 2018-06-27 DIAGNOSIS — F418 Other specified anxiety disorders: Secondary | ICD-10-CM

## 2018-06-27 DIAGNOSIS — Z79899 Other long term (current) drug therapy: Secondary | ICD-10-CM

## 2018-06-27 DIAGNOSIS — R413 Other amnesia: Secondary | ICD-10-CM

## 2018-06-27 DIAGNOSIS — G609 Hereditary and idiopathic neuropathy, unspecified: Secondary | ICD-10-CM

## 2018-06-27 DIAGNOSIS — R51 Headache: Principal | ICD-10-CM

## 2018-06-27 DIAGNOSIS — E119 Type 2 diabetes mellitus without complications: Secondary | ICD-10-CM

## 2018-06-27 DIAGNOSIS — G4752 REM sleep behavior disorder: Secondary | ICD-10-CM

## 2018-06-27 DIAGNOSIS — Z87891 Personal history of nicotine dependence: Secondary | ICD-10-CM

## 2018-07-05 ENCOUNTER — Inpatient Hospital Stay: Admit: 2018-07-05 | Discharge: 2018-07-05 | Attending: Neurology | Primary: Family Medicine

## 2018-07-05 DIAGNOSIS — R208 Other disturbances of skin sensation: Secondary | ICD-10-CM

## 2018-07-05 DIAGNOSIS — E119 Type 2 diabetes mellitus without complications: Secondary | ICD-10-CM

## 2018-07-05 DIAGNOSIS — G5622 Lesion of ulnar nerve, left upper limb: Secondary | ICD-10-CM

## 2018-07-05 DIAGNOSIS — G5612 Other lesions of median nerve, left upper limb: Secondary | ICD-10-CM

## 2018-07-05 DIAGNOSIS — G609 Hereditary and idiopathic neuropathy, unspecified: Principal | ICD-10-CM

## 2018-07-05 DIAGNOSIS — R209 Unspecified disturbances of skin sensation: Secondary | ICD-10-CM

## 2018-10-24 ENCOUNTER — Ambulatory Visit: Payer: Self-pay | Admitting: Adult Health

## 2018-10-24 DIAGNOSIS — Z0289 Encounter for other administrative examinations: Secondary | ICD-10-CM

## 2018-10-24 NOTE — Progress Notes (Deleted)
Patient presents to clinic today to establish care. 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   Acute Concerns:   Chronic Issues:   Health Maintenance: Dental -- Vision -- Immunizations -- Colonoscopy -- Mammogram -- PAP --  Bone Density --    Past Medical History:  Diagnosis Date  . ANXIETY   . Carpal tunnel syndrome of right wrist 01/2013  . Chronic diastolic heart failure   . COPD (chronic obstructive pulmonary disease)    on home O2 at night, 2l/min.; denies SOB with daily activities  . DEPRESSION   . DIABETES MELLITUS, TYPE II 2001   IDDM  . Diabetic neuropathy    feet  . Diverticulosis   . GERD   . History of substance abuse    Etoh and narcotics  . Hyperlipidemia   . HYPERTENSION    hx. of; no longer on med. due to orthostatic hypotension  . Multiple falls   . Orthostatic hypotension 05/17/12  . 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 SEIZURE DISORDER    no seizures in 3 yrs.  . Sleep apnea 05/26/2012   sleep study:  AHI 7.5; RDI 10.7; CPAP not recommended  . Ulnocarpal abutment syndrome 01/2013   right  . Wears dentures    upper    Past Surgical History:  Procedure Laterality Date  . APPENDECTOMY  1970's  . CARPAL TUNNEL RELEASE Right 02/21/2013   Procedure: CARPAL TUNNEL RELEASE;  Surgeon: Nicki Reaper, MD;  Location: [facility];  Service: Orthopedics;  Laterality: Right;  . CATARACT EXTRACTION W/ INTRAOCULAR LENS  IMPLANT,  BILATERAL Bilateral 2012  . CHOLECYSTECTOMY  10/26/2000   lap. chole.  Marland Kitchen HARDWARE REMOVAL Right 02/21/2013   Procedure: REMOVAL DVR PLATE;  Surgeon: Nicki Reaper, MD;  Location: [facility];  Service: Orthopedics;  Laterality: Right;  . HEMORRHOID SURGERY    . SHOULDER ARTHROSCOPY W/ SUBACROMIAL DECOMPRESSION AND DISTAL CLAVICLE EXCISION Left 12/28/2011  . VAGINAL HYSTERECTOMY    . WRIST FRACTURE SURGERY Right 02/2012  . WRIST OSTEOTOMY Right 02/21/2013   Procedure: OSTEOTOMY ULNAR HEAD;  Surgeon: Nicki Reaper, MD;  Location: [facility];  Service: Orthopedics;  Laterality: Right;    Current Outpatient Medications on File Prior to Visit  Medication Sig Dispense Refill  . acetaminophen (TYLENOL) 325 MG tablet Take 650 mg by mouth every 6 (six) hours as needed for pain.    Marland Kitchen albuterol (PROVENTIL) (2.5 MG/3ML) 0.083% nebulizer solution Take 2.5 mg by nebulization every 6 (six) hours as needed for wheezing.    Marland Kitchen aspirin 81 MG tablet Take 81 mg by mouth as needed for pain.    Marland Kitchen atorvastatin (LIPITOR) 10 MG tablet Take 1 tablet (10 mg total) by mouth daily.    . calcium-vitamin D (OSCAL) 250-125 MG-UNIT per tablet Take 1 tablet by mouth daily.    . clonazePAM (KLONOPIN) 0.5 MG tablet Take 1 tablet (0.5 mg total) by mouth at bedtime. ONLY IF NEEDED 90 tablet 0  . erythromycin ophthalmic ointment Place a 1/2 inch ribbon of ointment into the lower eyelid 4x daily  for 1 week 3.5 g 0  . estrogens, conjugated, (PREMARIN) 0.625 MG tablet Take 1 tablet (0.625 mg total) by mouth daily.    . insulin glargine (LANTUS) 100 UNIT/ML injection Inject 0.26 mLs (26 Units total) into the skin at bedtime. 10 mL 12  . ipratropium (ATROVENT) 0.02 % nebulizer solution Take 2.5 mLs (500 mcg total) by nebulization 4 (four) times daily. 75 mL 0  . levETIRAcetam (KEPPRA XR) 500 MG 24 hr tablet Take 1 tablet (500 mg total) by mouth 2 (two) times daily. 540 tablet 1  . Liraglutide 18 MG/3ML SOPN Inject  1.8 mg into the skin daily.    . Magnesium 250 MG TABS Take 1 tablet (250 mg total) by mouth daily with breakfast.  0  . metFORMIN (GLUCOPHAGE-XR) 500 MG 24 hr tablet Take 3 tablets (1,500 mg total) by mouth daily at 12 noon.    . Multiple Vitamins-Minerals (CENTRUM SILVER) tablet Take 1 tablet by mouth daily.    . nicotine (NICODERM CQ - DOSED IN MG/24 HOURS) 14 mg/24hr patch Place 1 patch onto the skin daily. 28 patch 0  . omeprazole (PRILOSEC) 20 MG capsule Take 1 capsule (20 mg total) by mouth daily.    . predniSONE (DELTASONE) 50 MG tablet Take 1 tablet (50 mg total) by mouth daily. 5 tablet 0  . pregabalin (LYRICA) 300 MG capsule Take 2 capsules (600 mg total) by mouth at bedtime. 180 capsule 1  . pyridostigmine (MESTINON) 60 MG tablet Take 60 mg by mouth daily.    . QUEtiapine (SEROQUEL) 300 MG tablet Take 1 tablet (300 mg total) by mouth at bedtime. 30 tablet 0  . venlafaxine XR (EFFEXOR-XR) 150 MG 24 hr capsule Take 1 capsule (150 mg total) by mouth daily with breakfast. 30 capsule 0  . Vitamins C E (CRANBERRY CONCENTRATE PO) Take 4,200 mg by mouth daily.     No current facility-administered medications on file prior to visit.     Allergies  Allergen Reactions  . Penicillins Swelling    OF LIPS  . Tegretol [Carbamazepine] Other (See Comments)    HYPONATREMIA    Family History  Problem Relation Age of Onset  . Coronary artery disease Father     Social History   Socioeconomic History  . Marital status: Married    Spouse name: Not on file  . Number of children: Not on file  . Years of education: Not on file  . Highest education level: Not on file  Occupational History  . Occupation: Engineer, civil (consulting)    Comment: Retired  Engineer, production  . Financial resource strain: Not on file  . Food insecurity:    Worry: Not on file    Inability: Not on file  . Transportation needs:    Medical: Not on file    Non-medical: Not on file  Tobacco Use  . Smoking status: Current Every Day Smoker      Packs/day: 1.00    Years: 30.00    Pack years: 30.00    Types: Cigarettes  . Smokeless tobacco: Never Used  . Tobacco comment: 3 cig./day  Substance and Sexual Activity  . Alcohol use: No    Comment: former hx. of Etoh abuse  . Drug use: No    Comment: former hx. of narcotic abuse  . Sexual activity: Not Currently  Lifestyle  . Physical activity:    Days per week: Not on file    Minutes per session: Not on file  . Stress: Not on file  Relationships  . Social connections:    Talks on phone: Not on file    Gets together: Not on file    Attends religious service: Not on file    Active member of club or organization: Not on file    Attends meetings of clubs or organizations: Not on file    Relationship status: Not on file  . Intimate partner violence:    Fear of current or ex partner: Not on file    Emotionally abused: Not on file    Physically abused: Not on file    Forced sexual activity: Not on file  Other Topics Concern  . Not on file  Social History Narrative  . Not on file    ROS  There were no vitals taken for this visit.  Physical Exam  No results found for this or any previous visit (from the past 2160 hour(s)).  Assessment/Plan: No problem-specific Assessment & Plan notes found for this encounter.

## 2019-12-15 ENCOUNTER — Ambulatory Visit: Admit: 2019-12-15 | Discharge: 2019-12-16 | Payer: Medicare Other | Primary: Family Medicine

## 2019-12-15 DIAGNOSIS — Z23 Encounter for immunization: Principal | ICD-10-CM

## 2020-01-05 ENCOUNTER — Encounter: Payer: Medicare Other | Primary: Family Medicine

## 2020-01-05 ENCOUNTER — Ambulatory Visit: Admit: 2020-01-05 | Payer: Medicare Other | Primary: Family Medicine

## 2020-01-05 DIAGNOSIS — Z23 Encounter for immunization: Principal | ICD-10-CM

## 2021-04-09 ENCOUNTER — Emergency Department: Admit: 2021-04-09 | Discharge: 2021-04-09 | Payer: Medicare Other

## 2021-04-09 ENCOUNTER — Inpatient Hospital Stay: Admit: 2021-04-09 | Discharge: 2021-04-09 | Payer: Medicare Other

## 2021-04-09 DIAGNOSIS — N390 Urinary tract infection, site not specified: Secondary | ICD-10-CM

## 2021-04-09 DIAGNOSIS — R42 Dizziness and giddiness: Secondary | ICD-10-CM

## 2021-04-09 DIAGNOSIS — F0390 Unspecified dementia without behavioral disturbance: Secondary | ICD-10-CM

## 2021-04-09 DIAGNOSIS — R5383 Other fatigue: Secondary | ICD-10-CM

## 2021-04-09 DIAGNOSIS — R2689 Other abnormalities of gait and mobility: Secondary | ICD-10-CM

## 2021-04-09 DIAGNOSIS — R413 Other amnesia: Principal | ICD-10-CM

## 2021-04-09 DIAGNOSIS — R519 Headache: Principal | ICD-10-CM

## 2021-04-09 DIAGNOSIS — M791 Myalgia, unspecified site: Secondary | ICD-10-CM

## 2021-04-09 MED ORDER — NITROFURANTOIN MONOHYD MACRO 100 MG PO CAPS
100 mg | Freq: Once | ORAL | Status: DC
Start: 2021-04-09 — End: 2021-04-10

## 2021-04-09 MED ORDER — NITROFURANTOIN MONOHYD MACRO 100 MG PO CAPS
100 mg | Freq: Two times a day (BID) | ORAL | 0 refills | Status: CP
Start: 2021-04-09 — End: ?

## 2021-04-09 MED ORDER — NITROFURANTOIN MONOHYD MACRO 100 MG PO CAPS
100 mg | Freq: Two times a day (BID) | ORAL | Status: DC
Start: 2021-04-09 — End: 2021-04-09

## 2021-04-09 MED ORDER — HALOPERIDOL LACTATE 5 MG/ML IJ SOLN
2.5 mg | Freq: Once | INTRAMUSCULAR | Status: CP
Start: 2021-04-09 — End: ?

## 2021-04-09 MED ORDER — BOLUS IV FLUID JX
Freq: Once | INTRAVENOUS | Status: CP
Start: 2021-04-09 — End: ?

## 2021-04-13 MED ORDER — SULFAMETHOXAZOLE-TRIMETHOPRIM 800-160 MG PO TABS
1 | ORAL_TABLET | Freq: Two times a day (BID) | ORAL | 0 refills | Status: CP
Start: 2021-04-13 — End: ?

## 2021-05-13 ENCOUNTER — Inpatient Hospital Stay: Admit: 2021-05-13 | Payer: Medicare Other

## 2021-05-13 ENCOUNTER — Emergency Department: Admit: 2021-05-13 | Payer: Medicare Other

## 2021-05-13 DIAGNOSIS — R739 Hyperglycemia, unspecified: Secondary | ICD-10-CM

## 2021-05-13 DIAGNOSIS — J189 Pneumonia, unspecified organism: Secondary | ICD-10-CM

## 2021-05-13 DIAGNOSIS — E870 Hyperosmolality and hypernatremia: Secondary | ICD-10-CM

## 2021-05-13 DIAGNOSIS — R509 Fever, unspecified: Secondary | ICD-10-CM

## 2021-05-13 DIAGNOSIS — R0902 Hypoxemia: Secondary | ICD-10-CM

## 2021-05-13 DIAGNOSIS — N179 Acute kidney failure, unspecified: Secondary | ICD-10-CM

## 2021-05-13 DIAGNOSIS — R4182 Altered mental status, unspecified: Principal | ICD-10-CM

## 2021-05-13 DIAGNOSIS — A419 Sepsis, unspecified organism: Secondary | ICD-10-CM

## 2021-05-13 MED ORDER — BOLUS IV FLUID JX
Freq: Once | INTRAVENOUS | Status: CP
Start: 2021-05-13 — End: ?

## 2021-05-13 MED ORDER — HEPARIN SODIUM (PORCINE) 5000 UNIT/ML IJ SOLN
5000 [IU] | Freq: Three times a day (TID) | SUBCUTANEOUS | Status: CP
Start: 2021-05-13 — End: ?

## 2021-05-13 MED ORDER — VANCOMYCIN HCL 1000 MG IV SOLR MBP/V2B KIT JX
1000 mg | INTRAVENOUS | Status: CP
Start: 2021-05-13 — End: ?

## 2021-05-13 MED ORDER — PHARMACY CONSULT-VANCOMYCIN JX
Status: CP
Start: 2021-05-13 — End: ?

## 2021-05-13 MED ORDER — SODIUM CHLORIDE FLUSH 0.9 % IV SOLN
5 mL | Freq: Three times a day (TID) | INTRAVENOUS | Status: CP
Start: 2021-05-13 — End: ?

## 2021-05-13 MED ORDER — ACETAMINOPHEN 325 MG PO TABS
650 mg | ORAL | Status: DC | PRN
Start: 2021-05-13 — End: 2021-05-14

## 2021-05-13 MED ORDER — SODIUM CHLORIDE FLUSH 0.9 % IV SOLN
5 mL | INTRAVENOUS | Status: CP | PRN
Start: 2021-05-13 — End: ?

## 2021-05-13 MED ORDER — KETOROLAC TROMETHAMINE 15 MG/ML IJ SOLN
15 mg | Freq: Once | INTRAVENOUS | Status: DC
Start: 2021-05-13 — End: 2021-05-14

## 2021-05-13 MED ORDER — CEFEPIME 2 G EXTENDED INFUSION MBP JX
2 g | Freq: Two times a day (BID) | INTRAVENOUS | Status: CP
Start: 2021-05-13 — End: ?

## 2021-05-13 MED ORDER — CEFEPIME 2 G IV MBP
2 g | Freq: Once | INTRAVENOUS | Status: CP
Start: 2021-05-13 — End: ?

## 2021-05-13 MED ORDER — SODIUM BICARBONATE 8.4 % IV SOLN
50 meq | Freq: Once | INTRAVENOUS | Status: CP
Start: 2021-05-13 — End: ?

## 2021-05-13 MED ORDER — MUPIROCIN 2 % EX OINT
Freq: Two times a day (BID) | NASAL | Status: CP
Start: 2021-05-13 — End: ?

## 2021-05-13 MED ORDER — FENTANYL CITRATE INJ 50 MCG/ML CUSTOM AMP/VIAL
50 ug | Freq: Once | INTRAVENOUS | Status: CP
Start: 2021-05-13 — End: ?

## 2021-05-13 MED ORDER — VANCOMYCIN HCL 1000 MG IV SOLR MBP/V2B KIT JX
20 mg/kg | Freq: Once | INTRAVENOUS | Status: CP
Start: 2021-05-13 — End: ?

## 2021-05-13 MED ORDER — KETOROLAC TROMETHAMINE 15 MG/ML IJ SOLN
15 mg | Freq: Once | INTRAVENOUS | Status: CP
Start: 2021-05-13 — End: ?

## 2021-05-13 MED ORDER — NOREPINEPHRINE 8 MG/250 ML IV SOLN CUSTOM JX
0-30 ug/min | INTRAVENOUS | Status: CP
Start: 2021-05-13 — End: ?

## 2021-05-13 MED ORDER — INSULIN ASPART 100 UNIT/ML IJ SOLN
8 [IU] | Freq: Once | SUBCUTANEOUS | Status: CP
Start: 2021-05-13 — End: ?

## 2021-05-13 MED ORDER — ACETAMINOPHEN 650 MG RE SUPP
650 mg | Freq: Once | RECTAL | Status: CP
Start: 2021-05-13 — End: ?

## 2021-05-13 MED ORDER — VASOPRESSIN INFUSION JX
0.03 [IU]/min | INTRAVENOUS | Status: DC
Start: 2021-05-13 — End: 2021-05-14

## 2021-05-13 MED ORDER — MIDAZOLAM HCL 2 MG/2ML IJ SOLN
1 mg | Freq: Once | INTRAVENOUS | Status: DC
Start: 2021-05-13 — End: 2021-05-14

## 2021-05-14 MED ORDER — INSULIN ASPART 100 UNIT/ML IJ SOLN
0-10 [IU] | Freq: Four times a day (QID) | SUBCUTANEOUS | Status: DC
Start: 2021-05-14 — End: 2021-05-15

## 2021-05-14 MED ORDER — BOLUS IV FLUID JX
Freq: Once | INTRAVENOUS | Status: CP
Start: 2021-05-14 — End: ?

## 2021-05-14 MED ORDER — NUTRITION ADULT - FLUSH SCHEDULED JX
NASOGASTRIC | Status: CP
Start: 2021-05-14 — End: ?

## 2021-05-14 MED ORDER — GLUCOSE 4 G PO CHEW JX
16 g | ORAL | Status: CP | PRN
Start: 2021-05-14 — End: ?

## 2021-05-14 MED ORDER — DEXTROSE 50 % IV SOLN
50 mL | INTRAVENOUS | Status: CP | PRN
Start: 2021-05-14 — End: ?

## 2021-05-14 MED ORDER — INSULIN REGULAR HUMAN 100 UNIT/ML IJ SOLN
0-5 [IU] | Freq: Three times a day (TID) | SUBCUTANEOUS | Status: DC
Start: 2021-05-14 — End: 2021-05-14

## 2021-05-14 MED ORDER — PERFLUTREN LIPID MICROSPHERE 6.52 MG/ML IV SUSP
.2-1.3 mL | INTRAVENOUS | Status: CP | PRN
Start: 2021-05-14 — End: ?

## 2021-05-14 MED ORDER — SENNOSIDES-DOCUSATE SODIUM 8.6-50 MG PO TABS
1 | ORAL_TABLET | Freq: Every evening | ORAL | Status: CP
Start: 2021-05-14 — End: ?

## 2021-05-14 MED ORDER — POLYETHYLENE GLYCOL 3350 17 G PO PACK
17 g | Freq: Every day | ORAL | Status: CP
Start: 2021-05-14 — End: ?

## 2021-05-14 MED ORDER — NUTRITION ADULT - TUBE FEED CONTINUOUS JX
NASOGASTRIC | Status: CP
Start: 2021-05-14 — End: ?

## 2021-05-14 MED ORDER — NUTRITION ADULT - FLUSH SCHEDULED JX
NASOGASTRIC | Status: DC
Start: 2021-05-14 — End: 2021-05-15

## 2021-05-14 MED ORDER — BOLUS IV FLUID JX
Freq: Once | INTRAVENOUS | Status: DC
Start: 2021-05-14 — End: 2021-05-15

## 2021-05-14 MED ORDER — ORAL CARBOHYDRATE FOR HYPOGLYCEMIA JX
4-8 [oz_av] | ORAL | Status: CP | PRN
Start: 2021-05-14 — End: ?

## 2021-05-14 MED ORDER — INSULIN ASPART 100 UNIT/ML IJ SOLN
0-10 [IU] | Freq: Three times a day (TID) | SUBCUTANEOUS | Status: DC
Start: 2021-05-14 — End: 2021-05-15

## 2021-05-14 MED ORDER — INSULIN ASPART 100 UNIT/ML IJ SOLN
0-10 [IU] | SUBCUTANEOUS | Status: CP
Start: 2021-05-14 — End: ?

## 2021-05-15 MED ORDER — CEFEPIME 2 G IV MBP
2 g | Freq: Once | INTRAVENOUS | Status: CP
Start: 2021-05-15 — End: ?

## 2021-05-15 MED ORDER — NUTRITION ADULT - TUBE FEED CONTINUOUS JX
NASOGASTRIC | Status: CP
Start: 2021-05-15 — End: ?

## 2021-05-15 MED ORDER — CEFEPIME 2 G EXTENDED INFUSION MBP JX
2 g | Freq: Three times a day (TID) | INTRAVENOUS | Status: CP
Start: 2021-05-15 — End: ?

## 2021-05-15 MED ORDER — POTASSIUM PHOSPHATE 60 MMOL IVPB JX
45 mmol | Freq: Once | INTRAVENOUS | Status: CP
Start: 2021-05-15 — End: ?

## 2021-05-15 MED ORDER — VANCOMYCIN HCL 1500 MG IV SOLR MBP KIT JX
1500 mg | INTRAVENOUS | Status: CP
Start: 2021-05-15 — End: ?

## 2021-05-15 MED ORDER — INSULIN GLARGINE 100 UNIT/ML SC SOLN
5 [IU] | Freq: Every evening | SUBCUTANEOUS | Status: CP
Start: 2021-05-15 — End: ?

## 2021-05-16 MED ORDER — CEFEPIME 2 G EXTENDED INFUSION MBP JX
2 g | Freq: Two times a day (BID) | INTRAVENOUS | Status: CP
Start: 2021-05-16 — End: ?

## 2021-05-16 MED ORDER — POTASSIUM CHLORIDE 20 MEQ/15ML (10%) PO SOLN
40 meq | Freq: Once | ORAL | Status: CP
Start: 2021-05-16 — End: ?

## 2021-05-16 MED ORDER — QUETIAPINE FUMARATE 25 MG PO TABS
50 mg | Freq: Two times a day (BID) | ORAL | Status: CP
Start: 2021-05-16 — End: ?

## 2021-05-16 MED ORDER — INSULIN NPH (HUMAN) (ISOPHANE) 100 UNIT/ML SC SUSP
7 [IU] | Freq: Two times a day (BID) | SUBCUTANEOUS | Status: CP
Start: 2021-05-16 — End: ?

## 2021-05-17 MED ORDER — INSULIN NPH (HUMAN) (ISOPHANE) 100 UNIT/ML SC SUSP
5 [IU] | Freq: Two times a day (BID) | SUBCUTANEOUS | Status: DC
Start: 2021-05-17 — End: 2021-05-18

## 2021-05-17 MED ORDER — INSULIN NPH (HUMAN) (ISOPHANE) 100 UNIT/ML SC SUSP
7 [IU] | Freq: Two times a day (BID) | SUBCUTANEOUS | Status: CP
Start: 2021-05-17 — End: ?

## 2021-05-17 MED ORDER — QUETIAPINE FUMARATE 25 MG PO TABS
100 mg | Freq: Two times a day (BID) | ORAL | Status: CP
Start: 2021-05-17 — End: ?

## 2021-05-17 MED ORDER — VANCOMYCIN HCL 1000 MG IV SOLR MBP/V2B KIT JX
1000 mg | Freq: Two times a day (BID) | INTRAVENOUS | Status: CP
Start: 2021-05-17 — End: ?

## 2021-05-17 MED ORDER — POTASSIUM PHOSPHATE IVPB (PERIPHERAL LINE) JX
30 mmol | Freq: Once | INTRAVENOUS | Status: CP
Start: 2021-05-17 — End: ?

## 2021-05-17 MED ORDER — SODIUM CHLORIDE 0.45 % IV SOLN
INTRAVENOUS | Status: CP
Start: 2021-05-17 — End: ?

## 2021-05-17 MED ORDER — BOLUS IV FLUID JX
Freq: Once | INTRAVENOUS | Status: CP
Start: 2021-05-17 — End: ?

## 2021-05-18 MED ORDER — MIDODRINE HCL 2.5 MG PO TABS
2.5 mg | Freq: Three times a day (TID) | ORAL | Status: CP
Start: 2021-05-18 — End: ?

## 2021-05-18 MED ORDER — NUTRITION ADULT - ORAL SUPPLEMENTS JX
Freq: Two times a day (BID) | ORAL | Status: CP
Start: 2021-05-18 — End: ?

## 2021-05-18 MED ORDER — NUTRITION ADULT - ORAL SUPPLEMENTS JX
Freq: Every day | ORAL | Status: CP
Start: 2021-05-18 — End: ?

## 2021-05-18 MED ORDER — VALPROIC ACID 250 MG/5ML PO SYRP UD
250 mg | Freq: Two times a day (BID) | ORAL | Status: CP
Start: 2021-05-18 — End: ?

## 2021-05-18 MED ORDER — INSULIN NPH (HUMAN) (ISOPHANE) 100 UNIT/ML SC SUSP
4 [IU] | Freq: Two times a day (BID) | SUBCUTANEOUS | Status: CP
Start: 2021-05-18 — End: ?

## 2021-05-19 MED ORDER — METRONIDAZOLE 500 MG PO TABS
500 mg | Freq: Two times a day (BID) | ORAL | Status: CP
Start: 2021-05-19 — End: ?

## 2021-05-19 MED ORDER — QUETIAPINE FUMARATE 25 MG PO TABS
50 mg | Freq: Two times a day (BID) | ORAL | Status: CP
Start: 2021-05-19 — End: ?

## 2021-05-19 MED ORDER — VALPROIC ACID 250 MG/5ML PO SYRP UD
500 mg | Freq: Two times a day (BID) | ORAL | Status: CP
Start: 2021-05-19 — End: ?

## 2021-05-19 MED ORDER — CEFEPIME 1 G EXTENDED INFUSION MBP JX
1 g | Freq: Two times a day (BID) | INTRAVENOUS | Status: CP
Start: 2021-05-19 — End: ?

## 2021-05-19 MED ORDER — CHOLESTYRAMINE LIGHT 4 G PO PACK
1 | PACK | Freq: Two times a day (BID) | ORAL | Status: CP
Start: 2021-05-19 — End: ?

## 2021-05-19 MED ORDER — HALOPERIDOL LACTATE 5 MG/ML IJ SOLN
2 mg | Freq: Once | INTRAMUSCULAR | Status: CP
Start: 2021-05-19 — End: ?

## 2021-05-19 MED ORDER — ATORVASTATIN CALCIUM 10 MG PO TABS
10 mg | Freq: Every evening | ORAL | Status: CP
Start: 2021-05-19 — End: ?

## 2021-05-19 MED ORDER — CEFEPIME 2 G IV MBP
2 g | Freq: Once | INTRAVENOUS | Status: CP
Start: 2021-05-19 — End: ?

## 2021-05-19 MED ORDER — LINEZOLID 600 MG PO TABS
600 mg | Freq: Two times a day (BID) | ORAL | Status: CP
Start: 2021-05-19 — End: ?

## 2021-05-19 MED ORDER — ZZ IMS TEMPLATE
150 mg | Freq: Every day | ORAL | Status: CP
Start: 2021-05-19 — End: ?

## 2021-05-20 MED ORDER — LEVEMIR FLEXTOUCH 100 UNIT/ML SC SOPN
10 [IU] | Freq: Every day | SUBCUTANEOUS | Status: SS
Start: 2021-05-20 — End: ?

## 2021-05-20 MED ORDER — ALBUTEROL SULFATE HFA 108 (90 BASE) MCG/ACT IN AERS
2 | RESPIRATORY_TRACT | Status: SS
Start: 2021-05-20 — End: ?

## 2021-05-20 MED ORDER — CARVEDILOL 6.25 MG PO TABS
1 | Freq: Two times a day (BID) | ORAL | Status: SS
Start: 2021-05-20 — End: ?

## 2021-05-20 MED ORDER — DONEPEZIL HCL 10 MG PO TABS
1 | Freq: Every evening | ORAL | Status: SS
Start: 2021-05-20 — End: ?

## 2021-05-20 MED ORDER — CHOLESTYRAMINE 4 G PO PACK
4 g | PACK | Freq: Every day | ORAL | Status: SS
Start: 2021-05-20 — End: ?

## 2021-05-20 MED ORDER — QUETIAPINE FUMARATE 50 MG PO TABS
Status: SS
Start: 2021-05-20 — End: ?

## 2021-05-20 MED ORDER — ESTRADIOL 1 MG PO TABS
1 mg | Freq: Every day | ORAL | Status: SS
Start: 2021-05-20 — End: ?

## 2021-05-20 MED ORDER — ATORVASTATIN CALCIUM 10 MG PO TABS
1 | Freq: Every day | ORAL | Status: SS
Start: 2021-05-20 — End: 2021-05-20

## 2021-05-20 MED ORDER — ALLOPURINOL 100 MG PO TABS
1.5 | Freq: Every day | ORAL | Status: SS
Start: 2021-05-20 — End: ?

## 2021-05-20 MED ORDER — MEGESTROL ACETATE 40 MG/ML PO SUSP
10 mL | Freq: Every day | ORAL | Status: SS
Start: 2021-05-20 — End: ?

## 2021-05-20 MED ORDER — DIVALPROEX SODIUM 250 MG PO TBEC
2 | Freq: Two times a day (BID) | ORAL | Status: SS
Start: 2021-05-20 — End: ?

## 2021-05-20 MED ORDER — ELIQUIS 5 MG PO TABS
5 mg | Freq: Two times a day (BID) | ORAL | Status: SS
Start: 2021-05-20 — End: ?

## 2021-05-20 MED ORDER — SIMVASTATIN 20 MG PO TABS
20 mg | ORAL_TABLET | Freq: Every evening | ORAL | Status: SS
Start: 2021-05-20 — End: ?

## 2021-05-21 MED ORDER — GUAIFENESIN 200 MG/10ML PO SOLN UD
200 mg | ORAL | Status: CP | PRN
Start: 2021-05-21 — End: ?

## 2021-05-22 MED ORDER — AMIKACIN IVPB JX
420 mg | Freq: Once | INTRAVENOUS | Status: DC
Start: 2021-05-22 — End: 2021-05-22

## 2021-05-22 MED ORDER — METRONIDAZOLE 500 MG PO TABS
500 mg | Freq: Two times a day (BID) | ORAL | 0 refills | Status: CP
Start: 2021-05-22 — End: ?

## 2021-05-22 MED ORDER — AMIKACIN IVPB JX
15 mg/kg | Freq: Once | INTRAVENOUS | Status: CP
Start: 2021-05-22 — End: ?

## 2021-05-22 MED ORDER — LINEZOLID 600 MG PO TABS
600 mg | Freq: Two times a day (BID) | ORAL | 0 refills | Status: CP
Start: 2021-05-22 — End: ?

## 2021-05-22 MED ORDER — DRONABINOL 2.5 MG PO CAPS
2.5 mg | Freq: Two times a day (BID) | ORAL | Status: DC
Start: 2021-05-22 — End: 2021-05-23

## 2021-05-22 MED ORDER — ATORVASTATIN CALCIUM 10 MG PO TABS
10 mg | Freq: Every evening | ORAL | 1 refills | 90.00000 days | Status: CP
Start: 2021-05-22 — End: ?

## 2021-05-22 MED ORDER — DIVALPROEX SODIUM 500 MG PO TBEC
500 mg | Freq: Two times a day (BID) | ORAL | 1 refills | Status: CP
Start: 2021-05-22 — End: ?

## 2021-05-22 MED ORDER — MIDODRINE HCL 2.5 MG PO TABS
2.5 mg | Freq: Three times a day (TID) | ORAL | 0 refills | 30.00 days | Status: CP
Start: 2021-05-22 — End: ?

## 2021-11-22 DEATH — deceased
